# Patient Record
Sex: Female | Born: 1950 | Race: Black or African American | Hispanic: No | Marital: Married | Smoking: Former smoker
Health system: Southern US, Community
[De-identification: ages and names within clinical notes are randomized; demographics above are authoritative.]

## PROBLEM LIST (undated history)

## (undated) DIAGNOSIS — E669 Obesity, unspecified: Secondary | ICD-10-CM

## (undated) DIAGNOSIS — F32A Depression, unspecified: Secondary | ICD-10-CM

## (undated) DIAGNOSIS — R269 Unspecified abnormalities of gait and mobility: Secondary | ICD-10-CM

## (undated) DIAGNOSIS — I119 Hypertensive heart disease without heart failure: Secondary | ICD-10-CM

## (undated) DIAGNOSIS — E785 Hyperlipidemia, unspecified: Secondary | ICD-10-CM

## (undated) DIAGNOSIS — T4145XA Adverse effect of unspecified anesthetic, initial encounter: Secondary | ICD-10-CM

## (undated) DIAGNOSIS — K219 Gastro-esophageal reflux disease without esophagitis: Secondary | ICD-10-CM

## (undated) DIAGNOSIS — T8859XA Other complications of anesthesia, initial encounter: Secondary | ICD-10-CM

## (undated) DIAGNOSIS — G35 Multiple sclerosis: Secondary | ICD-10-CM

## (undated) DIAGNOSIS — I10 Essential (primary) hypertension: Secondary | ICD-10-CM

## (undated) DIAGNOSIS — G8929 Other chronic pain: Secondary | ICD-10-CM

## (undated) DIAGNOSIS — I422 Other hypertrophic cardiomyopathy: Secondary | ICD-10-CM

## (undated) DIAGNOSIS — R600 Localized edema: Secondary | ICD-10-CM

## (undated) DIAGNOSIS — M545 Other chronic pain: Secondary | ICD-10-CM

## (undated) DIAGNOSIS — R609 Edema, unspecified: Secondary | ICD-10-CM

## (undated) DIAGNOSIS — E049 Nontoxic goiter, unspecified: Secondary | ICD-10-CM

## (undated) DIAGNOSIS — J449 Chronic obstructive pulmonary disease, unspecified: Secondary | ICD-10-CM

## (undated) DIAGNOSIS — Z9889 Other specified postprocedural states: Secondary | ICD-10-CM

## (undated) DIAGNOSIS — E039 Hypothyroidism, unspecified: Secondary | ICD-10-CM

## (undated) DIAGNOSIS — F329 Major depressive disorder, single episode, unspecified: Secondary | ICD-10-CM

## (undated) DIAGNOSIS — N319 Neuromuscular dysfunction of bladder, unspecified: Secondary | ICD-10-CM

## (undated) DIAGNOSIS — J189 Pneumonia, unspecified organism: Secondary | ICD-10-CM

## (undated) DIAGNOSIS — I5032 Chronic diastolic (congestive) heart failure: Secondary | ICD-10-CM

## (undated) DIAGNOSIS — M1990 Unspecified osteoarthritis, unspecified site: Secondary | ICD-10-CM

## (undated) DIAGNOSIS — R0600 Dyspnea, unspecified: Secondary | ICD-10-CM

## (undated) HISTORY — DX: Edema, unspecified: R60.9

## (undated) HISTORY — DX: Dyspnea, unspecified: R06.00

## (undated) HISTORY — DX: Obesity, unspecified: E66.9

## (undated) HISTORY — DX: Low back pain: M54.5

## (undated) HISTORY — PX: JOINT REPLACEMENT: SHX530

## (undated) HISTORY — DX: Neuromuscular dysfunction of bladder, unspecified: N31.9

## (undated) HISTORY — DX: Major depressive disorder, single episode, unspecified: F32.9

## (undated) HISTORY — DX: Localized edema: R60.0

## (undated) HISTORY — DX: Hyperlipidemia, unspecified: E78.5

## (undated) HISTORY — DX: Hypothyroidism, unspecified: E03.9

## (undated) HISTORY — DX: Other specified postprocedural states: Z98.890

## (undated) HISTORY — PX: APPENDECTOMY: SHX54

## (undated) HISTORY — DX: Depression, unspecified: F32.A

## (undated) HISTORY — PX: BREAST SURGERY: SHX581

## (undated) HISTORY — DX: Unspecified abnormalities of gait and mobility: R26.9

## (undated) HISTORY — DX: Other chronic pain: M54.50

## (undated) HISTORY — PX: CHOLECYSTECTOMY: SHX55

## (undated) HISTORY — DX: Other chronic pain: G89.29

## (undated) HISTORY — DX: Chronic diastolic (congestive) heart failure: I50.32

---

## 1993-06-06 HISTORY — PX: ARTHOSCOPIC ROTAOR CUFF REPAIR: SHX5002

## 1998-09-14 ENCOUNTER — Other Ambulatory Visit: Admission: RE | Admit: 1998-09-14 | Discharge: 1998-09-14 | Payer: Self-pay | Admitting: Obstetrics and Gynecology

## 1998-11-25 ENCOUNTER — Ambulatory Visit (HOSPITAL_COMMUNITY): Admission: RE | Admit: 1998-11-25 | Discharge: 1998-11-25 | Payer: Self-pay | Admitting: *Deleted

## 1998-11-25 ENCOUNTER — Encounter: Payer: Self-pay | Admitting: *Deleted

## 1998-12-10 ENCOUNTER — Ambulatory Visit (HOSPITAL_COMMUNITY): Admission: RE | Admit: 1998-12-10 | Discharge: 1998-12-10 | Payer: Self-pay | Admitting: *Deleted

## 1998-12-10 ENCOUNTER — Encounter: Payer: Self-pay | Admitting: *Deleted

## 1998-12-24 ENCOUNTER — Ambulatory Visit (HOSPITAL_COMMUNITY): Admission: RE | Admit: 1998-12-24 | Discharge: 1998-12-24 | Payer: Self-pay | Admitting: *Deleted

## 1998-12-24 ENCOUNTER — Encounter: Payer: Self-pay | Admitting: *Deleted

## 1999-01-07 ENCOUNTER — Ambulatory Visit (HOSPITAL_COMMUNITY): Admission: RE | Admit: 1999-01-07 | Discharge: 1999-01-07 | Payer: Self-pay | Admitting: *Deleted

## 1999-01-07 ENCOUNTER — Encounter: Payer: Self-pay | Admitting: *Deleted

## 2001-09-21 ENCOUNTER — Emergency Department (HOSPITAL_COMMUNITY): Admission: EM | Admit: 2001-09-21 | Discharge: 2001-09-21 | Payer: Self-pay | Admitting: Emergency Medicine

## 2003-08-15 ENCOUNTER — Ambulatory Visit (HOSPITAL_COMMUNITY): Admission: RE | Admit: 2003-08-15 | Discharge: 2003-08-15 | Payer: Self-pay | Admitting: *Deleted

## 2003-08-15 ENCOUNTER — Ambulatory Visit (HOSPITAL_BASED_OUTPATIENT_CLINIC_OR_DEPARTMENT_OTHER): Admission: RE | Admit: 2003-08-15 | Discharge: 2003-08-15 | Payer: Self-pay | Admitting: *Deleted

## 2003-08-15 ENCOUNTER — Encounter (INDEPENDENT_AMBULATORY_CARE_PROVIDER_SITE_OTHER): Payer: Self-pay | Admitting: *Deleted

## 2003-12-06 ENCOUNTER — Encounter: Admission: RE | Admit: 2003-12-06 | Discharge: 2003-12-06 | Payer: Self-pay | Admitting: Orthopaedic Surgery

## 2003-12-06 ENCOUNTER — Inpatient Hospital Stay (HOSPITAL_COMMUNITY): Admission: AC | Admit: 2003-12-06 | Discharge: 2003-12-12 | Payer: Self-pay

## 2003-12-09 ENCOUNTER — Encounter (INDEPENDENT_AMBULATORY_CARE_PROVIDER_SITE_OTHER): Payer: Self-pay | Admitting: Cardiology

## 2004-01-01 ENCOUNTER — Encounter: Admission: RE | Admit: 2004-01-01 | Discharge: 2004-03-31 | Payer: Self-pay | Admitting: Cardiology

## 2004-04-23 ENCOUNTER — Encounter (INDEPENDENT_AMBULATORY_CARE_PROVIDER_SITE_OTHER): Payer: Self-pay | Admitting: *Deleted

## 2004-04-23 ENCOUNTER — Ambulatory Visit (HOSPITAL_COMMUNITY): Admission: RE | Admit: 2004-04-23 | Discharge: 2004-04-23 | Payer: Self-pay | Admitting: Obstetrics and Gynecology

## 2004-05-13 ENCOUNTER — Encounter: Admission: RE | Admit: 2004-05-13 | Discharge: 2004-08-11 | Payer: Self-pay | Admitting: Cardiology

## 2004-06-15 ENCOUNTER — Inpatient Hospital Stay (HOSPITAL_COMMUNITY): Admission: AD | Admit: 2004-06-15 | Discharge: 2004-06-15 | Payer: Self-pay | Admitting: Obstetrics and Gynecology

## 2004-07-07 ENCOUNTER — Encounter: Admission: RE | Admit: 2004-07-07 | Discharge: 2004-10-05 | Payer: Self-pay | Admitting: Cardiology

## 2004-10-13 ENCOUNTER — Encounter: Admission: RE | Admit: 2004-10-13 | Discharge: 2005-01-11 | Payer: Self-pay | Admitting: Cardiology

## 2005-03-10 ENCOUNTER — Encounter: Admission: RE | Admit: 2005-03-10 | Discharge: 2005-06-08 | Payer: Self-pay | Admitting: Cardiology

## 2005-11-19 ENCOUNTER — Encounter: Admission: RE | Admit: 2005-11-19 | Discharge: 2005-11-19 | Payer: Self-pay | Admitting: Orthopaedic Surgery

## 2005-12-03 ENCOUNTER — Encounter: Admission: RE | Admit: 2005-12-03 | Discharge: 2005-12-03 | Payer: Self-pay | Admitting: Orthopaedic Surgery

## 2005-12-12 DIAGNOSIS — R0600 Dyspnea, unspecified: Secondary | ICD-10-CM

## 2005-12-12 HISTORY — DX: Dyspnea, unspecified: R06.00

## 2006-01-19 ENCOUNTER — Ambulatory Visit (HOSPITAL_COMMUNITY): Admission: RE | Admit: 2006-01-19 | Discharge: 2006-01-19 | Payer: Self-pay | Admitting: Neurology

## 2006-06-20 ENCOUNTER — Encounter: Admission: RE | Admit: 2006-06-20 | Discharge: 2006-07-13 | Payer: Self-pay | Admitting: Neurology

## 2007-01-11 ENCOUNTER — Ambulatory Visit (HOSPITAL_BASED_OUTPATIENT_CLINIC_OR_DEPARTMENT_OTHER): Admission: RE | Admit: 2007-01-11 | Discharge: 2007-01-11 | Payer: Self-pay | Admitting: Orthopedic Surgery

## 2007-01-11 ENCOUNTER — Encounter (INDEPENDENT_AMBULATORY_CARE_PROVIDER_SITE_OTHER): Payer: Self-pay | Admitting: Orthopedic Surgery

## 2007-01-31 ENCOUNTER — Encounter: Admission: RE | Admit: 2007-01-31 | Discharge: 2007-01-31 | Payer: Self-pay | Admitting: Internal Medicine

## 2007-01-31 ENCOUNTER — Encounter (INDEPENDENT_AMBULATORY_CARE_PROVIDER_SITE_OTHER): Payer: Self-pay | Admitting: Interventional Radiology

## 2007-01-31 ENCOUNTER — Other Ambulatory Visit: Admission: RE | Admit: 2007-01-31 | Discharge: 2007-01-31 | Payer: Self-pay | Admitting: Interventional Radiology

## 2007-03-27 ENCOUNTER — Encounter: Admission: RE | Admit: 2007-03-27 | Discharge: 2007-03-27 | Payer: Self-pay | Admitting: Cardiology

## 2007-04-02 ENCOUNTER — Ambulatory Visit (HOSPITAL_COMMUNITY): Admission: RE | Admit: 2007-04-02 | Discharge: 2007-04-02 | Payer: Self-pay | Admitting: Cardiology

## 2007-04-02 HISTORY — PX: CARDIAC CATHETERIZATION: SHX172

## 2007-10-08 ENCOUNTER — Inpatient Hospital Stay (HOSPITAL_COMMUNITY): Admission: AD | Admit: 2007-10-08 | Discharge: 2007-10-10 | Payer: Self-pay | Admitting: Cardiology

## 2009-08-18 ENCOUNTER — Ambulatory Visit (HOSPITAL_COMMUNITY): Admission: RE | Admit: 2009-08-18 | Discharge: 2009-08-18 | Payer: Self-pay | Admitting: Internal Medicine

## 2010-07-19 ENCOUNTER — Ambulatory Visit
Admission: RE | Admit: 2010-07-19 | Discharge: 2010-07-19 | Disposition: A | Discharge status: Home / Self Care | Payer: 59 | Source: Ambulatory Visit | Attending: Nephrology | Admitting: Nephrology

## 2010-07-19 ENCOUNTER — Other Ambulatory Visit: Payer: Self-pay | Admitting: Nephrology

## 2010-07-19 DIAGNOSIS — M25579 Pain in unspecified ankle and joints of unspecified foot: Secondary | ICD-10-CM

## 2010-10-19 NOTE — Cardiovascular Report (Signed)
[name] Fry, [name] Fry              ACCOUNT NO.:  [id number]   MEDICAL RECORD NO.:  [id number]          PATIENT TYPE:  OIB   LOCATION:  NA                           FACILITY:  [facility]   PHYSICIAN:  Thereasa Solo. Little, M.D. DATE OF BIRTH:  Jun 04, 1951   DATE OF PROCEDURE:  04/02/2007  DATE OF DISCHARGE:                            CARDIAC CATHETERIZATION   This 60 year old hypertensive, obese female with diet controlled  diabetes has continued to complain of episodes of chest discomfort.  Because of these continued complaints, she is brought in for cardiac  catheterization.  Nonselective evaluation of her renal arteries was also  performed because of severe hypertension.   PROCEDURE:  The patient was prepped and draped in the usual sterile  fashion, __________  right groin.  Following local anesthetic with 1%  Xylocaine, the Seldinger technique was employed and a 5-French  introducer sheath was placed in the right femoral artery.  Selective  right and left coronary arteriography, ventriculography in the RAO  projection and nonselective renal angiograms were performed using a  right coronary catheter.   TOTAL CONTRAST:  100 mL.   COMPLICATIONS:  None.   RESULTS:  1. Hemodynamic monitoring:  Central aortic pressure was 179/101.  At      the end the procedure, however, it was 174/76.  Left ventricular      pressure was 174/14 and there was no aortic valve gradient noted at      the time of pullback.  2. Ventriculography.  Ventriculography in the RAO projection using 25      mL of contrast at 12 mL per second was associated with marked      ventricular ectopy.  The ejection fraction was in excess of 60%.      The left ventricular wall thickness appeared to be moderately      increased and the left ventricular end-diastolic pressure was 22.  3. Coronary arteriography, no calcification was seen on fluoroscopy.      a.     Left main normal.  It bifurcated.      b.     LAD:  The LAD extended  down to the apex of the heart, had       two diagonal branches.  This system was free of disease.      c.     Circumflex:  Circumflex has three OM vessels all of which       were free of disease.      d.     Right coronary artery.  This was a small vessel with a bend       in the midportion of the RCA.  In this bending area, there was a       30% area of eccentric narrowing.  The PDA was small.  There was       clearly no critical disease in this vessel.   The left renal artery was normal.  The right renal artery was normal.   CONCLUSION:  1. Left ventricular hypertrophy.  2. Normal left ventricular systolic function.  3. No coronary disease.  4. No renal  artery stenosis.   It should be pointed out that her fasting blood sugar is 175.  She  states she is diet-controlled.  I have asked her to follow back up with  Dr. Ronne Binning for treatment of her diabetes.           ______________________________  Thereasa Solo Little, M.D.     ABL/MEDQ  D:  04/02/2007  T:  04/02/2007  Job:  [id number]   cc:   Dr. Ronne Binning

## 2010-10-19 NOTE — H&P (Signed)
[name] Fry, [name] Fry              ACCOUNT NO.:  [id number]   MEDICAL RECORD NO.:  [id number]          PATIENT TYPE:  INP   LOCATION:  4708                         FACILITY:  [facility]   PHYSICIAN:  Thereasa Solo. Little, M.D. DATE OF BIRTH:  01-27-1951   DATE OF ADMISSION:  10/08/2007  DATE OF DISCHARGE:                              HISTORY & PHYSICAL   HISTORY OF PRESENT ILLNESS:  [name] Fry is 60 year old female followed  by Dr. Clarene Duke with a history of hypertension that has been difficult to  control.  She has been on multiple medications in the past, she has had  some problems with intolerance to several different medications.  She  recently saw Dr. Clarene Duke in the office August 27, 2007 for follow-up of  her blood pressure.  At that time, her pressure was still 190/120.  She  was otherwise asymptomatic.  She said that she did comply with Dr.  Fredirick Maudlin instruction regarding her medications.  At that office visit,  Dr. Clarene Duke increased her Toprol to 100 mg a day, Tekturna to 300 mg a  day, lisinopril to 20 mg b.i.d. and left her Exforge at 10/320.  She was  seen in the office today for follow-up blood pressure check.  She again  denies any chest pain, shortness of breath, or headache.  Her pressure  today is 220/120.  She is admitted now for further evaluation.  She  again indicates that she has been compliant with all medications as  directed.   Her past medical history is remarkable for normal coronaries and normal  LV function with LVH in October of 2008.  There was no renal artery  stenosis at that time.  She has morbid obesity.  She has had some  chronic back pain issues.  She has a history of goiter, and this is  being followed by an endocrinologist.  She has had a previous  hysterectomy and previous ganglion.   CURRENT MEDICATIONS:  1. Toprol XL 100 mg a day.  2. Tekturna 300 mg a day.  3. Lisinopril 20 mg b.i.d.  4. Exforge 10/320 daily.  5. Lasix 40 mg a day.  6. Potassium 20  mEq a day.  7. Albuterol inhaler p.r.n.  8. Arthrotec 75 mg b.i.d.  9. Trazodone 200 mg h.s.  10.Cymbalta 60 mg daily.  11.Azmacort inhaler 2 puffs b.i.d.  12.Aspirin 81 mg a day.  13.Detrol LA 4 mg a day.  14.Flexeril 10 mg t.i.d. p.r.n.  15.Neurontin 300 mg 1 in the morning and 2 at night.  16.Synthroid 0.088 mg alternating with Synthroid 0.112 mg every other      day.  17.Tramadol p.r.n.  18.Tapazole 10 mg a day.   SHE IS ALLERGIC TO PENICILLIN AND INTOLERANT TO CODEINE AND HAS HAD A  REACTION TO PINK ADHESIVE TAPE.   SOCIAL HISTORY:  She is married.  She has three children and five  grandchildren.  She quit smoking in 2006.   FAMILY HISTORY:  Is remarkable that her father died at 71 of an MI.  Her  mother had diabetes and died in her 7s of complications  diabetes and  coronary disease.   REVIEW OF SYSTEMS:  She does have a history of multiple sclerosis  although this appears to be fairly mild.  She uses a cane.   PHYSICAL EXAMINATION:  Blood pressure 222/122, respirations 16, pulse  78.  In general, she is an obese, African-American female in no acute  distress.  HEENT:  Normocephalic.  NECK:  Is without JVD.  She does have a goiter palpable.  CHEST:  Reveals clear lung fields.  CARDIAC EXAM:  Reveals regular rate and rhythm with positive S4.  ABDOMEN:  Is morbidly obese, nontender.  EXTREMITIES:  Without edema.  NEUROLOGIC EXAM:  Is grossly intact.  She is awake, alert and oriented  and cooperative.  Moves all extremities without obvious deficit.  SKIN:  Is warm and dry.   EKG and labs are pending.   IMPRESSION:  1. Hypertension with uncontrolled hypertensive crisis.  2. Normal coronaries and normal left ventricular function by      catheterization in October of 2008.  3. Normal renal arteries October 2008.  4. History of goiter.  5. History of multiple sclerosis.  6. Prior history of substance abuse including cocaine but none in      several years.  7. Past  history of chronic back problems.  8. Remote smoker.   PLAN:  The patient is admitted to telemetry.  Will continue her home  medicines.  Will try a dose of clonidine, although she apparently has  had problems with clonidine in the past.      Abelino Derrick, P.A.    ______________________________  Thereasa Solo. Little, M.D.    Lenard Lance  D:  10/08/2007  T:  10/08/2007  Job:  [id number]

## 2010-10-19 NOTE — Op Note (Signed)
NAMEMAHAILA, Fry              ACCOUNT NO.:  [id number]   MEDICAL RECORD NO.:  [id number]          PATIENT TYPE:  AMB   LOCATION:  DSC                          FACILITY:  [facility]   PHYSICIAN:  Katy Fitch. Sypher, M.D. DATE OF BIRTH:  Dec 29, 1950   DATE OF PROCEDURE:  01/11/2007  DATE OF DISCHARGE:                               OPERATIVE REPORT   PREOPERATIVE DIAGNOSIS:  Volar cystic mass, left wrist.   POSTOPERATIVE DIAGNOSIS:  Volar cystic mass, left wrist with what  appeared to be a subfascial extraretinacular mass extending from the  ulnar artery and nerve on the ulnar aspect of the wrist to the thenar  eminence.   OPERATIONS:  Excision of a complex left volar cystic mass consistent  with a myxoma or complex ganglion.   OPERATIONS:  [name] Fry, M.D.   ASSISTANT:  Molly Maduro Dasnoit PA-C.   ANESTHESIA:  General endotracheal.   SUPERVISING ANESTHESIOLOGIST:  Janetta Hora. Gelene Mink, M.D.   INDICATIONS:  [name] Fry is a 60 year old woman referred through the  courtesy of Dr. Billee Cashing for evaluation of an enlarging mass on  the volar aspect of her left wrist.  This had the features of a probable  complex ganglion or myxoma.  Due to its presence in the region of the  median nerve, flexor tendons, ulnar nerve and artery and thenar  eminence, I obtained a preoperative MRI.  [name] Fry was noted to have  an occult ganglion cyst in the radial aspect of the lunate adjacent to  the scapholunate interosseous ligament that did not appear to  communicate with the mass.  She had some degenerative change at the base  of her thumb, but no direct communication to the cystic mass.  The mass  appeared to be superficial to the flexor retinaculum, but extended to  the ulnar artery and nerve.   She is brought to the operating at this time anticipating exploration  and excision of the mass.   DESCRIPTION OF PROCEDURE:  [name] Fry is brought to the operating  room and placed in the  supine position on the operating table.   Following anesthesia consult with Dr. Gelene Mink, general anesthesia by  endotracheal technique was recommended and accepted.   [name] Fry was brought to room eight, placed in the supine position on the  operating table and under Dr. Thornton Dales direct supervision general  endotracheal anesthesia induced.   The left arm was prepped with Betadine soap solution and sterilely  draped.  A pneumatic tourniquet was applied to the proximal left  brachium.  Following exsanguination of left arm with an Esmarch bandage,  the  arterial tourniquet was inflated to 270 mmHg.   Procedure commenced with a Brunner zigzag incision exposing the mass  from proximal to distal.  The subcutaneous tissues were carefully  divided taking care to gently dissect the mass away from the ulnar  artery and nerve.  This was adherent to the palmaris longus tendon, yet  superficial to the flexor retinaculum.   The palmar cutaneous branch of the median nerve was identified and  gently dissected off the mass.  The mass was  circumferentially  dissected, drained of its contents and removed from the flexor  retinaculum.   Bleeding points were electrocauterized with bipolar current.  The wound  was carefully palpated looking for extensions.  I could not identify an  extension deep to the thenar musculature.   The wound was then repaired with corner sutures of 4-0 Vicryl and  intradermal 3-0 Prolene segmental sutures.  Steri-Strips were applied.  There were no apparent complications.   For aftercare [name] Fry is provided prescription for Vicodin 5 mg 1  p.o. q.4-6h. p.r.n. pain 20 tablets without refill.      Katy Fitch Sypher, M.D.  Electronically Signed     RVS/MEDQ  D:  01/11/2007  T:  01/11/2007  Job:  [id number]   cc:   Lorelle Formosa, M.D.  Katy Fitch Sypher, M.D.

## 2010-10-19 NOTE — Discharge Summary (Signed)
[name] Fry, [name] Fry              ACCOUNT NO.:  [id number]   MEDICAL RECORD NO.:  [id number]          PATIENT TYPE:  INP   LOCATION:  4708                         FACILITY:  [facility]   PHYSICIAN:  Darcella Gasman. Ingold, N.P.  DATE OF BIRTH:  1950-12-13   DATE OF ADMISSION:  10/08/2007  DATE OF DISCHARGE:  10/10/2007                               DISCHARGE SUMMARY   DISCHARGE DIAGNOSES:  1. Uncontrolled hypertension.  2. Diabetes mellitus type 2 with hyperglycemia.  3. Thyroid goiter, followed by Dr. Chestine Spore.  4. Morbid obesity.  5. History of normal coronary arteries and normal left ventricle      function October 2008.  6. Ex-smoker.  7. Multiple sclerosis followed by Dr. Anne Hahn.   DISCHARGE CONDITION:  Improved.   PROCEDURES:  None.   DISCHARGE MEDICATIONS:  1. Neurontin 300 mg 1 in the morning 2 in the evening.  2. Provigil 200 mg daily.  3. Toprol-XL 100 mg daily.  4. Tekturna 300 mg daily.  5. Lisinopril 20 mg 1 twice a day.  6. Furosemide 40 mg daily.  7. K-Dur 20 mEq daily.  8. Trazodone 200 mg at bedtime.  9. Cymbalta 60 mg daily.  10.Aspirin 81 mg daily.  11.Detrol 4 mg daily.  12.Tapazole 10 mg daily.  13.Synthroid 88 mcg alternating with 112 mcg every other day.  14.Exforge 10/320 one daily.  15.BiDil 20 mg/37.5mg  one tablet 3 times a day.  16.Flovent inhaler or Azmacort, 1 puff on Flovent twice a day or 2      puffs on Azmacort twice a day.  17.Albuterol inhaler as needed as before.  18.Flexeril 10 mg as before.  19.Tramadol 50 mg every 8 hours as needed as before.  20.Arthrotec 75 mg 1 twice a day.  21.Valium 5 mg as needed as before.   DISCHARGE INSTRUCTIONS:  1. Low-sodium heart-healthy diabetic diet.  2. Activity without restrictions.  3. Follow up with Dr. Clarene Duke Oct 26, 2007, at 3:00 p.m.  4. Follow up with Dr. Chestine Spore on Friday as previously scheduled for      endocrine including diabetes.   HISTORY OF PRESENT ILLNESS:  A 60 year old patient of Dr.  Fredirick Maudlin with  uncontrolled hypertension was seen in the office by EMS August 27, 2007,  with blood pressure of 222/120.  Medicines were adjusted, and she was  brought back today and in the office, her blood pressure was again 120  diastolic.  She denied chest pain, shortness of breath, or headache.  She states she has been strictly compliant with Dr. Fredirick Maudlin medication  instructions.  We admitted her to [facility] for further evaluation.  Outpatient meds are essentially the same.  We only added BiDil to her  treatment here in the hospital.   ALLERGIES:  PENICILLIN, CODEINE, and TAPE.   FAMILY HISTORY, SOCIAL HISTORY, REVIEW OF SYSTEMS:  See H&P.   PHYSICAL EXAMINATION ON DISCHARGE:  VITAL SIGNS:  Blood pressure 122/77,  pulse 60, respirations stable, and temperature 97.5.  CHEST:  Lungs are clear.  HEART:  Regular rate and rhythm on the monitor shows sinus rhythm.   LABORATORY DATA:  Hemoglobin 13.2, hematocrit 39.1, WBC 9.9, platelets  107, and MCV 93.6.  Chemistry; sodium 139, potassium 3.7, chloride 103,  CO2 29, BUN 5, creatinine 0.79, glucose range 259 to 177, protime 12.8,  INR 0.9, PTT 27, AST 25, ALT 27, alkaline phos 79, total bili 0.6,  albumin 3.3, calcium 8.9, magnesium 2.0, and TSH was 0.894.  Accu-Cheks  here ranged 274 to 183.   HOSPITAL COURSE:  [name] Fry was admitted due to uncontrolled  hypertension when she was seen in the office.  She was admitted and  started on BiDil with improvement of her blood pressure and it was  controlled at discharge.  Question compliant, she states she is  compliant.  We will attempt to see if the interim care can go out and  help her with her meds also, her pharmacy do a calendar to see if that  will help her.  She is on multiple medications and maybe she forgets  medications at times.  She will followup here.  Her glucose was poorly  controlled as well.  She is to see Dr. Chestine Spore, her endocrinologist on  Friday, and he will  address her diabetes as well as her thyroid issues.  We have asked her to maintain a diabetic diet.  The patient was seen and  examined on Oct 10, 2007, by Dr. Lynnea Ferrier and was decided ready for  discharge home.      Darcella Gasman. Annie Paras, N.P.     LRI/MEDQ  D:  10/10/2007  T:  10/11/2007  Job:  [id number]   cc:   Margaretmary Bayley, M.D.  Thereasa Solo. Little, M.D.

## 2010-10-20 ENCOUNTER — Other Ambulatory Visit: Payer: Self-pay | Admitting: Internal Medicine

## 2010-10-20 DIAGNOSIS — E042 Nontoxic multinodular goiter: Secondary | ICD-10-CM

## 2010-10-22 NOTE — Op Note (Signed)
[name] Fry, [name] Fry              ACCOUNT NO.:  [id number]   MEDICAL RECORD NO.:  [id number]          PATIENT TYPE:  AMB   LOCATION:  SDC                           FACILITY:  [facility]   PHYSICIAN:  Lenoard Aden, M.D.DATE OF BIRTH:  Oct 18, 1950   DATE OF PROCEDURE:  04/23/2004  DATE OF DISCHARGE:                                 OPERATIVE REPORT   PREOPERATIVE DIAGNOSIS:  Postmenopausal bleeding.   POSTOPERATIVE DIAGNOSIS:  Multiple endometrial polyps.   PROCEDURE:  Diagnostic hysteroscopy, resectoscopic polypectomy, D&C.   SURGEON:  Lenoard Aden, M.D.   ANESTHESIA:  General.   ESTIMATED BLOOD LOSS:  Less than 50 mL.   COMPLICATIONS:  None.   DRAINS:  None.   COUNTS:  Correct.   The patient to recovery in good condition.  Fluid deficit 60 mL.   BRIEF OPERATIVE NOTE:  After being apprised of the risks of anesthesia,  infection, bleeding, injury to abdominal organs with need for repair, the  patient was brought to the operating room where she was administered general  anesthesia without complications, prepped and draped in the usual sterile  fashion, catheterized until the bladder is empty.  After achieving adequate  anesthesia, a single tooth tenaculum placed on the anterior lip of the  cervix, the cervix easily dilated up to a 31 Pratt dilator, hysteroscope  placed.  Visualization reveals multiple endometrial polyps which were  resected using a double right angle loop without difficulty.  Good  hemostasis is noted.  D&C performed with sharp curettings.  Revisualization  reveals resection of multiple polyps.  At this time, minimal bleeding is  noted.  All instruments are removed.  The patient is awakened and then  transferred to recovery in good condition.      RJT/MEDQ  D:  04/23/2004  T:  04/23/2004  Job:  [id number]

## 2010-10-22 NOTE — Discharge Summary (Signed)
[name] Fry, [name] Fry                          ACCOUNT NO.:  [id number]   MEDICAL RECORD NO.:  [id number]                   PATIENT TYPE:  INP   LOCATION:  3739                                 FACILITY:  [facility]   PHYSICIAN:  Thereasa Solo. Little, M.D.              DATE OF BIRTH:  1951-01-06   DATE OF ADMISSION:  12/06/2003  DATE OF DISCHARGE:  12/12/2003                                 DISCHARGE SUMMARY   DISCHARGE DIAGNOSES:  1. Status post hypertensive crisis, resolved.  2. Status post acute respiratory arrest with pulmonary edema, resolved.  3. Positive urine drug screen for cocaine, opioids, and marijuana.  4. Abnormal cardiac enzymes on admission.  5. Chronic back pain.  6. New onset adult diabetes mellitus.  7. Goiter.  8. Chronic obstructive pulmonary disease with ongoing tobacco use.  9. Episode of elevated liver function tests, returned to normal at time of     discharge.  10.      Hypokalemia, resolved.   HISTORY OF PRESENT ILLNESS/HOSPITAL COURSE:  This is 60 year old African  American lady who was at [facility] having MRI for chronic back pain and at that  time became very anxious, short of breath.  She was medicated with valium  for a possible anxiety but still was unable to lie down flat with extreme  shortness of breath.  Pulmonary edema was suspected, and the patient was  transferred by EMS to [facility], and our group was called for  evaluation of the patient.  She was given Lasix IV 80 mg and was started on  nitroglycerin IV.  Her blood pressure, at the time of admission, was  240/146.  On arrival in the emergency room, the patient was intubated due to  extreme shortness of breath and oxygen saturation drop.  She responded  nicely to all the IV medications, and her blood pressure dropped within the  first hour to 90 systolic.  A pulmonary consult was called, and the patient  was intubated and a right internal jugular vein catheter was placed.  The  next day her  blood pressure was 110/80, heart rate 80, temperature 98.6.  White blood cell count showed 22.4, hemoglobin was 13.7.  Chest x-ray, from  admission, showed pulmonary edema.  Her drugs screen revealed positive  opioids and cocaine, and it was suspected the vent-dependent respiratory  failure was actually induced by these illicit drugs.  The patient denied any  illicit drug use.  The leukocytosis was suspected to be reactive, and the  patient was afebrile and did not appear to be toxic.  But her hemoglobin A1c  was elevated to 7.6 and serum glucose showed 241, so she was put on sliding  scale insulin.  A 2D echo was performed, on December 09, 2003, and it showed left  ventricular ejection fraction of 55-65% and moderately increased thickness  of the left ventricular wall.  The aortic valve was  __________  and normal.  There were no abnormalities seen in the mitral valve also.  But there was an  increased relative contribution of atrial contraction to left ventricular  filling.  Her liver function tests, at the time of admission, were mildly  elevated.  SGOT was 53 and SGPT 42.  The highest value of serum glucose was  357 and a nutrition consult was ordered and it was recommended the patient  place on low carb diet with sufficient nutritional supplements.  Blood  culture, urine culture, and respiratory culture revealed negative results.  Hepatic function test, on December 09, 2003, showed normal values with SGOT 19  and SGPT 24.  Hemoglobin A1c was 7.6.  Cardiac panel revealed elevated  creatinine kinase and mildly elevated troponin with normal MB and this  troponin could be related to pulmonary edema.  Drug of abuse screen, from  December 11, 2003, showed positive marijuana metabolites.  On December 10, 2003, the  patient's telemetry showed six beats of nonsustained VT, and the patient was  asymptomatic at that time.  At the day of her discharge, she denied any  chest pain, and reported improved breathing, and  was ambulating with the  cardiac rehab on the ward without significant difficulties.  The patient was  assessed by Dr. Jenne Campus and was considered stable for discharge home.   DISCHARGE DIAGNOSES:  1. Status post acute respiratory arrest, likely drug induced.  2. Status post vent-dependent respiratory failure.  3. Newly diagnosed adult onset diabetes mellitus.  4. Status post hypertensive crisis, resolved.  5. Polysubstance abuse, drug screen positive for cocaine and opiates and     marijuana.  6. Chronic back pain.  7. Chronic obstructive pulmonary disease with history of asthma and ongoing     tobacco use.  8. Morbid obesity.  9. Status post acute pulmonary edema, resolved.  10.      Status post elevated liver function tests, resolved.  11.      Status post hypokalemia, resolved.  12.      History of goiter.  13.      Abnormal cardiac enzymes this admission likely secondary to     pulmonary edema.  14.      Status post nonsustained ventricular tachycardia, asymptomatic, no     recurrence.   MEDICATIONS:  1. Lotrel 10/20 mg every day.  2. HCTZ 12.5 mg every day.  3. Potassium chloride 20 mEq every day.   ACTIVITY:  As tolerated.   DIET:  Low-fat, low-carbohydrate, diabetic diet.   SPECIAL INSTRUCTIONS:  The patient was advised smoking cessation and  cessation of illicit drug use.   FOLLOWUP APPOINTMENTS:  She will need a followup appointment with Dr.  __________  for management of diabetes and follow up with primary M.D. for  blood pressure control and also she will be seen by Dr. Clarene Duke in our office  in followup.   She may also require rehabilitation therapy for polysubstance abuse,  although she denies any use of illicit drugs.      Raymon Mutton, P.A.                    Thereasa Solo. Little, M.D.    MK/MEDQ  D:  12/12/2003  T:  12/13/2003  Job:  [id number]   cc:   Thereasa Solo. Little, M.D.  [address]  [address]  [place]  [place] [address] Fax: [phone]

## 2010-10-22 NOTE — Op Note (Signed)
NAME:  [name] Fry, [name] Fry                        ACCOUNT NO.:  [id number]   MEDICAL RECORD NO.:  [id number]                   PATIENT TYPE:  AMB   LOCATION:  DSC                                  FACILITY:  [facility]   PHYSICIAN:  Lowell Bouton, M.D.      DATE OF BIRTH:  08-12-1950   DATE OF PROCEDURE:  08/15/2003  DATE OF DISCHARGE:  08/15/2003                                 OPERATIVE REPORT   PREOPERATIVE DIAGNOSIS:  Volar ganglion, left wrist.   POSTOPERATIVE DIAGNOSIS:  Volar ganglion, left wrist.   PROCEDURE:  Excision of volar ganglion, left wrist.   SURGEON:  Lowell Bouton, M.D.   ANESTHESIA:  General.   OPERATIVE FINDINGS:  The patient had a large ganglion that was volar to the  radial artery.  It appeared to arise from the tendon sheath of the first  dorsal compartment.   DESCRIPTION OF PROCEDURE:  Under general anesthesia with a tourniquet on the  left arm, the left hand was prepped and draped in the usual fashion.  After  exsanguinating the limb, the tourniquet was inflated to 250 mmHg.  A  longitudinal incision was made over the radial artery overlying the cyst.  Sharp dissection was carried through the subcutaneous tissues, and bleeding  points were coagulated.  Blunt dissection was carried down to the cyst, and  it was dissected out bluntly.  It appeared to arise from the first dorsal  compartment, and a portion of this was released.  The cyst was bluntly  dissected away from the artery and was not grossly adherent.  After removing  the cyst, the wound was irrigated with saline, and the tourniquet was  released.  There was no bleeding from the vessel, and so a vessel loop drain  was left in for drainage.  The subcutaneous tissue was closed with 4-0  Vicryl and the skin with a 3-0 subcuticular Prolene.  Steri-Strips were  applied followed by a sterile dressing and a volar wrist splint.  The  patient tolerated the procedure well and went to the  recovery room awake,  stable, and in good condition.                                               Lowell Bouton, M.D.    EMM/MEDQ  D:  08/15/2003  T:  08/17/2003  Job:  [id number]

## 2010-10-22 NOTE — H&P (Signed)
**Note [name] Fry** [name], [name]              ACCOUNT NO.:  [id number]   MEDICAL RECORD NO.:  [id number]          PATIENT TYPE:  AMB   LOCATION:  SDC                           FACILITY:  [facility]   PHYSICIAN:  Lenoard Aden, M.D.DATE OF BIRTH:  November 09, 1950   DATE OF ADMISSION:  04/23/2004  DATE OF DISCHARGE:                                HISTORY & PHYSICAL   CHIEF COMPLAINT:  Post menopausal bleeding.   HISTORY OF PRESENT ILLNESS:  The patient is a 60 year old African-American  female, G3, P3, who presents with structural lesion on saline  sonohysterography and post menopausal bleeding. She has multiple medications  to include Arthrotec, potassium, hydrochlorothiazide, Lotrel, trazodone,  albuterol, diazepam, Azmacort, and Toprol. She has allergies to codeine and  penicillin.   She has a history of 3 previous Cesarean sections. She has history of  unspecified hospitalizations for wrist surgery in 2005. She has had  noncontributory family history. She also has medical history remarkable for  hypertension, ulcer disease, thyroid disease, and asthma.   PHYSICAL EXAMINATION:  GENERAL:  She is an obese, African-American female in  no acute distress.  HEENT:  Normal.  LUNGS:  Clear.  HEART:  Regular rate and rhythm.  ABDOMEN:  Soft, obese, nontender.  PELVIC:  Reveals an anteflexed uterus and no adnexal masses. Small  intramural fibroids are noted and questionable endometrial lesion. Bilateral  normal ovaries.  EXTREMITIES:  Reveal no coarse neurological exam, is nonfocal.   IMPRESSION:  Post menopausal bleeding with questionable structural lesion.   PLAN:  Proceed with diagnostic hysteroscopy resectoscope. Risks of  anesthesia, infection, bleeding, intra-abdominal ___________________  complications to include bowel and bladder injury. The patient acknowledges  and wishes to proceed.      RJT/MEDQ  D:  04/22/2004  T:  04/23/2004  Job:  [id number]

## 2010-10-28 ENCOUNTER — Ambulatory Visit (HOSPITAL_COMMUNITY)
Admission: RE | Admit: 2010-10-28 | Discharge: 2010-10-28 | Disposition: A | Discharge status: Home / Self Care | Payer: 59 | Source: Ambulatory Visit | Attending: Internal Medicine | Admitting: Internal Medicine

## 2010-10-28 DIAGNOSIS — E042 Nontoxic multinodular goiter: Secondary | ICD-10-CM | POA: Insufficient documentation

## 2011-01-14 ENCOUNTER — Other Ambulatory Visit: Payer: Self-pay | Admitting: Nephrology

## 2011-01-14 ENCOUNTER — Ambulatory Visit
Admission: RE | Admit: 2011-01-14 | Discharge: 2011-01-14 | Disposition: A | Discharge status: Home / Self Care | Payer: 59 | Source: Ambulatory Visit | Attending: Nephrology | Admitting: Nephrology

## 2011-01-14 DIAGNOSIS — R05 Cough: Secondary | ICD-10-CM

## 2011-03-16 LAB — CBC
HCT: 38.9
Hemoglobin: 13.2
MCV: 93.2
RBC: 4.17
WBC: 10.3

## 2011-03-16 LAB — PLATELET COUNT: Platelets: 124 — ABNORMAL LOW

## 2011-03-21 LAB — BASIC METABOLIC PANEL
CO2: 29
Chloride: 99
Creatinine, Ser: 0.77
GFR calc non Af Amer: >60

## 2011-03-21 LAB — POCT HEMOGLOBIN-HEMACUE: Operator id: 112821

## 2011-04-21 DIAGNOSIS — M79609 Pain in unspecified limb: Secondary | ICD-10-CM | POA: Insufficient documentation

## 2011-04-21 DIAGNOSIS — Z5181 Encounter for therapeutic drug level monitoring: Secondary | ICD-10-CM | POA: Insufficient documentation

## 2011-04-21 DIAGNOSIS — R269 Unspecified abnormalities of gait and mobility: Secondary | ICD-10-CM | POA: Insufficient documentation

## 2011-10-19 ENCOUNTER — Emergency Department (HOSPITAL_COMMUNITY)
Admission: EM | Admit: 2011-10-19 | Discharge: 2011-10-19 | Disposition: A | Discharge status: Nursing Home (Includes ICF) | Payer: 59 | Source: Home / Self Care | Attending: Family Medicine | Admitting: Family Medicine

## 2011-10-19 ENCOUNTER — Emergency Department (HOSPITAL_COMMUNITY): Payer: 59

## 2011-10-19 ENCOUNTER — Emergency Department (INDEPENDENT_AMBULATORY_CARE_PROVIDER_SITE_OTHER): Payer: 59

## 2011-10-19 ENCOUNTER — Observation Stay (HOSPITAL_COMMUNITY)
Admission: EM | Admit: 2011-10-19 | Discharge: 2011-10-23 | DRG: 194 | Disposition: A | Discharge status: Home / Self Care | Payer: 59 | Source: Ambulatory Visit | Attending: Internal Medicine | Admitting: Internal Medicine

## 2011-10-19 ENCOUNTER — Encounter (HOSPITAL_COMMUNITY): Payer: Self-pay | Admitting: *Deleted

## 2011-10-19 ENCOUNTER — Encounter (HOSPITAL_COMMUNITY): Payer: Self-pay | Admitting: Emergency Medicine

## 2011-10-19 DIAGNOSIS — I472 Ventricular tachycardia, unspecified: Secondary | ICD-10-CM | POA: Diagnosis present

## 2011-10-19 DIAGNOSIS — J189 Pneumonia, unspecified organism: Principal | ICD-10-CM | POA: Diagnosis present

## 2011-10-19 DIAGNOSIS — E785 Hyperlipidemia, unspecified: Secondary | ICD-10-CM | POA: Diagnosis present

## 2011-10-19 DIAGNOSIS — Z91199 Patient's noncompliance with other medical treatment and regimen due to unspecified reason: Secondary | ICD-10-CM

## 2011-10-19 DIAGNOSIS — E1165 Type 2 diabetes mellitus with hyperglycemia: Secondary | ICD-10-CM | POA: Diagnosis present

## 2011-10-19 DIAGNOSIS — I10 Essential (primary) hypertension: Secondary | ICD-10-CM

## 2011-10-19 DIAGNOSIS — E139 Other specified diabetes mellitus without complications: Secondary | ICD-10-CM

## 2011-10-19 DIAGNOSIS — G35 Multiple sclerosis: Secondary | ICD-10-CM | POA: Diagnosis present

## 2011-10-19 DIAGNOSIS — E876 Hypokalemia: Secondary | ICD-10-CM | POA: Diagnosis present

## 2011-10-19 DIAGNOSIS — I4729 Other ventricular tachycardia: Secondary | ICD-10-CM | POA: Diagnosis present

## 2011-10-19 DIAGNOSIS — J209 Acute bronchitis, unspecified: Secondary | ICD-10-CM

## 2011-10-19 DIAGNOSIS — R5381 Other malaise: Secondary | ICD-10-CM | POA: Diagnosis present

## 2011-10-19 DIAGNOSIS — Z9119 Patient's noncompliance with other medical treatment and regimen: Secondary | ICD-10-CM

## 2011-10-19 DIAGNOSIS — E119 Type 2 diabetes mellitus without complications: Secondary | ICD-10-CM | POA: Diagnosis present

## 2011-10-19 DIAGNOSIS — J4489 Other specified chronic obstructive pulmonary disease: Secondary | ICD-10-CM | POA: Diagnosis present

## 2011-10-19 DIAGNOSIS — J449 Chronic obstructive pulmonary disease, unspecified: Secondary | ICD-10-CM | POA: Diagnosis present

## 2011-10-19 HISTORY — DX: Essential (primary) hypertension: I10

## 2011-10-19 HISTORY — DX: Multiple sclerosis: G35

## 2011-10-19 HISTORY — DX: Chronic obstructive pulmonary disease, unspecified: J44.9

## 2011-10-19 LAB — CBC
HCT: 43.2 % (ref 36.0–46.0)
MCH: 31.3 pg (ref 26.0–34.0)
MCHC: 34 g/dL (ref 30.0–36.0)
MCV: 91.9 fL (ref 78.0–100.0)
RDW: 14.4 % (ref 11.5–15.5)

## 2011-10-19 LAB — DIFFERENTIAL
Basophils Absolute: 0 10*3/uL (ref 0.0–0.1)
Basophils Relative: 0 % (ref 0–1)
Eosinophils Absolute: 0.3 10*3/uL (ref 0.0–0.7)
Lymphs Abs: 3.5 10*3/uL (ref 0.7–4.0)
Monocytes Absolute: 0.9 10*3/uL (ref 0.1–1.0)
Neutro Abs: 8.8 10*3/uL — ABNORMAL HIGH (ref 1.7–7.7)

## 2011-10-19 LAB — POCT I-STAT, CHEM 8
BUN: 14 mg/dL (ref 6–23)
Calcium, Ion: 1.19 mmol/L (ref 1.12–1.32)
Creatinine, Ser: 0.9 mg/dL (ref 0.50–1.10)
Glucose, Bld: 254 mg/dL — ABNORMAL HIGH (ref 70–99)
TCO2: 28 mmol/L (ref 0–100)

## 2011-10-19 LAB — TROPONIN I: Troponin I: <0.3 ng/mL (ref <0.30)

## 2011-10-19 MED ORDER — MOXIFLOXACIN HCL 400 MG PO TABS
400.0000 mg | ORAL_TABLET | Freq: Once | ORAL | Status: AC
  Administered 2011-10-20: 400 mg via ORAL
  Filled 2011-10-19: qty 1

## 2011-10-19 MED ORDER — SODIUM CHLORIDE 0.9 % IV BOLUS (SEPSIS)
1000.0000 mL | Freq: Once | INTRAVENOUS | Status: AC
  Administered 2011-10-19: 1000 mL via INTRAVENOUS

## 2011-10-19 MED ORDER — LABETALOL HCL 5 MG/ML IV SOLN
20.0000 mg | Freq: Once | INTRAVENOUS | Status: AC
  Administered 2011-10-20: 20 mg via INTRAVENOUS
  Filled 2011-10-19: qty 4

## 2011-10-19 MED ORDER — IOHEXOL 350 MG/ML SOLN
100.0000 mL | Freq: Once | INTRAVENOUS | Status: AC | PRN
  Administered 2011-10-19: 100 mL via INTRAVENOUS

## 2011-10-19 NOTE — ED Notes (Signed)
PT  REPORTS  SYMPTOMS  OF  COUGH  /  CONGESTED         X  10  DAYS      SHE    REPORTS  SHE  WAS  SENT  BY  DR LITTLE    FOR  EVAUL   OF  COUGH  SHE  IS  AWAKE  AND  ALERT AND  ORIENTED  SPEAKING IN  COMPLETE  SENTANCES   SITTING UP ON EXAM TABLE  IN NO  SEVERE   DISTRESS

## 2011-10-19 NOTE — ED Provider Notes (Signed)
History     CSN: [id number]  Arrival date & time 10/19/11  1820   First MD Initiated Contact with Patient 10/19/11 2128      Chief Complaint  Patient presents with  . Bronchitis    (Consider location/radiation/quality/duration/timing/severity/associated sxs/prior treatment) HPI  61 year old female with history of hypertension, asthma, COPD presents complaining of cough. Pt sts her sxs started a week ago.  Onset gradual, with associated blood-tinged sputum for the past 2 days. Symptoms associated with nausea, dry heaves, chills, and decreased appetite. Patient also complaining of dyspnea on exertion, and associated chest tightness. She denies any precipitating factors. She denies denies runny nose, sneezing, sore throat, abd pain, back pain, urinary sxs, numbness or weakness. Denies any recent surgery, prolonged bed rest, recent travel, calf pain, or leg swelling. She is a nonsmoker. Patient states she has been taking her medication as prescribed. Patient also has a history of diabetes but has not taken medication for today. Reports that she checks her blood pressure on the radial basis and is usually within normal range.  Past Medical History  Diagnosis Date  . Diabetes mellitus   . Hypertension   . Asthma   . COPD (chronic obstructive pulmonary disease)     History reviewed. No pertinent past surgical history.  No family history on file.  History  Substance Use Topics  . Smoking status: Not on file  . Smokeless tobacco: Not on file  . Alcohol Use: No    OB History    Grav Para Term Preterm Abortions TAB SAB Ect Mult Living                  Review of Systems  All other systems reviewed and are negative.    Allergies  Codeine and Penicillins  Home Medications   Current Outpatient Rx  Name Route Sig Dispense Refill  . ALISKIREN FUMARATE 300 MG PO TABS Oral Take 300 mg by mouth daily.    Marland Kitchen AMLODIPINE BESYLATE 10 MG PO TABS Oral Take 10 mg by mouth daily.    .  ATORVASTATIN CALCIUM 20 MG PO TABS Oral Take 20 mg by mouth daily.    . FUROSEMIDE 40 MG PO TABS Oral Take 40 mg by mouth daily.    . ISOSORB DINITRATE-HYDRALAZINE 20-37.5 MG PO TABS Oral Take 1 tablet by mouth 3 (three) times daily.    Marland Kitchen LISINOPRIL 20 MG PO TABS Oral Take 40 mg by mouth daily.    Marland Kitchen LOSARTAN POTASSIUM 100 MG PO TABS Oral Take 100 mg by mouth daily.    Marland Kitchen METOPROLOL SUCCINATE ER 50 MG PO TB24 Oral Take 50 mg by mouth daily. Take with or immediately following a meal.    . NEBIVOLOL HCL 10 MG PO TABS Oral Take 10 mg by mouth 2 (two) times daily.    Marland Kitchen POTASSIUM CHLORIDE CRYS ER 20 MEQ PO TBCR Oral Take 20 mEq by mouth daily.      BP 159/102  Pulse 103  Temp(Src) 98.5 F (36.9 C) (Oral)  Resp 20  SpO2 100%  Physical Exam  Nursing note and vitals reviewed. Constitutional: She appears well-developed and well-nourished. No distress.       Awake, alert, nontoxic appearance  HENT:  Head: Atraumatic.  Right Ear: External ear normal.  Left Ear: External ear normal.  Mouth/Throat: Oropharynx is clear and moist. No oropharyngeal exudate.  Eyes: Conjunctivae are normal. Right eye exhibits no discharge. Left eye exhibits no discharge.  Neck: Neck supple. No tracheal  deviation present.  Cardiovascular: Normal rate and regular rhythm.   Pulmonary/Chest: Effort normal. No stridor. No respiratory distress. She exhibits no tenderness.  Abdominal: Soft. There is no tenderness. There is no rebound.  Musculoskeletal: Normal range of motion. She exhibits no edema and no tenderness.       ROM appears intact, no obvious focal weakness  Lymphadenopathy:    She has no cervical adenopathy.  Neurological:       Mental status and motor strength appears intact  Skin: No rash noted.  Psychiatric: She has a normal mood and affect.    ED Course  Procedures (including critical care time)  Labs Reviewed - No data to display Dg Chest 2 View  10/19/2011  *RADIOLOGY REPORT*  Clinical Data: Cough  for 9 days  CHEST - 2 VIEW  Comparison: Chest x-ray of 01/14/2011  Findings: The lungs are clear.  Cardiomegaly is stable.  There is no evidence of congestive heart failure.  There are diffuse degenerative changes throughout the thoracic spine.  IMPRESSION: Cardiomegaly.  No active lung disease.  Original Report Authenticated By: Juline Patch, M.D.     No diagnosis found.  Results for orders placed during the hospital encounter of 10/19/11  POCT I-STAT, CHEM 8      Component Value Range   Sodium 140  135 - 145 (mEq/L)   Potassium 3.6  3.5 - 5.1 (mEq/L)   Chloride 104  96 - 112 (mEq/L)   BUN 14  6 - 23 (mg/dL)   Creatinine, Ser 1.19  0.50 - 1.10 (mg/dL)   Glucose, Bld 147 (*) 70 - 99 (mg/dL)   Calcium, Ion 8.29  5.62 - 1.32 (mmol/L)   TCO2 28  0 - 100 (mmol/L)   Hemoglobin 16.7 (*) 12.0 - 15.0 (g/dL)   HCT 13.0 (*) 86.5 - 46.0 (%)   Dg Chest 2 View  10/19/2011  *RADIOLOGY REPORT*  Clinical Data: Cough for 9 days  CHEST - 2 VIEW  Comparison: Chest x-ray of 01/14/2011  Findings: The lungs are clear.  Cardiomegaly is stable.  There is no evidence of congestive heart failure.  There are diffuse degenerative changes throughout the thoracic spine.  IMPRESSION: Cardiomegaly.  No active lung disease.  Original Report Authenticated By: Juline Patch, M.D.   Results for orders placed during the hospital encounter of 10/19/11  TROPONIN I      Component Value Range   Troponin I <0.30  <0.30 (ng/mL)  CBC      Component Value Range   WBC 13.5 (*) 4.0 - 10.5 (K/uL)   RBC 4.70  3.87 - 5.11 (MIL/uL)   Hemoglobin 14.7  12.0 - 15.0 (g/dL)   HCT 78.4  69.6 - 29.5 (%)   MCV 91.9  78.0 - 100.0 (fL)   MCH 31.3  26.0 - 34.0 (pg)   MCHC 34.0  30.0 - 36.0 (g/dL)   RDW 28.4  13.2 - 44.0 (%)   Platelets 155  150 - 400 (K/uL)  DIFFERENTIAL      Component Value Range   Neutrophils Relative 65  43 - 77 (%)   Lymphocytes Relative 26  12 - 46 (%)   Monocytes Relative 7  3 - 12 (%)   Eosinophils Relative 2   0 - 5 (%)   Basophils Relative 0  0 - 1 (%)   Neutro Abs 8.8 (*) 1.7 - 7.7 (K/uL)   Lymphs Abs 3.5  0.7 - 4.0 (K/uL)   Monocytes Absolute 0.9  0.1 - 1.0 (K/uL)   Eosinophils Absolute 0.3  0.0 - 0.7 (K/uL)   Basophils Absolute 0.0  0.0 - 0.1 (K/uL)   WBC Morphology ATYPICAL LYMPHOCYTES     Smear Review LARGE PLATELETS PRESENT    GLUCOSE, CAPILLARY      Component Value Range   Glucose-Capillary 207 (*) 70 - 99 (mg/dL)   Dg Chest 2 View  1/61/0960  *RADIOLOGY REPORT*  Clinical Data: Cough for 9 days  CHEST - 2 VIEW  Comparison: Chest x-ray of 01/14/2011  Findings: The lungs are clear.  Cardiomegaly is stable.  There is no evidence of congestive heart failure.  There are diffuse degenerative changes throughout the thoracic spine.  IMPRESSION: Cardiomegaly.  No active lung disease.  Original Report Authenticated By: Juline Patch, M.D.   Ct Angio Chest W/cm &/or Wo Cm  10/19/2011  *RADIOLOGY REPORT*  Clinical Data: Shortness of breath.  Chest pain.  CT ANGIOGRAPHY CHEST  Technique:  Multidetector CT imaging of the chest using the standard protocol during bolus administration of intravenous contrast. Multiplanar reconstructed images including MIPs were obtained and reviewed to evaluate the vascular anatomy.  Contrast: OMNIPAQUE IOHEXOL 350 MG/ML SOLN  Comparison: 12/06/2003; 10/19/2011.  Findings: Prominent thyroid goiter with scattered calcifications again noted, with considerably thickened isthmus.  Technical factors related to patient body habitus reduce diagnostic sensitivity and specificity.  No filling defect is identified in the pulmonary arterial tree to suggest pulmonary embolus.  No pathologic thoracic adenopathy is observed.  Interventricular septal thickness of 2.4 cm is compatible with left ventricular hypertrophy.  There is confluent nodular airspace opacity in the superior segment left lower lobe, images 37-45 of series 5, suspicious for early pneumonia.  There is some mild adjacent  atelectasis in the left lower lobe.  Diffuse hepatic steatosis noted.  IMPRESSION:  1. No filling defect is identified in the pulmonary arterial tree to suggest pulmonary embolus. 2.  Confluents of nodular airspace opacity in the left lower lobe is suspicious for early pneumonia. 3.  Left ventricular hypertrophy. 4.  Prominent thyroid goiter, as shown on prior exam of 2005. 5.  Diffuse hepatic steatosis.  Original Report Authenticated By: Dellia Cloud, M.D.     Date: 10/19/2011  Rate: 86  Rhythm: normal sinus rhythm  QRS Axis: indeterminate  Intervals: QT prolonged  ST/T Wave abnormalities: nonspecific T wave changes  Conduction Disutrbances:first-degree A-V block   Narrative Interpretation:   Old EKG Reviewed: unchanged     MDM  Patient with increased dyspnea on exertion, who is tachycardic, with elevated blood pressure, and hemoptysis. Will obtain CT angio chest to rule out PE.  Cardiac work up in 2008 was unremarkable.  Discussed with my attending.    11:10 PM Pt's BP spikes to 220 systolic.  Pt has not had her regular medication today.  No evidence of end organ damage.  Will give labetalol 20mg  IV and will continue to monitor.  Pt will likely need admission.     11:45 PM Chest CT angio shows no evidence of PE.  However it did shows evidence or early pneumonia.  Pt does have elevated WBC of 13.5.  NO recent hospitalization.  Plan to start avelox IV and have pt admitted due to uncontrolled HTN and pneumonia.    12:06 AM I have consulted with [facility], Dr. Anne Hahn, who agrees to admit pt.  Pt to be admitted to tele bed, Team 4, under the care of Dr. Cleotis Lema.  He also recommend stat BMET,  second set of troponin, and another dose of labetalol 20mg .     Fayrene Helper, PA-C 10/20/11 0032

## 2011-10-19 NOTE — ED Notes (Signed)
Pt st's she went to Urgent Care for cough and congestion.   St's they did a CXR  And lab work, dx her with COPD and Bronchitis.  St's they did not treat her due to HTN and sent her to ED

## 2011-10-19 NOTE — ED Provider Notes (Addendum)
History     CSN: [id number]  Arrival date & time 10/19/11  1446   First MD Initiated Contact with Patient 10/19/11 1706      Chief Complaint  Patient presents with  . Cough    (Consider location/radiation/quality/duration/timing/severity/associated sxs/prior treatment) Patient is a 61 y.o. female presenting with cough. The history is provided by the patient.  Cough This is a new problem. The current episode started more than 1 week ago (reports seen by dr little on 5/3 , and told on 5/13 to come to [facility], ). The problem has been gradually worsening. The cough is productive of blood-tinged sputum (for past 2 days.). There has been no fever. Pertinent negatives include no wheezing. Associated symptoms comments: Chest tightness., not taking diabetes med , has CHF and hbp and MS and goiter.    Past Medical History  Diagnosis Date  . Diabetes mellitus   . Hypertension   . Asthma   . COPD (chronic obstructive pulmonary disease)     History reviewed. No pertinent past surgical history.  No family history on file.  History  Substance Use Topics  . Smoking status: Not on file  . Smokeless tobacco: Not on file  . Alcohol Use: No    OB History    Grav Para Term Preterm Abortions TAB SAB Ect Mult Living                  Review of Systems  Constitutional: Negative.   HENT: Negative.   Respiratory: Positive for cough and chest tightness. Negative for wheezing.   Cardiovascular: Negative.   Gastrointestinal: Negative.   Musculoskeletal: Negative.     Allergies  Review of patient's allergies indicates no known allergies.  Home Medications   Current Outpatient Rx  Name Route Sig Dispense Refill  . ALISKIREN FUMARATE 300 MG PO TABS Oral Take 300 mg by mouth daily.    Marland Kitchen AMLODIPINE BESYLATE 10 MG PO TABS Oral Take 10 mg by mouth daily.    . ATORVASTATIN CALCIUM 20 MG PO TABS Oral Take 20 mg by mouth daily.    . FUROSEMIDE 40 MG PO TABS Oral Take 40 mg by mouth daily.    .  ISOSORB DINITRATE-HYDRALAZINE 20-37.5 MG PO TABS Oral Take 1 tablet by mouth 3 (three) times daily.    Marland Kitchen LISINOPRIL 20 MG PO TABS Oral Take 40 mg by mouth daily.    Marland Kitchen LOSARTAN POTASSIUM 100 MG PO TABS Oral Take 100 mg by mouth daily.    Marland Kitchen METOPROLOL SUCCINATE ER 50 MG PO TB24 Oral Take 50 mg by mouth daily. Take with or immediately following a meal.    . NEBIVOLOL HCL 10 MG PO TABS Oral Take 10 mg by mouth 2 (two) times daily.    Marland Kitchen POTASSIUM CHLORIDE CRYS ER 20 MEQ PO TBCR Oral Take 20 mEq by mouth daily.      BP 223/150  Pulse 110  Temp(Src) 98.5 F (36.9 C) (Oral)  Resp 22  SpO2 99%  Physical Exam  Nursing note and vitals reviewed. Constitutional: She is oriented to person, place, and time. She appears well-developed and well-nourished.  Neck: Normal range of motion. Neck supple. Thyromegaly present.  Cardiovascular: Normal rate, regular rhythm, S1 normal, S2 normal and intact distal pulses.   Murmur heard.  Systolic murmur is present with a grade of 2/6  Pulmonary/Chest: Effort normal and breath sounds normal.  Abdominal: There is no tenderness.  Musculoskeletal: She exhibits no edema.  Lymphadenopathy:  She has no cervical adenopathy.  Neurological: She is alert and oriented to person, place, and time.  Skin: Skin is warm and dry.    ED Course  Procedures (including critical care time)  Labs Reviewed  POCT I-STAT, CHEM 8 - Abnormal; Notable for the following:    Glucose, Bld 254 (*)    Hemoglobin 16.7 (*)    HCT 49.0 (*)    All other components within normal limits   Dg Chest 2 View  10/19/2011  *RADIOLOGY REPORT*  Clinical Data: Cough for 9 days  CHEST - 2 VIEW  Comparison: Chest x-ray of 01/14/2011  Findings: The lungs are clear.  Cardiomegaly is stable.  There is no evidence of congestive heart failure.  There are diffuse degenerative changes throughout the thoracic spine.  IMPRESSION: Cardiomegaly.  No active lung disease.  Original Report Authenticated By: Juline Patch, M.D.     1. Hypertension, uncontrolled   2. 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         Linna Hoff, MD 10/19/11 1745  Linna Hoff, MD 11/17/11 [phone]

## 2011-10-19 NOTE — ED Notes (Signed)
Pt st's she takes she takes meds for HTN but has not taken it today

## 2011-10-19 NOTE — ED Notes (Signed)
Pt complains of chills, nausea, vomiting, diarrhea, and cough since Friday. Pt denies fever.

## 2011-10-20 ENCOUNTER — Encounter (HOSPITAL_COMMUNITY): Payer: Self-pay | Admitting: Internal Medicine

## 2011-10-20 DIAGNOSIS — I10 Essential (primary) hypertension: Secondary | ICD-10-CM

## 2011-10-20 DIAGNOSIS — D696 Thrombocytopenia, unspecified: Secondary | ICD-10-CM

## 2011-10-20 DIAGNOSIS — E1165 Type 2 diabetes mellitus with hyperglycemia: Secondary | ICD-10-CM

## 2011-10-20 DIAGNOSIS — E785 Hyperlipidemia, unspecified: Secondary | ICD-10-CM | POA: Diagnosis present

## 2011-10-20 DIAGNOSIS — J189 Pneumonia, unspecified organism: Secondary | ICD-10-CM

## 2011-10-20 DIAGNOSIS — G35 Multiple sclerosis: Secondary | ICD-10-CM | POA: Diagnosis present

## 2011-10-20 DIAGNOSIS — E782 Mixed hyperlipidemia: Secondary | ICD-10-CM

## 2011-10-20 LAB — POCT I-STAT, CHEM 8
BUN: 14 mg/dL (ref 6–23)
Calcium, Ion: 1.18 mmol/L (ref 1.12–1.32)
Chloride: 104 mEq/L (ref 96–112)
Creatinine, Ser: 0.9 mg/dL (ref 0.50–1.10)
TCO2: 30 mmol/L (ref 0–100)

## 2011-10-20 LAB — CBC
HCT: 37.2 % (ref 36.0–46.0)
MCHC: 33.3 g/dL (ref 30.0–36.0)
MCV: 91.9 fL (ref 78.0–100.0)
RDW: 14.6 % (ref 11.5–15.5)

## 2011-10-20 LAB — BASIC METABOLIC PANEL
CO2: 30 mEq/L (ref 19–32)
Calcium: 9.4 mg/dL (ref 8.4–10.5)
Chloride: 100 mEq/L (ref 96–112)
Creatinine, Ser: 0.85 mg/dL (ref 0.50–1.10)
Glucose, Bld: 219 mg/dL — ABNORMAL HIGH (ref 70–99)

## 2011-10-20 LAB — COMPREHENSIVE METABOLIC PANEL
ALT: 17 U/L (ref 0–35)
Albumin: 3.1 g/dL — ABNORMAL LOW (ref 3.5–5.2)
Alkaline Phosphatase: 71 U/L (ref 39–117)
Potassium: 3.4 mEq/L — ABNORMAL LOW (ref 3.5–5.1)
Sodium: 140 mEq/L (ref 135–145)
Total Protein: 7.3 g/dL (ref 6.0–8.3)

## 2011-10-20 LAB — GLUCOSE, CAPILLARY
Glucose-Capillary: 214 mg/dL — ABNORMAL HIGH (ref 70–99)
Glucose-Capillary: 177 mg/dL — ABNORMAL HIGH (ref 70–99)

## 2011-10-20 MED ORDER — NEBIVOLOL HCL 10 MG PO TABS
10.0000 mg | ORAL_TABLET | Freq: Two times a day (BID) | ORAL | Status: DC
  Administered 2011-10-20 – 2011-10-23 (×7): 10 mg via ORAL
  Filled 2011-10-20 (×10): qty 1

## 2011-10-20 MED ORDER — LOSARTAN POTASSIUM 50 MG PO TABS
100.0000 mg | ORAL_TABLET | Freq: Every day | ORAL | Status: DC
  Administered 2011-10-20 – 2011-10-23 (×4): 100 mg via ORAL
  Filled 2011-10-20 (×4): qty 2

## 2011-10-20 MED ORDER — MOXIFLOXACIN HCL IN NACL 400 MG/250ML IV SOLN
400.0000 mg | INTRAVENOUS | Status: DC
  Administered 2011-10-21 – 2011-10-22 (×3): 400 mg via INTRAVENOUS
  Filled 2011-10-20 (×3): qty 250

## 2011-10-20 MED ORDER — POTASSIUM CHLORIDE CRYS ER 20 MEQ PO TBCR
20.0000 meq | EXTENDED_RELEASE_TABLET | Freq: Every day | ORAL | Status: DC
  Administered 2011-10-20 – 2011-10-23 (×4): 20 meq via ORAL
  Filled 2011-10-20 (×4): qty 1

## 2011-10-20 MED ORDER — AMLODIPINE BESYLATE 10 MG PO TABS
10.0000 mg | ORAL_TABLET | Freq: Every day | ORAL | Status: DC
  Administered 2011-10-20 – 2011-10-23 (×4): 10 mg via ORAL
  Filled 2011-10-20 (×4): qty 1

## 2011-10-20 MED ORDER — BENZONATATE 100 MG PO CAPS
100.0000 mg | ORAL_CAPSULE | Freq: Three times a day (TID) | ORAL | Status: DC | PRN
  Administered 2011-10-20 – 2011-10-23 (×6): 100 mg via ORAL
  Filled 2011-10-20 (×7): qty 1

## 2011-10-20 MED ORDER — INSULIN GLARGINE 100 UNIT/ML ~~LOC~~ SOLN
60.0000 [IU] | Freq: Every day | SUBCUTANEOUS | Status: DC
  Administered 2011-10-20 – 2011-10-22 (×3): 60 [IU] via SUBCUTANEOUS

## 2011-10-20 MED ORDER — SODIUM CHLORIDE 0.9 % IJ SOLN
3.0000 mL | Freq: Two times a day (BID) | INTRAMUSCULAR | Status: DC
  Administered 2011-10-21 – 2011-10-22 (×2): 3 mL via INTRAVENOUS

## 2011-10-20 MED ORDER — LABETALOL HCL 5 MG/ML IV SOLN
10.0000 mg | INTRAVENOUS | Status: DC | PRN
  Administered 2011-10-22: 20 mg via INTRAVENOUS
  Administered 2011-10-22: 10 mg via INTRAVENOUS
  Filled 2011-10-20 (×2): qty 4

## 2011-10-20 MED ORDER — ZOLPIDEM TARTRATE 5 MG PO TABS
5.0000 mg | ORAL_TABLET | Freq: Every evening | ORAL | Status: DC | PRN
  Administered 2011-10-20 – 2011-10-22 (×3): 5 mg via ORAL
  Filled 2011-10-20 (×3): qty 1

## 2011-10-20 MED ORDER — SIMVASTATIN 40 MG PO TABS
40.0000 mg | ORAL_TABLET | Freq: Every day | ORAL | Status: DC
  Filled 2011-10-20: qty 1

## 2011-10-20 MED ORDER — SODIUM CHLORIDE 0.9 % IV SOLN
INTRAVENOUS | Status: AC
  Administered 2011-10-20: 02:00:00 via INTRAVENOUS

## 2011-10-20 MED ORDER — LISINOPRIL 40 MG PO TABS
40.0000 mg | ORAL_TABLET | Freq: Every day | ORAL | Status: DC
  Administered 2011-10-20 – 2011-10-23 (×4): 40 mg via ORAL
  Filled 2011-10-20 (×4): qty 1

## 2011-10-20 MED ORDER — INSULIN ASPART 100 UNIT/ML ~~LOC~~ SOLN
0.0000 [IU] | Freq: Three times a day (TID) | SUBCUTANEOUS | Status: DC
  Administered 2011-10-20 (×2): 2 [IU] via SUBCUTANEOUS
  Administered 2011-10-20: 3 [IU] via SUBCUTANEOUS
  Administered 2011-10-21: 2 [IU] via SUBCUTANEOUS
  Administered 2011-10-21 – 2011-10-22 (×2): 1 [IU] via SUBCUTANEOUS
  Administered 2011-10-23: 2 [IU] via SUBCUTANEOUS

## 2011-10-20 MED ORDER — INSULIN GLARGINE 100 UNIT/ML ~~LOC~~ SOLN
70.0000 [IU] | Freq: Every day | SUBCUTANEOUS | Status: DC
  Administered 2011-10-20 – 2011-10-23 (×4): 70 [IU] via SUBCUTANEOUS

## 2011-10-20 MED ORDER — ALISKIREN FUMARATE 150 MG PO TABS
300.0000 mg | ORAL_TABLET | Freq: Every day | ORAL | Status: DC
  Administered 2011-10-20 – 2011-10-23 (×4): 300 mg via ORAL
  Filled 2011-10-20 (×4): qty 2

## 2011-10-20 MED ORDER — ONDANSETRON HCL 4 MG PO TABS: 4.0000 mg | ORAL_TABLET | Freq: Four times a day (QID) | ORAL | Status: DC | PRN

## 2011-10-20 MED ORDER — ACETAMINOPHEN 325 MG PO TABS
650.0000 mg | ORAL_TABLET | Freq: Four times a day (QID) | ORAL | Status: DC | PRN
Start: 2011-10-20 — End: 2011-10-23
  Administered 2011-10-23: 650 mg via ORAL
  Filled 2011-10-20: qty 2

## 2011-10-20 MED ORDER — FUROSEMIDE 40 MG PO TABS
40.0000 mg | ORAL_TABLET | Freq: Every day | ORAL | Status: DC
  Administered 2011-10-20: 40 mg via ORAL
  Filled 2011-10-20: qty 1

## 2011-10-20 MED ORDER — ACETAMINOPHEN 650 MG RE SUPP: 650.0000 mg | Freq: Four times a day (QID) | RECTAL | Status: DC | PRN

## 2011-10-20 MED ORDER — POTASSIUM CHLORIDE IN NACL 20-0.45 MEQ/L-% IV SOLN
INTRAVENOUS | Status: DC
  Administered 2011-10-20 – 2011-10-22 (×3): via INTRAVENOUS
  Filled 2011-10-20 (×5): qty 1000

## 2011-10-20 MED ORDER — METOPROLOL SUCCINATE ER 50 MG PO TB24
50.0000 mg | ORAL_TABLET | Freq: Every day | ORAL | Status: DC
  Administered 2011-10-20 – 2011-10-23 (×4): 50 mg via ORAL
  Filled 2011-10-20 (×4): qty 1

## 2011-10-20 MED ORDER — LABETALOL HCL 5 MG/ML IV SOLN
20.0000 mg | Freq: Once | INTRAVENOUS | Status: AC
Start: 2011-10-20 — End: 2011-10-20
  Administered 2011-10-20: 20 mg via INTRAVENOUS
  Filled 2011-10-20: qty 4

## 2011-10-20 MED ORDER — SODIUM CHLORIDE 0.9 % IJ SOLN
3.0000 mL | Freq: Two times a day (BID) | INTRAMUSCULAR | Status: DC
  Administered 2011-10-23: 3 mL via INTRAVENOUS

## 2011-10-20 MED ORDER — ONDANSETRON HCL 4 MG/2ML IJ SOLN: 4.0000 mg | Freq: Four times a day (QID) | INTRAMUSCULAR | Status: DC | PRN

## 2011-10-20 MED ORDER — ATORVASTATIN CALCIUM 20 MG PO TABS
20.0000 mg | ORAL_TABLET | Freq: Every day | ORAL | Status: DC
  Administered 2011-10-20 – 2011-10-22 (×3): 20 mg via ORAL
  Filled 2011-10-20 (×4): qty 1

## 2011-10-20 MED ORDER — ISOSORB DINITRATE-HYDRALAZINE 20-37.5 MG PO TABS
1.0000 | ORAL_TABLET | Freq: Three times a day (TID) | ORAL | Status: DC
  Administered 2011-10-20 – 2011-10-23 (×10): 1 via ORAL
  Filled 2011-10-20 (×12): qty 1

## 2011-10-20 MED ORDER — LABETALOL HCL 5 MG/ML IV SOLN
10.0000 mg | INTRAVENOUS | Status: DC | PRN
  Filled 2011-10-20 (×2): qty 4

## 2011-10-20 NOTE — H&P (Signed)
[name] Fry is an 61 y.o. female.   PCP/Cardiologist - Dr.Little of [facility]. Neurologist - Dr.Willis. Chief Complaint: Coughing. HPI: 61 year-old female with known history of diabetes mellitus2, hypertension, hyperlipidemia and multiple sclerosis was referred from the urgent care because of increased blood pressure. Patient has been experiencing cough with productive sputum for a week and a half. She had called her cardiologist today who advised her to go to urgent care. At the urgent care her blood pressure was found to be very high and was referred to the ER. Patient denies any chest pain or shortness of breath. Has been having productive sputum sometimes blood-tinged. In the ER patient was found to have systolic blood pressure more than 210 and diastolic blood pressures more than 110. Labetalol IV was given. And CT angiogram of the chest was done to rule out pulmonary embolism and it showed possible developing pneumonia. Patient will be admitted for further. Patient states she had ran out of her antihypertensives and has not taken it for one day.  Past Medical History  Diagnosis Date  . Diabetes mellitus   . Hypertension   . Asthma   . COPD (chronic obstructive pulmonary disease)   . Multiple sclerosis     History reviewed. No pertinent past surgical history.  History reviewed. No pertinent family history. Social History:  reports that she has never smoked. She does not have any smokeless tobacco history on file. She reports that she does not drink alcohol or use illicit drugs.  Allergies:  Allergies  Allergen Reactions  . Codeine Other (See Comments)    unknown  . Penicillins Other (See Comments)    unknown    Medications Prior to Admission  Medication Sig Dispense Refill  . aliskiren (TEKTURNA) 300 MG tablet Take 300 mg by mouth daily.      Marland Kitchen amLODipine (NORVASC) 10 MG tablet Take 10 mg by mouth daily.      Marland Kitchen atorvastatin (LIPITOR) 20 MG tablet Take 20 mg by mouth daily.        . furosemide (LASIX) 40 MG tablet Take 40 mg by mouth daily.      . isosorbide-hydrALAZINE (BIDIL) 20-37.5 MG per tablet Take 1 tablet by mouth 3 (three) times daily.      Marland Kitchen lisinopril (PRINIVIL,ZESTRIL) 20 MG tablet Take 40 mg by mouth daily.      Marland Kitchen losartan (COZAAR) 100 MG tablet Take 100 mg by mouth daily.      . metoprolol succinate (TOPROL-XL) 50 MG 24 hr tablet Take 50 mg by mouth daily. Take with or immediately following a meal.      . nebivolol (BYSTOLIC) 10 MG tablet Take 10 mg by mouth 2 (two) times daily.      . potassium chloride SA (K-DUR,KLOR-CON) 20 MEQ tablet Take 20 mEq by mouth daily.        Results for orders placed during the hospital encounter of 10/19/11 (from the past 48 hour(s))  TROPONIN I     Status: Normal   Collection Time   10/19/11 10:03 PM      Component Value Range Comment   Troponin I <0.30  <0.30 (ng/mL)   CBC     Status: Abnormal   Collection Time   10/19/11 10:03 PM      Component Value Range Comment   WBC 13.5 (*) 4.0 - 10.5 (K/uL)    RBC 4.70  3.87 - 5.11 (MIL/uL)    Hemoglobin 14.7  12.0 - 15.0 (g/dL)  HCT 43.2  36.0 - 46.0 (%)    MCV 91.9  78.0 - 100.0 (fL)    MCH 31.3  26.0 - 34.0 (pg)    MCHC 34.0  30.0 - 36.0 (g/dL)    RDW 95.6  21.3 - 08.6 (%)    Platelets 155  150 - 400 (K/uL)   DIFFERENTIAL     Status: Abnormal   Collection Time   10/19/11 10:03 PM      Component Value Range Comment   Neutrophils Relative 65  43 - 77 (%)    Lymphocytes Relative 26  12 - 46 (%)    Monocytes Relative 7  3 - 12 (%)    Eosinophils Relative 2  0 - 5 (%)    Basophils Relative 0  0 - 1 (%)    Neutro Abs 8.8 (*) 1.7 - 7.7 (K/uL)    Lymphs Abs 3.5  0.7 - 4.0 (K/uL)    Monocytes Absolute 0.9  0.1 - 1.0 (K/uL)    Eosinophils Absolute 0.3  0.0 - 0.7 (K/uL)    Basophils Absolute 0.0  0.0 - 0.1 (K/uL)    WBC Morphology ATYPICAL LYMPHOCYTES   MILD LEFT SHIFT (1-5% METAS, OCC MYELO, OCC BANDS)   Smear Review LARGE PLATELETS PRESENT     GLUCOSE, CAPILLARY      Status: Abnormal   Collection Time   10/19/11 10:15 PM      Component Value Range Comment   Glucose-Capillary 207 (*) 70 - 99 (mg/dL)   BASIC METABOLIC PANEL     Status: Abnormal   Collection Time   10/20/11 12:08 AM      Component Value Range Comment   Sodium 139  135 - 145 (mEq/L)    Potassium 4.2  3.5 - 5.1 (mEq/L)    Chloride 100  96 - 112 (mEq/L)    CO2 30  19 - 32 (mEq/L)    Glucose, Bld 219 (*) 70 - 99 (mg/dL)    BUN 14  6 - 23 (mg/dL)    Creatinine, Ser 5.78  0.50 - 1.10 (mg/dL)    Calcium 9.4  8.4 - 10.5 (mg/dL)    GFR calc non Af Amer 74 (*) >90 (mL/min)    GFR calc Af Amer 85 (*) >90 (mL/min)   TROPONIN I     Status: Normal   Collection Time   10/20/11 12:08 AM      Component Value Range Comment   Troponin I <0.30  <0.30 (ng/mL)   POCT I-STAT, CHEM 8     Status: Abnormal   Collection Time   10/20/11 12:11 AM      Component Value Range Comment   Sodium 141  135 - 145 (mEq/L)    Potassium 4.3  3.5 - 5.1 (mEq/L)    Chloride 104  96 - 112 (mEq/L)    BUN 14  6 - 23 (mg/dL)    Creatinine, Ser 4.69  0.50 - 1.10 (mg/dL)    Glucose, Bld 629 (*) 70 - 99 (mg/dL)    Calcium, Ion 5.28  1.12 - 1.32 (mmol/L)    TCO2 30  0 - 100 (mmol/L)    Hemoglobin 15.0  12.0 - 15.0 (g/dL)    HCT 41.3  24.4 - 01.0 (%)    Dg Chest 2 View  10/19/2011  *RADIOLOGY REPORT*  Clinical Data: Cough for 9 days  CHEST - 2 VIEW  Comparison: Chest x-ray of 01/14/2011  Findings: The lungs are clear.  Cardiomegaly is stable.  There is no evidence of congestive heart failure.  There are diffuse degenerative changes throughout the thoracic spine.  IMPRESSION: Cardiomegaly.  No active lung disease.  Original Report Authenticated By: Juline Patch, M.D.   Ct Angio Chest W/cm &/or Wo Cm  10/19/2011  *RADIOLOGY REPORT*  Clinical Data: Shortness of breath.  Chest pain.  CT ANGIOGRAPHY CHEST  Technique:  Multidetector CT imaging of the chest using the standard protocol during bolus administration of intravenous  contrast. Multiplanar reconstructed images including MIPs were obtained and reviewed to evaluate the vascular anatomy.  Contrast: OMNIPAQUE IOHEXOL 350 MG/ML SOLN  Comparison: 12/06/2003; 10/19/2011.  Findings: Prominent thyroid goiter with scattered calcifications again noted, with considerably thickened isthmus.  Technical factors related to patient body habitus reduce diagnostic sensitivity and specificity.  No filling defect is identified in the pulmonary arterial tree to suggest pulmonary embolus.  No pathologic thoracic adenopathy is observed.  Interventricular septal thickness of 2.4 cm is compatible with left ventricular hypertrophy.  There is confluent nodular airspace opacity in the superior segment left lower lobe, images 37-45 of series 5, suspicious for early pneumonia.  There is some mild adjacent atelectasis in the left lower lobe.  Diffuse hepatic steatosis noted.  IMPRESSION:  1. No filling defect is identified in the pulmonary arterial tree to suggest pulmonary embolus. 2.  Confluents of nodular airspace opacity in the left lower lobe is suspicious for early pneumonia. 3.  Left ventricular hypertrophy. 4.  Prominent thyroid goiter, as shown on prior exam of 2005. 5.  Diffuse hepatic steatosis.  Original Report Authenticated By: Dellia Cloud, M.D.    Review of Systems  Constitutional: Negative.   HENT: Negative.   Eyes: Negative.   Respiratory: Positive for cough and sputum production.   Cardiovascular: Negative.   Gastrointestinal: Negative.   Genitourinary: Negative.   Musculoskeletal: Negative.   Skin: Negative.   Neurological: Negative.   Endo/Heme/Allergies: Negative.   Psychiatric/Behavioral: Negative.     Blood pressure 225/119, pulse 81, temperature 97.7 F (36.5 C), temperature source Oral, resp. rate 18, weight 106.822 kg (235 lb 8 oz), SpO2 100.00%. Physical Exam  Constitutional: She is oriented to person, place, and time. She appears well-developed and  well-nourished. No distress.  HENT:  Head: Normocephalic and atraumatic.  Right Ear: External ear normal.  Left Ear: External ear normal.  Nose: Nose normal.  Mouth/Throat: Oropharynx is clear and moist. No oropharyngeal exudate.  Eyes: Conjunctivae are normal. Pupils are equal, round, and reactive to light. Right eye exhibits no discharge. Left eye exhibits no discharge. No scleral icterus.  Neck: Normal range of motion. Neck supple.  Cardiovascular: Normal rate and regular rhythm.   Respiratory: Effort normal and breath sounds normal. No respiratory distress. She has no wheezes. She has no rales.  GI: Soft. Bowel sounds are normal. She exhibits no distension. There is no tenderness. There is no rebound.  Musculoskeletal: Normal range of motion. She exhibits no edema and no tenderness.  Neurological: She is alert and oriented to person, place, and time.       Moves all extremities.  Skin: Skin is warm and dry. No rash noted. She is not diaphoretic. No erythema.  Psychiatric: Her behavior is normal.     Assessment/Plan #1. Community-acquired pneumonia - Avelox has been started which we will continue. #2. Accelerated hypertension due to noncompliance - we'll continue to keep patient on when necessary labetalol 10 mg IV every 2 hours when necessary for systolic blood pressure more than 160.  We will resume all home medications and give one dose. She had similar episode last year improved with adding Bidil. Blood pressure remains high despite these measures then we may have to start IV antihypertensive infusion. #3. Diabetes mellitus2 - continue home medications. I have confirmed dose of Lantus with patient. Place patient on sliding scale coverage. #4. History of hyperlipidemia - continue statins. #5. History of multiple sclerosis.  CODE STATUS - full code.  Justine Cossin N. 10/20/2011, 3:43 AM

## 2011-10-20 NOTE — ED Notes (Signed)
Pt ambulatory to bathroom

## 2011-10-20 NOTE — Progress Notes (Addendum)
H&P reviewed, patient seen and examined. Patient complaining of productive cough for couple of weeks and watery nonbloody diarrhea for more than a week, Plan: -Continue antibiotics for CAP -Send stool for C. difficile PCR, hold Lasix and gently hydrate with half-normal saline plus KCl -Continue antihypertensives -Rest of assessment and plan as per H&P

## 2011-10-20 NOTE — Progress Notes (Signed)
PHARMACIST - PHYSICIAN COMMUNICATION DR:    CONCERNING:  Duplication of therapy  DESCRIPTION: Pt is on duplicate beta-blocker (nebivolol + metoprolol) and angiotension (lisinopril + losartan) medications at home, which have been ordered on admission for PNA.    RECOMMENDATION: Consider maximizing one agent of each class.    Vernard Gambles, PharmD, BCPS 10/20/2011 3:46 AM

## 2011-10-21 DIAGNOSIS — D696 Thrombocytopenia, unspecified: Secondary | ICD-10-CM

## 2011-10-21 DIAGNOSIS — E782 Mixed hyperlipidemia: Secondary | ICD-10-CM

## 2011-10-21 DIAGNOSIS — E1165 Type 2 diabetes mellitus with hyperglycemia: Secondary | ICD-10-CM

## 2011-10-21 DIAGNOSIS — I10 Essential (primary) hypertension: Secondary | ICD-10-CM

## 2011-10-21 LAB — BASIC METABOLIC PANEL
BUN: 11 mg/dL (ref 6–23)
CO2: 26 mEq/L (ref 19–32)
Calcium: 8.9 mg/dL (ref 8.4–10.5)
Creatinine, Ser: 0.88 mg/dL (ref 0.50–1.10)
GFR calc non Af Amer: 71 mL/min — ABNORMAL LOW (ref >90)
Glucose, Bld: 113 mg/dL — ABNORMAL HIGH (ref 70–99)
Sodium: 142 mEq/L (ref 135–145)

## 2011-10-21 MED ORDER — INTERFERON BETA-1A 30 MCG/0.5ML IM KIT
30.0000 ug | PACK | INTRAMUSCULAR | Status: DC
  Administered 2011-10-21: 30 ug via INTRAMUSCULAR

## 2011-10-21 MED ORDER — LOPERAMIDE HCL 2 MG PO CAPS
2.0000 mg | ORAL_CAPSULE | Freq: Three times a day (TID) | ORAL | Status: DC | PRN
  Administered 2011-10-21: 2 mg via ORAL
  Filled 2011-10-21: qty 1

## 2011-10-21 NOTE — Progress Notes (Signed)
Pt notified me that she already scheduled a follow-up appointment with Dr. Billee Cashing for Friday May 24th, at 5:15pm.

## 2011-10-21 NOTE — Discharge Instructions (Signed)
Pt spoke with [facility] and has agreed to call [facility] for an appointment.

## 2011-10-21 NOTE — Progress Notes (Signed)
Subjective: Patient seen and examined, continued to complain of nonbloody diarrhea and cough  Objective: Vital signs in last 24 hours: Temp:  [97.9 F (36.6 C)-98.4 F (36.9 C)] 98.1 F (36.7 C) (05/17 1703) Pulse Rate:  [65-75] 75  (05/17 1703) Resp:  [20] 20  (05/17 1703) BP: (123-159)/(67-76) 123/73 mmHg (05/17 1703) SpO2:  [96 %-100 %] 96 % (05/17 1703) Weight:  [106.8 kg (235 lb 7.2 oz)] 106.8 kg (235 lb 7.2 oz) (05/16 2010) Weight change: -0.022 kg (-0.8 oz) Last BM Date: 10/20/11  Intake/Output from previous day: 05/16 0701 - 05/17 0700 In: 2318.8 [P.O.:840; I.V.:1228.8; IV Piggyback:250] Out: -  Total I/O In: 2382.5 [P.O.:840; I.V.:1542.5] Out: -    Physical Exam: General: Alert, awake, oriented x3, in no acute distress. Heart: Regular rate and rhythm, without murmurs, rubs, gallops. Lungs: Decreased breathing sounds bilaterally. No rales or wheezing Abdomen: Soft, nontender, nondistended, positive bowel sounds. Extremities: No clubbing cyanosis or edema with positive pedal pulses. Neuro: Grossly intact, nonfocal.    Lab Results: Results for orders placed during the hospital encounter of 10/19/11 (from the past 24 hour(s))  GLUCOSE, CAPILLARY     Status: Abnormal   Collection Time   10/20/11  9:02 PM      Component Value Range   Glucose-Capillary 177 (*) 70 - 99 (mg/dL)  BASIC METABOLIC PANEL     Status: Abnormal   Collection Time   10/21/11  6:05 AM      Component Value Range   Sodium 142  135 - 145 (mEq/L)   Potassium 3.5  3.5 - 5.1 (mEq/L)   Chloride 107  96 - 112 (mEq/L)   CO2 26  19 - 32 (mEq/L)   Glucose, Bld 113 (*) 70 - 99 (mg/dL)   BUN 11  6 - 23 (mg/dL)   Creatinine, Ser 6.21  0.50 - 1.10 (mg/dL)   Calcium 8.9  8.4 - 30.8 (mg/dL)   GFR calc non Af Amer 71 (*) >90 (mL/min)   GFR calc Af Amer 82 (*) >90 (mL/min)  GLUCOSE, CAPILLARY     Status: Normal   Collection Time   10/21/11  7:35 AM      Component Value Range   Glucose-Capillary 82  70 -  99 (mg/dL)  CLOSTRIDIUM DIFFICILE BY PCR     Status: Normal   Collection Time   10/21/11  9:23 AM      Component Value Range   C difficile by pcr NEGATIVE  NEGATIVE   GLUCOSE, CAPILLARY     Status: Abnormal   Collection Time   10/21/11 10:08 AM      Component Value Range   Glucose-Capillary 186 (*) 70 - 99 (mg/dL)  GLUCOSE, CAPILLARY     Status: Abnormal   Collection Time   10/21/11 11:22 AM      Component Value Range   Glucose-Capillary 167 (*) 70 - 99 (mg/dL)  GLUCOSE, CAPILLARY     Status: Abnormal   Collection Time   10/21/11  4:40 PM      Component Value Range   Glucose-Capillary 122 (*) 70 - 99 (mg/dL)    Studies/Results: Ct Angio Chest W/cm &/or Wo Cm  10/19/2011  *RADIOLOGY REPORT*  Clinical Data: Shortness of breath.  Chest pain.  CT ANGIOGRAPHY CHEST  Technique:  Multidetector CT imaging of the chest using the standard protocol during bolus administration of intravenous contrast. Multiplanar reconstructed images including MIPs were obtained and reviewed to evaluate the vascular anatomy.  Contrast: OMNIPAQUE IOHEXOL  350 MG/ML SOLN  Comparison: 12/06/2003; 10/19/2011.  Findings: Prominent thyroid goiter with scattered calcifications again noted, with considerably thickened isthmus.  Technical factors related to patient body habitus reduce diagnostic sensitivity and specificity.  No filling defect is identified in the pulmonary arterial tree to suggest pulmonary embolus.  No pathologic thoracic adenopathy is observed.  Interventricular septal thickness of 2.4 cm is compatible with left ventricular hypertrophy.  There is confluent nodular airspace opacity in the superior segment left lower lobe, images 37-45 of series 5, suspicious for early pneumonia.  There is some mild adjacent atelectasis in the left lower lobe.  Diffuse hepatic steatosis noted.  IMPRESSION:  1. No filling defect is identified in the pulmonary arterial tree to suggest pulmonary embolus. 2.  Confluents of nodular  airspace opacity in the left lower lobe is suspicious for early pneumonia. 3.  Left ventricular hypertrophy. 4.  Prominent thyroid goiter, as shown on prior exam of 2005. 5.  Diffuse hepatic steatosis.  Original Report Authenticated By: Dellia Cloud, M.D.    Medications:    . aliskiren  300 mg Oral Daily  . amLODipine  10 mg Oral Daily  . atorvastatin  20 mg Oral q1800  . insulin aspart  0-9 Units Subcutaneous TID WC  . insulin glargine  60 Units Subcutaneous QHS  . insulin glargine  70 Units Subcutaneous Daily  . interferon beta-1a  30 mcg Intramuscular Q7 days  . isosorbide-hydrALAZINE  1 tablet Oral TID  . lisinopril  40 mg Oral Daily  . losartan  100 mg Oral Daily  . metoprolol succinate  50 mg Oral Daily  . moxifloxacin  400 mg Intravenous Q24H  . nebivolol  10 mg Oral BID  . potassium chloride SA  20 mEq Oral Daily  . sodium chloride  3 mL Intravenous Q12H  . sodium chloride  3 mL Intravenous Q12H    acetaminophen, acetaminophen, benzonatate, labetalol, loperamide, ondansetron (ZOFRAN) IV, ondansetron, zolpidem     . 0.45 % NaCl with KCl 20 mEq / L 75 mL/hr at 10/21/11 3976    Assessment/Plan:  Community-acquired pneumonia - continue Avelox , Tessalon when necessary  Accelerated hypertension due to noncompliance -improved, continue meds and adjust when necessary Diarrhea: C. difficile negative, send stool culture, ova and parasite. Imodium when necessary. Gentle hydration  Diabetes mellitus2 - continue home medications.and sliding scale coverage. Monitor CBGs  History of hyperlipidemia - continue statins.   History of multiple sclerosis: Continue Avonex  Nonsustained V. tach: Short run and Patient is asymptomatic. Continue to observe on telemetry, continue metoprolol.    LOS: 2 days   Breeana Sawtelle 10/21/2011, 6:38 PM

## 2011-10-21 NOTE — Progress Notes (Signed)
Pt had a 4 beat run of vtach. Pt asymptomatic, just "tired". VSS. MD notified. No orders were given.

## 2011-10-22 DIAGNOSIS — R197 Diarrhea, unspecified: Secondary | ICD-10-CM

## 2011-10-22 LAB — GLUCOSE, CAPILLARY: Glucose-Capillary: 82 mg/dL (ref 70–99)

## 2011-10-22 NOTE — Progress Notes (Signed)
Subjective: Patient seen and examined, stated that she's feeling washed out today. Reported improvement in her diarrhea however complaining of occasional cough shortness of breath.  Objective: Vital signs in last 24 hours: Temp:  [98.1 F (36.7 C)-98.8 F (37.1 C)] 98.4 F (36.9 C) (05/18 0847) Pulse Rate:  [72-77] 74  (05/18 1002) Resp:  [20] 20  (05/18 0847) BP: (123-183)/(70-97) 172/78 mmHg (05/18 1002) SpO2:  [96 %-100 %] 98 % (05/18 0847) Weight:  [106.8 kg (235 lb 7.2 oz)] 106.8 kg (235 lb 7.2 oz) (05/17 1946) Weight change: 0 kg (0 lb) Last BM Date: 10/21/11  Intake/Output from previous day: 05/17 0701 - 05/18 0700 In: 2622.5 [P.O.:1080; I.V.:1542.5] Out: -      Physical Exam: General: Alert, awake, oriented x3, in no acute distress.  Heart: Regular rate and rhythm, without murmurs, rubs, gallops.  Lungs: Decreased breathing sounds bilaterally. No rales or wheezing  Abdomen: Soft, nontender, nondistended, positive bowel sounds.  Extremities: No clubbing cyanosis or edema with positive pedal pulses.  Neuro: Grossly intact, nonfocal.     Lab Results: Results for orders placed during the hospital encounter of 10/19/11 (from the past 24 hour(s))  GLUCOSE, CAPILLARY     Status: Abnormal   Collection Time   10/21/11  4:40 PM      Component Value Range   Glucose-Capillary 122 (*) 70 - 99 (mg/dL)  GLUCOSE, CAPILLARY     Status: Normal   Collection Time   10/21/11  9:02 PM      Component Value Range   Glucose-Capillary 82  70 - 99 (mg/dL)  GLUCOSE, CAPILLARY     Status: Abnormal   Collection Time   10/22/11  7:41 AM      Component Value Range   Glucose-Capillary 106 (*) 70 - 99 (mg/dL)  GLUCOSE, CAPILLARY     Status: Normal   Collection Time   10/22/11 11:30 AM      Component Value Range   Glucose-Capillary 81  70 - 99 (mg/dL)    Studies/Results: No results found.  Medications:    . aliskiren  300 mg Oral Daily  . amLODipine  10 mg Oral Daily  .  atorvastatin  20 mg Oral q1800  . insulin aspart  0-9 Units Subcutaneous TID WC  . insulin glargine  60 Units Subcutaneous QHS  . insulin glargine  70 Units Subcutaneous Daily  . interferon beta-1a  30 mcg Intramuscular Q7 days  . isosorbide-hydrALAZINE  1 tablet Oral TID  . lisinopril  40 mg Oral Daily  . losartan  100 mg Oral Daily  . metoprolol succinate  50 mg Oral Daily  . moxifloxacin  400 mg Intravenous Q24H  . nebivolol  10 mg Oral BID  . potassium chloride SA  20 mEq Oral Daily  . sodium chloride  3 mL Intravenous Q12H  . sodium chloride  3 mL Intravenous Q12H    acetaminophen, acetaminophen, benzonatate, labetalol, loperamide, ondansetron (ZOFRAN) IV, ondansetron, zolpidem     . 0.45 % NaCl with KCl 20 mEq / L 75 mL/hr at 10/22/11 1212    Assessment/Plan:  Community-acquired pneumonia - continue Avelox , Tessalon when necessary  Accelerated hypertension due to noncompliance -improved, continue meds and adjust when necessary  Diarrhea: Improved. C. difficile negative, stool culture, ova and parasite pending. Imodium when necessary. DC IV fluids Diabetes mellitus2 - continue home medications.and sliding scale coverage. Monitor CBGs  History of hyperlipidemia - continue statins.  History of multiple sclerosis: Continue Avonex  Nonsustained V.  tach: Short run and Patient is asymptomatic. Continue to observe on telemetry, continue metoprolol. Physical deconditioning: PT consult    LOS: 3 days   [name] Fry 10/22/2011, 1:09 PM

## 2011-10-22 NOTE — Evaluation (Signed)
Physical Therapy Evaluation Patient Details Name: [name] Fry MRN: [id number] DOB: 14-Apr-1951 Today's Date: 10/22/2011 Time: 4098-1191 PT Time Calculation (min): 18 min  PT Assessment / Plan / Recommendation Clinical Impression  Patient s/p admit for PNA.  Patient did not have RW prior to admit but states she has multiple falls.  Feel a RW would be safest for patient as well as HHPT f/u at d/c.  Patient states husband is there most of time.      PT Assessment  Patient needs continued PT services    Follow Up Recommendations  Home health PT;Supervision for mobility/OOB    Barriers to Discharge  None      lEquipment Recommendations  Rolling walker with 5" wheels    Recommendations for Other Services   None  Frequency Min 3X/week    Precautions / Restrictions Precautions Precautions: Fall Precaution Comments: Patient reports multiple falls at home due to neuropathy Restrictions Weight Bearing Restrictions: No   Pertinent Vitals/Pain VSS/Some pain      Mobility  Bed Mobility Bed Mobility: Rolling Left;Left Sidelying to Sit;Sitting - Scoot to Edge of Bed Rolling Left: 6: Modified independent (Device/Increase time) Left Sidelying to Sit: 6: Modified independent (Device/Increase time) Sitting - Scoot to Edge of Bed: 6: Modified independent (Device/Increase time) Transfers Transfers: Sit to Stand;Stand to Sit Sit to Stand: 6: Modified independent (Device/Increase time);With upper extremity assist;From bed Stand to Sit: 6: Modified independent (Device/Increase time);To bed;With upper extremity assist Details for Transfer Assistance: moves quickly at times but appeared to be safe in hospital environment.  Ambulation/Gait Ambulation/Gait Assistance: 4: Min guard Ambulation Distance (Feet): 150 Feet Assistive device: Rolling walker Ambulation/Gait Assistance Details: Patient needed occasional cues to stay close to RW.  Gets distracted from task at times.   Gait Pattern:  Step-through pattern;Decreased stride length;Shuffle Gait velocity: Slow cadence Stairs: No Wheelchair Mobility Wheelchair Mobility: No         PT Diagnosis: Difficulty walking  PT Problem List: Decreased activity tolerance;Decreased balance;Decreased mobility;Decreased knowledge of use of DME PT Treatment Interventions: DME instruction;Gait training;Stair training;Functional mobility training;Therapeutic activities;Therapeutic exercise;Balance training;Patient/family education   PT Goals Acute Rehab PT Goals PT Goal Formulation: With patient Time For Goal Achievement: 10/29/11 Potential to Achieve Goals: Good Pt will go Supine/Side to Sit: with modified independence PT Goal: Supine/Side to Sit - Progress: Goal set today Pt will go Sit to Stand: with modified independence PT Goal: Sit to Stand - Progress: Goal set today Pt will Ambulate: with modified independence;16 - 50 feet;51 - 150 feet;with least restrictive assistive device PT Goal: Ambulate - Progress: Goal set today Pt will Go Up / Down Stairs: 3-5 stairs;with supervision;with least restrictive assistive device PT Goal: Up/Down Stairs - Progress: Goal set today  Visit Information  Last PT Received On: 10/22/11 Assistance Needed: +1    Subjective Data  Subjective: "Dr. Anne Hahn told me to always use a cane and get a scooter." Patient Stated Goal: To go home-   Prior Functioning  Home Living Lives With: Spouse Available Help at Discharge: Family;Available PRN/intermittently Type of Home: House Home Access: Stairs to enter [employer] of Steps: 3 Entrance Stairs-Rails: None Home Layout: One level Bathroom Shower/Tub: Engineer, manufacturing systems: Handicapped height Home Adaptive Equipment: Straight cane Prior Function Level of Independence: Needs assistance Needs Assistance: Bathing;Dressing;Meal Prep;Light Housekeeping Bath: Maximal Dressing: Moderate Meal Prep: Total Light Housekeeping:  Total Able to Take Stairs?: Yes Driving: Yes Comments: Trying to get disability Communication Communication: No difficulties Dominant Hand: Right  Cognition  Overall Cognitive Status: Appears within functional limits for tasks assessed/performed Arousal/Alertness: Awake/alert Orientation Level: Appears intact for tasks assessed Behavior During Session: [facility] for tasks performed    Extremity/Trunk Assessment Right Upper Extremity Assessment RUE ROM/Strength/Tone: Within functional levels RUE Sensation: History of peripheral neuropathy RUE Coordination: WFL - gross/fine motor Left Upper Extremity Assessment LUE ROM/Strength/Tone: Within functional levels LUE Sensation: History of peripheral neuropathy LUE Coordination: WFL - gross/fine motor Right Lower Extremity Assessment RLE ROM/Strength/Tone: Within functional levels RLE Sensation: History of peripheral neuropathy RLE Coordination: WFL - gross/fine motor Left Lower Extremity Assessment LLE ROM/Strength/Tone: Within functional levels LLE Sensation: History of peripheral neuropathy LLE Coordination: WFL - gross/fine motor Trunk Assessment Trunk Assessment: Normal   Balance    End of Session PT - End of Session Equipment Utilized During Treatment: Gait belt Activity Tolerance: Patient limited by fatigue Patient left: in bed;with call bell/phone within reach Nurse Communication: Mobility status   INGOLD,Beniah Magnan 10/22/2011, 2:05 PM  [facility] Acute Rehabilitation [phone] (pager)

## 2011-10-23 LAB — BASIC METABOLIC PANEL
BUN: 10 mg/dL (ref 6–23)
GFR calc non Af Amer: 76 mL/min — ABNORMAL LOW (ref >90)
Glucose, Bld: 82 mg/dL (ref 70–99)
Potassium: 3.4 mEq/L — ABNORMAL LOW (ref 3.5–5.1)

## 2011-10-23 LAB — GLUCOSE, CAPILLARY: Glucose-Capillary: 90 mg/dL (ref 70–99)

## 2011-10-23 MED ORDER — INSULIN GLARGINE 100 UNIT/ML ~~LOC~~ SOLN: 60.0000 [IU] | Freq: Two times a day (BID) | SUBCUTANEOUS | Status: DC

## 2011-10-23 MED ORDER — FLUTICASONE PROPIONATE 50 MCG/ACT NA SUSP: 2.0000 | Freq: Every day | NASAL | Status: AC

## 2011-10-23 MED ORDER — LOPERAMIDE HCL 2 MG PO CAPS: 2.0000 mg | ORAL_CAPSULE | Freq: Three times a day (TID) | ORAL | Status: AC | PRN

## 2011-10-23 MED ORDER — LEVOFLOXACIN 750 MG PO TABS: 750.0000 mg | ORAL_TABLET | Freq: Every day | ORAL | Status: AC

## 2011-10-23 MED ORDER — METOPROLOL SUCCINATE ER 100 MG PO TB24: 100.0000 mg | ORAL_TABLET | Freq: Every day | ORAL | Status: DC

## 2011-10-23 MED ORDER — POTASSIUM CHLORIDE CRYS ER 20 MEQ PO TBCR
20.0000 meq | EXTENDED_RELEASE_TABLET | Freq: Once | ORAL | Status: AC
  Administered 2011-10-23: 20 meq via ORAL
  Filled 2011-10-23: qty 1

## 2011-10-23 NOTE — Progress Notes (Signed)
   CARE MANAGEMENT NOTE 10/23/2011  Patient:  [name] Fry, [name] Fry   Account Number:  [id number]  Date Initiated:  10/23/2011  Documentation initiated by:  [facility]  Subjective/Objective Assessment:   community acquired pneumonia     Action/Plan:   lives at home with husband   Anticipated DC Date:  10/23/2011   Anticipated DC Plan:  HOME W HOME HEALTH SERVICES      DC Planning Services  CM consult      Choice offered to / List presented to:  C-1 Patient   DME arranged  Levan Hurst      DME agency  [facility]     HH arranged  HH-2 PT      [facility] agency  [facility]   Status of service:  Completed, signed off Medicare Important Message given?   (If response is "NO", the following Medicare IM given date fields will be blank) Date Medicare IM given:   Date Additional Medicare IM given:    Discharge Disposition:  HOME W HOME HEALTH SERVICES  Per UR Regulation:    If discussed at Long Length of Stay Meetings, dates discussed:    Comments:  10/23/2011 1230 Spoke to pt and she used [facility] in the past. Contacted [facility] for [facility] PT and DME for scheduled d/c today. Isidoro Donning RN CCM Case Mgmt phone [phone]

## 2011-10-23 NOTE — Discharge Summary (Addendum)
Patient ID: [name] Fry MRN: [id number] DOB/AGE: [birth year] 61 y.o.  Admit date: 10/19/2011 Discharge date: 10/23/2011  Primary Care Physician:  Judene Companion, MD, MD   Discharge Diagnoses:    Present on Admission:  Community-acquired pneumonia  Malignant hypertension  .Diabetes mellitus .Hyperlipidemia .Multiple sclerosis Diarrhea Hypokalemia  Medication List  As of 10/23/2011 11:27 AM   STOP taking these medications         nebivolol 10 MG tablet         TAKE these medications         aliskiren 300 MG tablet   Commonly known as: TEKTURNA   Take 300 mg by mouth daily.      amLODipine 10 MG tablet   Commonly known as: NORVASC   Take 10 mg by mouth daily.      atorvastatin 20 MG tablet   Commonly known as: LIPITOR   Take 20 mg by mouth daily.      furosemide 40 MG tablet   Commonly known as: LASIX   Take 40 mg by mouth daily.      insulin glargine 100 UNIT/ML injection   Commonly known as: LANTUS   Inject 60-70 Units into the skin 2 (two) times daily. Inject 70 units in the morning and 60 units at bedtime      interferon beta-1a 30 MCG/0.5ML injection   Commonly known as: AVONEX   Inject 30 mcg into the muscle every 7 (seven) days.      isosorbide-hydrALAZINE 20-37.5 MG per tablet   Commonly known as: BIDIL   Take 1 tablet by mouth 3 (three) times daily.      levofloxacin 750 MG tablet   Commonly known as: LEVAQUIN   Take 1 tablet (750 mg total) by mouth daily.      lisinopril 20 MG tablet   Commonly known as: PRINIVIL,ZESTRIL   Take 40 mg by mouth daily.      loperamide 2 MG capsule   Commonly known as: IMODIUM   Take 1 capsule (2 mg total) by mouth 3 (three) times daily as needed for diarrhea or loose stools.      losartan 100 MG tablet   Commonly known as: COZAAR   Take 100 mg by mouth daily.      metoprolol succinate 100 MG 24 hr tablet   Commonly known as: TOPROL-XL   Take 1 tablet (100 mg total) by mouth daily. Take with or  immediately following a meal.      potassium chloride SA 20 MEQ tablet   Commonly known as: K-DUR,KLOR-CON   Take 20 mEq by mouth daily.    Flonase nasal spray, 2 sprays into the nares daily          Consults: None   Significant Diagnostic Studies:  Dg Chest 2 View  10/19/2011  *RADIOLOGY REPORT*  Clinical Data: Cough for 9 days  CHEST - 2 VIEW  Comparison: Chest x-ray of 01/14/2011  Findings: The lungs are clear.  Cardiomegaly is stable.  There is no evidence of congestive heart failure.  There are diffuse degenerative changes throughout the thoracic spine.  IMPRESSION: Cardiomegaly.  No active lung disease.  Original Report Authenticated By: Juline Patch, M.D.   Ct Angio Chest W/cm &/or Wo Cm  10/19/2011  *RADIOLOGY REPORT*  Clinical Data: Shortness of breath.  Chest pain.  CT ANGIOGRAPHY CHEST  Technique:  Multidetector CT imaging of the chest using the standard protocol during bolus administration of intravenous contrast. Multiplanar reconstructed images  including MIPs were obtained and reviewed to evaluate the vascular anatomy.  Contrast: OMNIPAQUE IOHEXOL 350 MG/ML SOLN  Comparison: 12/06/2003; 10/19/2011.  Findings: Prominent thyroid goiter with scattered calcifications again noted, with considerably thickened isthmus.  Technical factors related to patient body habitus reduce diagnostic sensitivity and specificity.  No filling defect is identified in the pulmonary arterial tree to suggest pulmonary embolus.  No pathologic thoracic adenopathy is observed.  Interventricular septal thickness of 2.4 cm is compatible with left ventricular hypertrophy.  There is confluent nodular airspace opacity in the superior segment left lower lobe, images 37-45 of series 5, suspicious for early pneumonia.  There is some mild adjacent atelectasis in the left lower lobe.  Diffuse hepatic steatosis noted.  IMPRESSION:  1. No filling defect is identified in the pulmonary arterial tree to suggest  pulmonary embolus. 2.  Confluents of nodular airspace opacity in the left lower lobe is suspicious for early pneumonia. 3.  Left ventricular hypertrophy. 4.  Prominent thyroid goiter, as shown on prior exam of 2005. 5.  Diffuse hepatic steatosis.  Original Report Authenticated By: Dellia Cloud, M.D.    Brief H and P: For complete details please refer to admission H and P, but in brief  61 year-old female with known history of diabetes mellitus2, hypertension, hyperlipidemia and multiple sclerosis was referred from the urgent care because of increased blood pressure. Patient has been experiencing cough with productive sputum for a week and a half. She had called her cardiologist today who advised her to go to urgent care. At the urgent care her blood pressure was found to be very high and was referred to the ER. Patient denies any chest pain or shortness of breath. Has been having productive sputum sometimes blood-tinged. In the ER patient was found to have systolic blood pressure more than 210 and diastolic blood pressures more than 110. Labetalol IV was given. And CT angiogram of the chest was done to rule out pulmonary embolism and it showed possible developing pneumonia. Patient will be admitted for further. Patient states she had ran out of her antihypertensives and has not taken it for one day.    Hospital Course:  Community-acquired pneumonia - patient was treated with Avelox for 3 days , will discharge on Levaquin for 4 more days to complete 7 days of therapy. Patient's symptoms significantly improved. Accelerated hypertension due to noncompliance -improved currently controlled, patient was receiving multiple antihypertensive medications and home including metoprolol and Bystolic and since both her beta blockers I will discontinue Bystolic and increase metoprolol dose 100 mg. Patient advised to check her blood pressure and heart rate daily and follow with her PCP. Diarrhea: Patient was  complaining of watery diarrhea .  Stool forC. difficile was negative, patient was given one dose of Imodium and had diarrhea resolved.  stool culture, ova and parasite  ordered however patient was unable to provide samples, patient advised to follow with her PCP if diarrhea recurs.  Hypokalemia: Was repleted , patient to follow with her PCP for repeat blood work.  Diabetes mellitus2 - continue home medications, Lantus dose was confirmed with the patient.advised to  Monitor CBGs  at home and discuss with PCP.  History of hyperlipidemia - continued on  statins.  History of multiple sclerosis: Continued on  Avonex  Nonsustained V. tach:  patient had a Short run  Of nonsustained V. tach during this hospitalization, she was asymptomatic.  she was observed on telemetry, a no recurrence noted. continue metoprolol.  Physical  deconditioning: PT Was consulted and they recommended home health PT and rolling walker, both ordered.    subjective:   patient seen and examined, complaining of mild sinus pressure which she attributed to her allergies.    Filed Vitals:   10/23/11 0848  BP: 130/79  Pulse: 69  Temp: 98.3 F (36.8 C)  Resp: 22    General: Alert, awake, oriented x3, in no acute distress. HEENT: No bruits, no goiter. Heart: Regular rate and rhythm, without murmurs, rubs, gallops. Lungs: Clear to auscultation bilaterally. Abdomen: Soft, nontender, nondistended, positive bowel sounds. Extremities: No clubbing cyanosis or edema with positive pedal pulses. Neuro: Grossly intact, nonfocal.   Disposition and Follow-up: To home with home health PT   follow his PCP ASAP    Time spent on Discharge: approximately 45 minutes    Signed: Tiawana Forgy 10/23/2011, 11:27 AM

## 2011-10-24 NOTE — ED Provider Notes (Signed)
Medical screening examination/treatment/procedure(s) were conducted as a shared visit with non-physician practitioner(s) and myself.  I personally evaluated the patient during the encounter. Shortness of breath. No PE. Hypertension uncontrolled  [name] Fry. Rubin Payor, MD 10/24/11 [phone]

## 2011-11-18 ENCOUNTER — Other Ambulatory Visit: Payer: Self-pay | Admitting: Nephrology

## 2011-12-31 ENCOUNTER — Other Ambulatory Visit: Payer: Self-pay | Admitting: Internal Medicine

## 2012-03-01 ENCOUNTER — Other Ambulatory Visit: Payer: Self-pay | Admitting: Nephrology

## 2012-05-06 ENCOUNTER — Encounter (HOSPITAL_COMMUNITY): Payer: Self-pay | Admitting: Emergency Medicine

## 2012-05-06 ENCOUNTER — Inpatient Hospital Stay (HOSPITAL_COMMUNITY)
Admission: EM | Admit: 2012-05-06 | Discharge: 2012-05-15 | DRG: 208 | Disposition: A | Discharge status: Home / Self Care | Payer: 59 | Attending: Internal Medicine | Admitting: Internal Medicine

## 2012-05-06 ENCOUNTER — Emergency Department (HOSPITAL_COMMUNITY): Payer: 59

## 2012-05-06 ENCOUNTER — Inpatient Hospital Stay (HOSPITAL_COMMUNITY): Payer: 59

## 2012-05-06 DIAGNOSIS — E118 Type 2 diabetes mellitus with unspecified complications: Secondary | ICD-10-CM | POA: Diagnosis present

## 2012-05-06 DIAGNOSIS — J4489 Other specified chronic obstructive pulmonary disease: Secondary | ICD-10-CM | POA: Diagnosis present

## 2012-05-06 DIAGNOSIS — I214 Non-ST elevation (NSTEMI) myocardial infarction: Secondary | ICD-10-CM | POA: Diagnosis present

## 2012-05-06 DIAGNOSIS — J810 Acute pulmonary edema: Secondary | ICD-10-CM

## 2012-05-06 DIAGNOSIS — IMO0002 Reserved for concepts with insufficient information to code with codable children: Secondary | ICD-10-CM | POA: Diagnosis present

## 2012-05-06 DIAGNOSIS — I5033 Acute on chronic diastolic (congestive) heart failure: Secondary | ICD-10-CM | POA: Diagnosis present

## 2012-05-06 DIAGNOSIS — J9690 Respiratory failure, unspecified, unspecified whether with hypoxia or hypercapnia: Secondary | ICD-10-CM

## 2012-05-06 DIAGNOSIS — Y849 Medical procedure, unspecified as the cause of abnormal reaction of the patient, or of later complication, without mention of misadventure at the time of the procedure: Secondary | ICD-10-CM | POA: Diagnosis not present

## 2012-05-06 DIAGNOSIS — Z23 Encounter for immunization: Secondary | ICD-10-CM

## 2012-05-06 DIAGNOSIS — E669 Obesity, unspecified: Secondary | ICD-10-CM

## 2012-05-06 DIAGNOSIS — J9600 Acute respiratory failure, unspecified whether with hypoxia or hypercapnia: Principal | ICD-10-CM | POA: Diagnosis present

## 2012-05-06 DIAGNOSIS — J95851 Ventilator associated pneumonia: Secondary | ICD-10-CM | POA: Diagnosis not present

## 2012-05-06 DIAGNOSIS — J449 Chronic obstructive pulmonary disease, unspecified: Secondary | ICD-10-CM | POA: Diagnosis present

## 2012-05-06 DIAGNOSIS — E049 Nontoxic goiter, unspecified: Secondary | ICD-10-CM | POA: Diagnosis present

## 2012-05-06 DIAGNOSIS — E785 Hyperlipidemia, unspecified: Secondary | ICD-10-CM | POA: Diagnosis present

## 2012-05-06 DIAGNOSIS — I5031 Acute diastolic (congestive) heart failure: Secondary | ICD-10-CM

## 2012-05-06 DIAGNOSIS — G35 Multiple sclerosis: Secondary | ICD-10-CM | POA: Diagnosis present

## 2012-05-06 DIAGNOSIS — D72829 Elevated white blood cell count, unspecified: Secondary | ICD-10-CM | POA: Diagnosis present

## 2012-05-06 DIAGNOSIS — Y9210 Unspecified residential institution as the place of occurrence of the external cause: Secondary | ICD-10-CM | POA: Clinically undetermined

## 2012-05-06 DIAGNOSIS — I119 Hypertensive heart disease without heart failure: Secondary | ICD-10-CM | POA: Diagnosis present

## 2012-05-06 DIAGNOSIS — J9601 Acute respiratory failure with hypoxia: Secondary | ICD-10-CM | POA: Diagnosis present

## 2012-05-06 DIAGNOSIS — IMO0001 Reserved for inherently not codable concepts without codable children: Secondary | ICD-10-CM

## 2012-05-06 DIAGNOSIS — I10 Essential (primary) hypertension: Secondary | ICD-10-CM | POA: Diagnosis present

## 2012-05-06 DIAGNOSIS — I43 Cardiomyopathy in diseases classified elsewhere: Secondary | ICD-10-CM | POA: Diagnosis present

## 2012-05-06 DIAGNOSIS — R1310 Dysphagia, unspecified: Secondary | ICD-10-CM | POA: Diagnosis present

## 2012-05-06 DIAGNOSIS — Z794 Long term (current) use of insulin: Secondary | ICD-10-CM

## 2012-05-06 DIAGNOSIS — Z79899 Other long term (current) drug therapy: Secondary | ICD-10-CM

## 2012-05-06 DIAGNOSIS — I110 Hypertensive heart disease with heart failure: Secondary | ICD-10-CM | POA: Diagnosis present

## 2012-05-06 DIAGNOSIS — Z6841 Body Mass Index (BMI) 40.0 and over, adult: Secondary | ICD-10-CM

## 2012-05-06 DIAGNOSIS — G4700 Insomnia, unspecified: Secondary | ICD-10-CM | POA: Diagnosis present

## 2012-05-06 DIAGNOSIS — F17200 Nicotine dependence, unspecified, uncomplicated: Secondary | ICD-10-CM | POA: Diagnosis present

## 2012-05-06 DIAGNOSIS — J189 Pneumonia, unspecified organism: Secondary | ICD-10-CM

## 2012-05-06 HISTORY — DX: Other hypertrophic cardiomyopathy: I42.2

## 2012-05-06 HISTORY — DX: Pneumonia, unspecified organism: J18.9

## 2012-05-06 HISTORY — DX: Hypertensive heart disease without heart failure: I11.9

## 2012-05-06 HISTORY — DX: Nontoxic goiter, unspecified: E04.9

## 2012-05-06 HISTORY — PX: CARDIAC CATHETERIZATION: SHX172

## 2012-05-06 LAB — POCT I-STAT 3, ART BLOOD GAS (G3+)
Bicarbonate: 28.3 mEq/L — ABNORMAL HIGH (ref 20.0–24.0)
Bicarbonate: 25.8 mEq/L — ABNORMAL HIGH (ref 20.0–24.0)
TCO2: 27 mmol/L (ref 0–100)
pCO2 arterial: 46.1 mmHg — ABNORMAL HIGH (ref 35.0–45.0)
pH, Arterial: 7.217 — ABNORMAL LOW (ref 7.350–7.450)
pH, Arterial: 7.357 (ref 7.350–7.450)
pO2, Arterial: 72 mmHg — ABNORMAL LOW (ref 80.0–100.0)

## 2012-05-06 LAB — COMPREHENSIVE METABOLIC PANEL
ALT: 40 U/L — ABNORMAL HIGH (ref 0–35)
Albumin: 3 g/dL — ABNORMAL LOW (ref 3.5–5.2)
Alkaline Phosphatase: 116 U/L (ref 39–117)
BUN: 11 mg/dL (ref 6–23)
Chloride: 100 mEq/L (ref 96–112)
GFR calc Af Amer: 77 mL/min — ABNORMAL LOW (ref >90)
Glucose, Bld: 426 mg/dL — ABNORMAL HIGH (ref 70–99)
Potassium: 3.7 mEq/L (ref 3.5–5.1)
Total Bilirubin: 0.5 mg/dL (ref 0.3–1.2)

## 2012-05-06 LAB — GLUCOSE, CAPILLARY
Glucose-Capillary: 399 mg/dL — ABNORMAL HIGH (ref 70–99)
Glucose-Capillary: 352 mg/dL — ABNORMAL HIGH (ref 70–99)
Glucose-Capillary: 214 mg/dL — ABNORMAL HIGH (ref 70–99)
Glucose-Capillary: 246 mg/dL — ABNORMAL HIGH (ref 70–99)
Glucose-Capillary: 209 mg/dL — ABNORMAL HIGH (ref 70–99)
Glucose-Capillary: 173 mg/dL — ABNORMAL HIGH (ref 70–99)
Glucose-Capillary: 171 mg/dL — ABNORMAL HIGH (ref 70–99)

## 2012-05-06 LAB — URINALYSIS, ROUTINE W REFLEX MICROSCOPIC
Glucose, UA: >1000 mg/dL — AB
Specific Gravity, Urine: 1.011 (ref 1.005–1.030)
pH: 7 (ref 5.0–8.0)

## 2012-05-06 LAB — MRSA PCR SCREENING: MRSA by PCR: NEGATIVE

## 2012-05-06 LAB — URINE MICROSCOPIC-ADD ON

## 2012-05-06 LAB — POCT I-STAT 3, VENOUS BLOOD GAS (G3P V)
Bicarbonate: 29.3 mEq/L — ABNORMAL HIGH (ref 20.0–24.0)
TCO2: 32 mmol/L (ref 0–100)
pCO2, Ven: 92.1 mmHg (ref 45.0–50.0)
pH, Ven: 7.111 — CL (ref 7.250–7.300)

## 2012-05-06 LAB — CBC WITH DIFFERENTIAL/PLATELET
HCT: 47.7 % — ABNORMAL HIGH (ref 36.0–46.0)
Hemoglobin: 15.7 g/dL — ABNORMAL HIGH (ref 12.0–15.0)
Lymphocytes Relative: 22 % (ref 12–46)
Monocytes Absolute: 1.2 10*3/uL — ABNORMAL HIGH (ref 0.1–1.0)
Monocytes Relative: 4 % (ref 3–12)
Neutro Abs: 21.9 10*3/uL — ABNORMAL HIGH (ref 1.7–7.7)
RBC: 4.99 MIL/uL (ref 3.87–5.11)
WBC: 30.3 10*3/uL — ABNORMAL HIGH (ref 4.0–10.5)

## 2012-05-06 LAB — MAGNESIUM: Magnesium: 1.9 mg/dL (ref 1.5–2.5)

## 2012-05-06 LAB — TROPONIN I
Troponin I: 1.41 ng/mL (ref <0.30)
Troponin I: 1.2 ng/mL (ref <0.30)

## 2012-05-06 LAB — POCT I-STAT TROPONIN I

## 2012-05-06 LAB — CORTISOL: Cortisol, Plasma: 29.7 ug/dL

## 2012-05-06 LAB — LACTIC ACID, PLASMA: Lactic Acid, Venous: 4.3 mmol/L — ABNORMAL HIGH (ref 0.5–2.2)

## 2012-05-06 LAB — PHOSPHORUS: Phosphorus: 5.2 mg/dL — ABNORMAL HIGH (ref 2.3–4.6)

## 2012-05-06 LAB — STREP PNEUMONIAE URINARY ANTIGEN: Strep Pneumo Urinary Antigen: NEGATIVE

## 2012-05-06 MED ORDER — FENTANYL CITRATE 0.05 MG/ML IJ SOLN
100.0000 ug | Freq: Once | INTRAMUSCULAR | Status: DC
  Filled 2012-05-06 (×4): qty 2

## 2012-05-06 MED ORDER — FENTANYL CITRATE 0.05 MG/ML IJ SOLN
100.0000 ug | Freq: Once | INTRAMUSCULAR | Status: AC
  Administered 2012-05-06: 100 ug via INTRAVENOUS

## 2012-05-06 MED ORDER — PROPOFOL 10 MG/ML IV EMUL: 5.0000 ug/kg/min | INTRAVENOUS | Status: DC

## 2012-05-06 MED ORDER — NITROGLYCERIN IN D5W 200-5 MCG/ML-% IV SOLN: 20.0000 ug/min | Freq: Once | INTRAVENOUS | Status: AC

## 2012-05-06 MED ORDER — VECURONIUM BROMIDE 10 MG IV SOLR
10.0000 mg | Freq: Once | INTRAVENOUS | Status: AC
  Administered 2012-05-06: 10 mg via INTRAVENOUS

## 2012-05-06 MED ORDER — NITROGLYCERIN IN D5W 200-5 MCG/ML-% IV SOLN
20.0000 ug/min | INTRAVENOUS | Status: DC
  Administered 2012-05-06 (×2): 20 ug/min via INTRAVENOUS

## 2012-05-06 MED ORDER — MIDAZOLAM HCL 2 MG/2ML IJ SOLN
INTRAMUSCULAR | Status: AC
  Administered 2012-05-06: 2 mg via INTRAVENOUS
  Filled 2012-05-06: qty 2

## 2012-05-06 MED ORDER — NITROGLYCERIN IN D5W 200-5 MCG/ML-% IV SOLN
INTRAVENOUS | Status: AC
  Filled 2012-05-06: qty 250

## 2012-05-06 MED ORDER — MAGNESIUM SULFATE IN D5W 10-5 MG/ML-% IV SOLN
1.0000 g | Freq: Once | INTRAVENOUS | Status: AC
  Administered 2012-05-06: 1 g via INTRAVENOUS
  Filled 2012-05-06: qty 100

## 2012-05-06 MED ORDER — SUCCINYLCHOLINE CHLORIDE 20 MG/ML IJ SOLN
INTRAMUSCULAR | Status: AC
  Filled 2012-05-06: qty 1

## 2012-05-06 MED ORDER — METOPROLOL TARTRATE 50 MG PO TABS: 50.0000 mg | ORAL_TABLET | Freq: Two times a day (BID) | ORAL | Status: DC

## 2012-05-06 MED ORDER — INFLUENZA VIRUS VACC SPLIT PF IM SUSP
0.5000 mL | INTRAMUSCULAR | Status: AC
  Filled 2012-05-06: qty 0.5

## 2012-05-06 MED ORDER — FUROSEMIDE 10 MG/ML IJ SOLN
80.0000 mg | Freq: Three times a day (TID) | INTRAMUSCULAR | Status: DC
  Administered 2012-05-06 – 2012-05-07 (×4): 80 mg via INTRAVENOUS
  Filled 2012-05-06 (×4): qty 8
  Filled 2012-05-06: qty 4
  Filled 2012-05-06: qty 8

## 2012-05-06 MED ORDER — POTASSIUM CHLORIDE 20 MEQ/15ML (10%) PO LIQD
20.0000 meq | Freq: Three times a day (TID) | ORAL | Status: DC
  Administered 2012-05-06 – 2012-05-08 (×6): 20 meq
  Filled 2012-05-06 (×11): qty 15

## 2012-05-06 MED ORDER — PROPOFOL 10 MG/ML IV EMUL
INTRAVENOUS | Status: AC
  Filled 2012-05-06: qty 100

## 2012-05-06 MED ORDER — ETOMIDATE 2 MG/ML IV SOLN
INTRAVENOUS | Status: AC
  Filled 2012-05-06: qty 20

## 2012-05-06 MED ORDER — NITROGLYCERIN IN D5W 200-5 MCG/ML-% IV SOLN
2.0000 ug/min | INTRAVENOUS | Status: DC
  Administered 2012-05-07: 5 ug/min via INTRAVENOUS
  Filled 2012-05-06: qty 250

## 2012-05-06 MED ORDER — FENTANYL CITRATE 0.05 MG/ML IJ SOLN
INTRAMUSCULAR | Status: AC
  Filled 2012-05-06: qty 2

## 2012-05-06 MED ORDER — ETOMIDATE 2 MG/ML IV SOLN
20.0000 mg | Freq: Once | INTRAVENOUS | Status: AC
  Administered 2012-05-06: 20 mg via INTRAVENOUS

## 2012-05-06 MED ORDER — SODIUM CHLORIDE 0.9 % IV SOLN
INTRAVENOUS | Status: DC
  Administered 2012-05-06: 16:00:00 via INTRAVENOUS

## 2012-05-06 MED ORDER — SODIUM CHLORIDE 0.9 % IV SOLN: INTRAVENOUS | Status: DC

## 2012-05-06 MED ORDER — LEVALBUTEROL HCL 0.63 MG/3ML IN NEBU
0.6300 mg | INHALATION_SOLUTION | Freq: Four times a day (QID) | RESPIRATORY_TRACT | Status: DC
  Administered 2012-05-06 – 2012-05-09 (×10): 0.63 mg via RESPIRATORY_TRACT
  Filled 2012-05-06 (×17): qty 3

## 2012-05-06 MED ORDER — PROPOFOL 10 MG/ML IV EMUL
5.0000 ug/kg/min | INTRAVENOUS | Status: DC
  Administered 2012-05-06: 30 ug/kg/min via INTRAVENOUS

## 2012-05-06 MED ORDER — FUROSEMIDE 10 MG/ML IJ SOLN
40.0000 mg | Freq: Once | INTRAMUSCULAR | Status: AC
  Administered 2012-05-06: 40 mg via INTRAVENOUS
  Filled 2012-05-06 (×2): qty 4

## 2012-05-06 MED ORDER — CHLORHEXIDINE GLUCONATE 0.12 % MT SOLN
15.0000 mL | Freq: Two times a day (BID) | OROMUCOSAL | Status: DC
  Administered 2012-05-06 – 2012-05-07 (×3): 15 mL via OROMUCOSAL
  Filled 2012-05-06 (×4): qty 15

## 2012-05-06 MED ORDER — FUROSEMIDE 10 MG/ML IJ SOLN: 80.0000 mg | Freq: Three times a day (TID) | INTRAMUSCULAR | Status: DC

## 2012-05-06 MED ORDER — HYDRALAZINE HCL 20 MG/ML IJ SOLN
INTRAMUSCULAR | Status: AC
  Administered 2012-05-07: 20 mg via INTRAVENOUS
  Filled 2012-05-06: qty 1

## 2012-05-06 MED ORDER — PNEUMOCOCCAL VAC POLYVALENT 25 MCG/0.5ML IJ INJ
0.5000 mL | INJECTION | INTRAMUSCULAR | Status: AC
  Filled 2012-05-06: qty 0.5

## 2012-05-06 MED ORDER — SUCCINYLCHOLINE CHLORIDE 20 MG/ML IJ SOLN
80.0000 mg | Freq: Once | INTRAMUSCULAR | Status: AC
  Administered 2012-05-06: 80 mg via INTRAVENOUS

## 2012-05-06 MED ORDER — LIDOCAINE HCL (CARDIAC) 20 MG/ML IV SOLN
INTRAVENOUS | Status: AC
  Filled 2012-05-06: qty 5

## 2012-05-06 MED ORDER — METOPROLOL TARTRATE 50 MG PO TABS
50.0000 mg | ORAL_TABLET | Freq: Two times a day (BID) | ORAL | Status: DC
  Administered 2012-05-06 – 2012-05-07 (×4): 50 mg via ORAL
  Filled 2012-05-06 (×6): qty 1

## 2012-05-06 MED ORDER — VECURONIUM BROMIDE 10 MG IV SOLR
INTRAVENOUS | Status: AC
  Administered 2012-05-06: 10 mg via INTRAVENOUS
  Filled 2012-05-06: qty 10

## 2012-05-06 MED ORDER — MAGNESIUM SULFATE 50 % IJ SOLN: 1.0000 g | Freq: Once | INTRAMUSCULAR | Status: DC

## 2012-05-06 MED ORDER — PANTOPRAZOLE SODIUM 40 MG IV SOLR
40.0000 mg | Freq: Every day | INTRAVENOUS | Status: DC
  Administered 2012-05-06 – 2012-05-08 (×3): 40 mg via INTRAVENOUS
  Filled 2012-05-06 (×4): qty 40

## 2012-05-06 MED ORDER — PROPOFOL 10 MG/ML IV EMUL
INTRAVENOUS | Status: AC
  Administered 2012-05-06: 30 ug/kg/min via INTRAVENOUS
  Filled 2012-05-06: qty 100

## 2012-05-06 MED ORDER — BIOTENE DRY MOUTH MT LIQD
15.0000 mL | Freq: Four times a day (QID) | OROMUCOSAL | Status: DC
  Administered 2012-05-06 – 2012-05-08 (×9): 15 mL via OROMUCOSAL

## 2012-05-06 MED ORDER — MIDAZOLAM HCL 2 MG/2ML IJ SOLN
2.0000 mg | Freq: Once | INTRAMUSCULAR | Status: AC
  Administered 2012-05-06: 2 mg via INTRAVENOUS

## 2012-05-06 MED ORDER — FENTANYL CITRATE 0.05 MG/ML IJ SOLN
50.0000 ug | INTRAMUSCULAR | Status: DC | PRN
  Administered 2012-05-06 – 2012-05-07 (×7): 100 ug via INTRAVENOUS
  Administered 2012-05-07 (×2): 50 ug via INTRAVENOUS
  Administered 2012-05-07 – 2012-05-08 (×9): 100 ug via INTRAVENOUS
  Administered 2012-05-08: 50 ug via INTRAVENOUS
  Administered 2012-05-08: 100 ug via INTRAVENOUS
  Filled 2012-05-06 (×17): qty 2

## 2012-05-06 MED ORDER — SODIUM CHLORIDE 0.9 % IV SOLN
INTRAVENOUS | Status: DC
  Administered 2012-05-06: 1.5 [IU]/h via INTRAVENOUS
  Filled 2012-05-06: qty 1

## 2012-05-06 MED ORDER — ROCURONIUM BROMIDE 50 MG/5ML IV SOLN
INTRAVENOUS | Status: AC
  Filled 2012-05-06: qty 2

## 2012-05-06 MED ORDER — HYDRALAZINE HCL 20 MG/ML IJ SOLN
10.0000 mg | INTRAMUSCULAR | Status: DC | PRN
  Administered 2012-05-06 – 2012-05-07 (×2): 20 mg via INTRAVENOUS
  Filled 2012-05-06 (×2): qty 1

## 2012-05-06 NOTE — Procedures (Signed)
Arterial Catheter Insertion Procedure Note RANDELL DETTER [id number] 1951-02-07  Procedure: Insertion of Arterial Catheter  Indications: Blood pressure monitoring and Frequent blood sampling  Procedure Details Consent: Risks of procedure as well as the alternatives and risks of each were explained to the (patient/caregiver).  Consent for procedure obtained.  Consent obtained from pts husband. Time Out: Verified patient identification, verified procedure, site/side was marked, verified correct patient position, special equipment/implants available, medications/allergies/relevent history reviewed, required imaging and test results available.  Performed  Maximum sterile technique was used including antiseptics, cap, gloves, gown, hand hygiene, mask and sheet. Skin prep: Chlorhexidine; local anesthetic administered 20 gauge catheter was inserted into right radial artery using the Seldinger technique.  Evaluation Blood flow good; BP tracing good. Complications: No apparent complications.   Antoine Poche 05/06/2012

## 2012-05-06 NOTE — ED Notes (Signed)
CBG 399 

## 2012-05-06 NOTE — Procedures (Signed)
STAFF niote  Personally  Supervised procedure. Real time 2D ultrasound used for vein site selection, patency assessment, and needle entry. A record of image was made and submitted for record filing / A record of image was made but could not be submitted for filing due to malfunction of printing device   Dr. Kalman Shan, M.D., [facility].C.P Pulmonary and Critical Care Medicine Staff Physician [facility] [place] [facility] Critical [facility] Pager: [phone], If no answer or between  15:00h - 7:00h: call [phone]  05/06/2012 12:32 PM

## 2012-05-06 NOTE — ED Notes (Signed)
MD at bedside. Cardiology 

## 2012-05-06 NOTE — Consult Note (Signed)
Reason for Consult: Respiratory failure  Requesting Physician: Claybon Jabs  HPI: This is a 61 y.o. female with a past medical history significant for HTN, DM, morbid obesity, and MS. She was followed by Dr Mervyn Skeeters. Little in the past. She had a cath in 2008 that showed normal coronaries and LVF. She nhas had difficult to control HTN. She was admitted in May 2013 with CAP and accelerated HTN secondary to non compliance (financial issues). Dr Clarene Duke saw her in the office after this and changed her medications to generic. I spoke with her husband to day and he says she has been compliant.  Today she developed a cough in the am that progressed to acute respiratory distress. She was admitted through the ER and intubated. We are asked to see for further cardiac input. The pt's husband says she has not had chest pain. He denies any recent history of fever, chills or cough prior to this am. There is no history of documented sleep apnea. He denies any recent excessive sodium intake.  PMHx:  Past Medical History  Diagnosis Date  . Diabetes mellitus   . Hypertension   . Asthma   . COPD (chronic obstructive pulmonary disease)   . Multiple sclerosis   . Goiter    Past Surgical History  Procedure Date  . Abdominal hysterectomy Nov 2005    FAMHx: Family History  Problem Relation Age of Onset  . Coronary artery disease Father 20    MI    SOCHx:  reports that she has quit smoking. Her smoking use included Cigarettes. She quit smokeless tobacco use about 7 years ago. She reports that she does not drink alcohol or use illicit drugs.  ALLERGIES: Allergies  Allergen Reactions  . Codeine Other (See Comments)    unknown  . Penicillins Other (See Comments)    unknown    ROS: Pertinent items are noted in HPI. She has a history of a goiter, which, along with her diabetes, is followed by Dr Margaretmary Bayley.  HOME MEDICATIONS:  See Med Rec  HOSPITAL MEDICATIONS: I have reviewed the patient's current  medications.  VITALS: Blood pressure 143/62, pulse 131, temperature 97.7 F (36.5 C), resp. rate 28, height 5\' 1"  (1.549 m), weight 109.77 kg (242 lb), SpO2 95.00%.  PHYSICAL EXAM: General appearance: morbidly obese and intubated, sedated Neck: goiter Lungs: diffuse rales Heart: regular rate and rhythm and decreased heart sounds Abdomen: morbidly obese Extremities: no edema Pulses: 2+ and symmetric Skin: cool and dry Neurologic: intubated, sedated, not responsive  LABS: Results for orders placed during the hospital encounter of 05/06/12 (from the past 48 hour(s))  GLUCOSE, CAPILLARY     Status: Abnormal   Collection Time   05/06/12  8:57 AM      Component Value Range Comment   Glucose-Capillary 399 (*) 70 - 99 mg/dL   CBC WITH DIFFERENTIAL     Status: Abnormal   Collection Time   05/06/12  9:11 AM      Component Value Range Comment   WBC 30.3 (*) 4.0 - 10.5 K/uL    RBC 4.99  3.87 - 5.11 MIL/uL    Hemoglobin 15.7 (*) 12.0 - 15.0 g/dL    HCT 40.9 (*) 81.1 - 46.0 %    MCV 95.6  78.0 - 100.0 fL    MCH 31.5  26.0 - 34.0 pg    MCHC 32.9  30.0 - 36.0 g/dL    RDW 91.4  78.2 - 95.6 %    Platelets 129 (*)  150 - 400 K/uL    Neutrophils Relative 72  43 - 77 %    Neutro Abs 21.9 (*) 1.7 - 7.7 K/uL    Lymphocytes Relative 22  12 - 46 %    Lymphs Abs 6.8 (*) 0.7 - 4.0 K/uL    Monocytes Relative 4  3 - 12 %    Monocytes Absolute 1.2 (*) 0.1 - 1.0 K/uL    Eosinophils Relative 1  0 - 5 %    Eosinophils Absolute 0.4  0.0 - 0.7 K/uL    Basophils Relative 0  0 - 1 %    Basophils Absolute 0.0  0.0 - 0.1 K/uL    Smear Review MORPHOLOGY UNREMARKABLE     COMPREHENSIVE METABOLIC PANEL     Status: Abnormal   Collection Time   05/06/12  9:11 AM      Component Value Range Comment   Sodium 139  135 - 145 mEq/L    Potassium 3.7  3.5 - 5.1 mEq/L    Chloride 100  96 - 112 mEq/L    CO2 24  19 - 32 mEq/L    Glucose, Bld 426 (*) 70 - 99 mg/dL    BUN 11  6 - 23 mg/dL    Creatinine, Ser 8.11  0.50 -  1.10 mg/dL    Calcium 8.3 (*) 8.4 - 10.5 mg/dL    Total Protein 7.7  6.0 - 8.3 g/dL    Albumin 3.0 (*) 3.5 - 5.2 g/dL    AST 53 (*) 0 - 37 U/L    ALT 40 (*) 0 - 35 U/L    Alkaline Phosphatase 116  39 - 117 U/L    Total Bilirubin 0.5  0.3 - 1.2 mg/dL    GFR calc non Af Amer 66 (*) >90 mL/min    GFR calc Af Amer 77 (*) >90 mL/min   LACTIC ACID, PLASMA     Status: Abnormal   Collection Time   05/06/12  9:32 AM      Component Value Range Comment   Lactic Acid, Venous 4.3 (*) 0.5 - 2.2 mmol/L   URINALYSIS, ROUTINE W REFLEX MICROSCOPIC     Status: Abnormal   Collection Time   05/06/12  9:42 AM      Component Value Range Comment   Color, Urine YELLOW  YELLOW    APPearance CLEAR  CLEAR    Specific Gravity, Urine 1.011  1.005 - 1.030    pH 7.0  5.0 - 8.0    Glucose, UA >1000 (*) NEGATIVE mg/dL    Hgb urine dipstick SMALL (*) NEGATIVE    Bilirubin Urine NEGATIVE  NEGATIVE    Ketones, ur NEGATIVE  NEGATIVE mg/dL    Protein, ur 914 (*) NEGATIVE mg/dL    Urobilinogen, UA 0.2  0.0 - 1.0 mg/dL    Nitrite NEGATIVE  NEGATIVE    Leukocytes, UA NEGATIVE  NEGATIVE   URINE MICROSCOPIC-ADD ON     Status: Abnormal   Collection Time   05/06/12  9:42 AM      Component Value Range Comment   Squamous Epithelial / LPF RARE  RARE    WBC, UA 0-2  <3 WBC/hpf    RBC / HPF 3-6  <3 RBC/hpf    Casts GRANULAR CAST (*) NEGATIVE   POCT I-STAT 3, BLOOD GAS (G3P V)     Status: Abnormal   Collection Time   05/06/12  9:43 AM      Component Value Range Comment  pH, Ven 7.111 (*) 7.250 - 7.300    pCO2, Ven 92.1 (*) 45.0 - 50.0 mmHg    pO2, Ven 61.0 (*) 30.0 - 45.0 mmHg    Bicarbonate 29.3 (*) 20.0 - 24.0 mEq/L    TCO2 32  0 - 100 mmol/L    O2 Saturation 79.0      Acid-base deficit 4.0 (*) 0.0 - 2.0 mmol/L    Sample type VENOUS      Comment NOTIFIED PHYSICIAN     POCT I-STAT TROPONIN I     Status: Abnormal   Collection Time   05/06/12  9:43 AM      Component Value Range Comment   Troponin i, poc 0.17 (*) 0.00  - 0.08 ng/mL    Comment NOTIFIED PHYSICIAN      Comment 3            MAGNESIUM     Status: Normal   Collection Time   05/06/12 10:32 AM      Component Value Range Comment   Magnesium 1.9  1.5 - 2.5 mg/dL   PHOSPHORUS     Status: Abnormal   Collection Time   05/06/12 10:32 AM      Component Value Range Comment   Phosphorus 5.2 (*) 2.3 - 4.6 mg/dL   PROCALCITONIN     Status: Normal   Collection Time   05/06/12 10:33 AM      Component Value Range Comment   Procalcitonin <0.10     POCT I-STAT 3, BLOOD GAS (G3+)     Status: Abnormal   Collection Time   05/06/12 10:53 AM      Component Value Range Comment   pH, Arterial 7.217 (*) 7.350 - 7.450    pCO2 arterial 69.6 (*) 35.0 - 45.0 mmHg    pO2, Arterial 72.0 (*) 80.0 - 100.0 mmHg    Bicarbonate 28.3 (*) 20.0 - 24.0 mEq/L    TCO2 30  0 - 100 mmol/L    O2 Saturation 90.0      Acid-base deficit 2.0  0.0 - 2.0 mmol/L    Sample type ARTERIAL      Comment NOTIFIED PHYSICIAN     TROPONIN I     Status: Normal   Collection Time   05/06/12 11:07 AM      Component Value Range Comment   Troponin I <0.30  <0.30 ng/mL     IMAGING: Dg Chest Portable 1 View  05/06/2012  *RADIOLOGY REPORT*  Clinical Data: Central venous catheter placement bedside.  PORTABLE CHEST - 1 VIEW 05/06/2012 1118 hours:  Comparison: Portable chest x-ray earlier same day 0919 hours.  Findings: Right jugular central venous catheter tip projects over the SVC (patient rotated to the left).  Cardiomegaly and dense airspace consolidation throughout both lungs, unchanged.  No evidence of pneumothorax mediastinal hematoma.  Endotracheal tube tip remains in satisfactory position approximately 4 cm above the carina.  Nasogastric tube courses below the diaphragm.  IMPRESSION:  1.  Right jugular central venous catheter tip projects over the SVC.  No acute complicating features. 2.  Remaining support apparatus satisfactory. 3.  Stable severe bilateral pneumonia/edema/ARDS.   Original Report  Authenticated By: Hulan Saas, M.D.    Dg Chest Portable 1 View  05/06/2012  *RADIOLOGY REPORT*  Clinical Data: Short of breath  PORTABLE CHEST - 1 VIEW  Comparison: Prior chest x-ray and CT scan of the chest 10/19/2011  Findings: The patient is intubated.  The carina is difficult to identify, the tip of  the endotracheal tube is likely just above the carina.  A nasogastric tube is in place, the tip lies below the diaphragm off the field of view.  Diffuse bilateral interstitial opacities with confluent airspace disease in the bilateral lower lobes.  Cardiomegaly appears slightly progressed compared to the prior study.  No large pleural effusion or pneumothorax identified.  IMPRESSION:  1.  Interval development of diffuse bilateral interstitial opacities with more confluent airspace disease in the bilateral lower lobes.  In the setting of slightly progressed cardiomegaly, findings are most consistent with congestive heart failure and acute  pulmonary edema.  Additional differential considerations include atypical infection, multilobar pneumonia, and in the appropriate clinical setting - aspiration.  2.  Interval intubation.  The tip of the endotracheal tube is low, likely just above the carina.  Recommend withdrawing 2 cm for more optimal placement.   Original Report Authenticated By: Malachy Moan, M.D.     IMPRESSION: Principal Problem:  *Respiratory failure Active Problems:  Uncontrolled hypertension  Acute pulmonary edema  Acute on chronic diastolic HF (heart failure)  HTN (hypertension), malignant  Diabetes mellitus type 2 in obese  Morbid obesity  Normal coronary arteries by cath 2008; Normal EF; moderate LVH on Echo  Hyperlipidemia  Multiple sclerosis    RECOMMENDATION: Echo pending. We will follow with you. She will need sleep study at some point. Currently on IV NTG. She is diuresing after IV Lasix.  Time Spent Directly with Patient: 40 minutes  Jathan Balling W 05/06/2012,  12:54 PM   I have seen and evaluated the patient this morning along with Corine Shelter, PA. I agree with his findings, examination as well as impression recommendations.  61 y/o morbidly obese woman -- former patient of Dr. Caprice Kluver (not seen in ~3-4 yrs) with long-standing, difficult to control severe HTN.  She had a cardiac cath in 2008 with no evidence of CAD or RAS.  Last recorded echo was 2005 - moderate LVH at that time with normal EF.  She presented with acute pulmonary edema in the setting of Malignant / Accelerated HTN -- Hypertensive Crisis with acute on chronic Diastolic HF.  According to her husband, she is compliant with medications -- which is likely the case since it has been years since her last CHF admission (which also required intubation).  She was apparently in her USOH until this AM.  Her husband does note that she went with her sons to for Thanksgiving festivities & is unsure of how much or what she ate -- raising the suspicion of dietary indiscretion leading to volume overload and acute on chronic diastolic HF.  Agree with current management -- diuresing well with IF Lasix.  BP much improved after Hydralazine & NTG gtt.  HR is elevated -- would not hold BB (is on long actin Toprol @ home -- may need to switch to Lopressor per NGT until extubated) -- have ordered BID 50mg  Lopressor.  We will follow along.  Echo ordered, but not yet done.  Will look for this in AM prior to rounding.  Marykay Lex, M.D., M.S. [facility] [address] [place], [place]  [address]  [phone] Pager # [phone] 05/06/2012 12:58 PM

## 2012-05-06 NOTE — ED Notes (Signed)
Pt intubated by Dr. Fredderick Phenix with 7.5 ETT tube, 25 @ lip.

## 2012-05-06 NOTE — H&P (Signed)
PULMONARY  / CRITICAL CARE MEDICINE  Name: [name] Fry MRN: [id number] DOB: 1951/01/28    LOS: 0  REFERRING MD : EDP  CHIEF COMPLAINT:  Acute resp failure  BRIEF PATIENT DESCRIPTION:  61 yo MO AAF with multiple medical problems including MS/HTN/, smoker, presented to [facility] 12-1 0900 with acute SOB. Her husband found her tripoding at home and called ems to transported Intubated in ED and found to have pink frothy sputum and was urgently intubated. PCCm asked to admit  LINES / TUBES: 12-1 ott>> 12-1 CVL>> 12-1 a line  CULTURES: 12-1 sputum>> 12-1 uc>> 12-1 bcx2>>   Lab 05/06/12 1033  PROCALCITON <0.10     ANTIBIOTICS: None Anti-infectives    None       SIGNIFICANT EVENTS:  12-11 intubated  LEVEL OF CARE:  ICU PRIMARY SERVICE:  PCCM CONSULTANTS:  SVC CODE STATUS: full DIET:  npo DVT Px:  pas GI Px:  PPI  HISTORY OF PRESENT ILLNESS:   61 yo MO AAF with multiple medical problems including MS/HTN/, smoker, presented to [facility] 12-1 0900 with acute SOB. Her husband found her tripoding at home and called ems to transported Intubated in ED and found to have pink frothy sputum and was urgently intubated. PCCm asked to admit    PAST MEDICAL HISTORY :  Past Medical History  Diagnosis Date  . Diabetes mellitus   . Hypertension   . Asthma   . COPD (chronic obstructive pulmonary disease)   . Multiple sclerosis   . Goiter    Past Surgical History  Procedure Date  . Abdominal hysterectomy Nov 2005   Prior to Admission medications   Medication Sig Start Date End Date Taking? Authorizing Provider  interferon beta-1a (AVONEX) 30 MCG/0.5ML injection Inject 30 mcg into the muscle every 7 (seven) days.   Yes Historical Provider, MD  aliskiren (TEKTURNA) 300 MG tablet Take 300 mg by mouth daily.    Historical Provider, MD  amLODipine (NORVASC) 10 MG tablet Take 10 mg by mouth daily.    Historical Provider, MD  atorvastatin (LIPITOR) 20 MG tablet Take 20 mg by mouth  daily.    Historical Provider, MD  fluticasone (FLONASE) 50 MCG/ACT nasal spray Place 2 sprays into the nose daily. 10/23/11 10/22/12  Antonieta Pert, MD  furosemide (LASIX) 40 MG tablet Take 40 mg by mouth daily.    Historical Provider, MD  insulin glargine (LANTUS) 100 UNIT/ML injection Inject 60-70 Units into the skin 2 (two) times daily. Inject 70 units in the morning and 60 units at bedtime 10/23/11 10/22/12  Antonieta Pert, MD  isosorbide-hydrALAZINE (BIDIL) 20-37.5 MG per tablet Take 1 tablet by mouth 3 (three) times daily.    Historical Provider, MD  lisinopril (PRINIVIL,ZESTRIL) 20 MG tablet Take 40 mg by mouth daily.    Historical Provider, MD  losartan (COZAAR) 100 MG tablet Take 100 mg by mouth daily.    Historical Provider, MD  metoprolol succinate (TOPROL-XL) 100 MG 24 hr tablet Take 1 tablet (100 mg total) by mouth daily. Take with or immediately following a meal. 10/23/11   Antonieta Pert, MD  potassium chloride SA (K-DUR,KLOR-CON) 20 MEQ tablet Take 20 mEq by mouth daily.    Historical Provider, MD   Allergies  Allergen Reactions  . Codeine Other (See Comments)    unknown  . Penicillins Other (See Comments)    unknown    FAMILY HISTORY:  Family History  Problem Relation Age of Onset  . Coronary artery disease  Father 80    MI   SOCIAL HISTORY:  reports that she has quit smoking. Her smoking use included Cigarettes. She quit smokeless tobacco use about 7 years ago. She reports that she does not drink alcohol or use illicit drugs.  REVIEW OF SYSTEMS:  NA   INTERVAL HISTORY:   VITAL SIGNS: Temp:  [97.7 F (36.5 C)-99.1 F (37.3 C)] 97.7 F (36.5 C) (12/01 1100) Pulse Rate:  [111-143] 131  (12/01 1100) Resp:  [12-37] 28  (12/01 1100) BP: (126-269)/(62-202) 143/62 mmHg (12/01 1100) SpO2:  [78 %-100 %] 95 % (12/01 1100) Arterial Line BP: (181-224)/(80-101) 224/89 mmHg (12/01 1100) FiO2 (%):  [100 %] 100 % (12/01 0900) Weight:  [109.77 kg (242 lb)] 109.77 kg (242  lb) (12/01 0942) HEMODYNAMICS:   VENTILATOR SETTINGS: Vent Mode:  [-] PRVC FiO2 (%):  [100 %] 100 % Set Rate:  [15 bmp-30 bmp] 30 bmp Vt Set:  [380 mL-470 mL] 380 mL PEEP:  [5 cmH20] 5 cmH20 Plateau Pressure:  [37 cmH20] 37 cmH20 INTAKE / OUTPUT: Intake/Output      11/30 0701 - 12/01 0700 12/01 0701 - 12/02 0700   Urine (mL/kg/hr)  1000 (1.7)   Total Output  1000   Net  -1000          PHYSICAL EXAMINATION: General:  MOAAF intubated and sedated Neuro:  Sedated HEENT:  No JVD, NO LAN Cardiovascular:  hsr rrr Lungs:  Coarse rhonchi bilateral with frothy pink sputum Abdomen:  Obese Musculoskeletal:  intact Skin:  warm   LABS: Cbc  Lab 05/06/12 0911  WBC 30.3*  HGB 15.7*  HCT 47.7*  PLT 129*    Chemistry   Lab 05/06/12 1032 05/06/12 0911  NA -- 139  K -- 3.7  CL -- 100  CO2 -- 24  BUN -- 11  CREATININE -- 0.92  CALCIUM -- 8.3*  MG 1.9 --  PHOS 5.2* --  GLUCOSE -- 426*    Liver fxn  Lab 05/06/12 0911  AST 53*  ALT 40*  ALKPHOS 116  BILITOT 0.5  PROT 7.7  ALBUMIN 3.0*   coags No results found for this basename: APTT:3,INR:3 in the last 168 hours Sepsis markers  Lab 05/06/12 1033 05/06/12 0932  LATICACIDVEN -- 4.3*  PROCALCITON <0.10 --   Cardiac markers  Lab 05/06/12 1107  CKTOTAL --  CKMB --  TROPONINI <0.30   BNP No results found for this basename: PROBNP:3 in the last 168 hours ABG  Lab 05/06/12 1053 05/06/12 0943  PHART 7.217* --  PCO2ART 69.6* --  PO2ART 72.0* --  HCO3 28.3* 29.3*  TCO2 30 32    CBG trend  Lab 05/06/12 0857  GLUCAP 399*    IMAGING:  Dg Chest Portable 1 View  05/06/2012  *RADIOLOGY REPORT*  Clinical Data: Central venous catheter placement bedside.  PORTABLE CHEST - 1 VIEW 05/06/2012 1118 hours:  Comparison: Portable chest x-ray earlier same day 0919 hours.  Findings: Right jugular central venous catheter tip projects over the SVC (patient rotated to the left).  Cardiomegaly and dense airspace  consolidation throughout both lungs, unchanged.  No evidence of pneumothorax mediastinal hematoma.  Endotracheal tube tip remains in satisfactory position approximately 4 cm above the carina.  Nasogastric tube courses below the diaphragm.  IMPRESSION:  1.  Right jugular central venous catheter tip projects over the SVC.  No acute complicating features. 2.  Remaining support apparatus satisfactory. 3.  Stable severe bilateral pneumonia/edema/ARDS.   Original Report Authenticated By: Maisie Fus  Lyman Bishop, M.D.    Dg Chest Portable 1 View  05/06/2012  *RADIOLOGY REPORT*  Clinical Data: Short of breath  PORTABLE CHEST - 1 VIEW  Comparison: Prior chest x-ray and CT scan of the chest 10/19/2011  Findings: The patient is intubated.  The carina is difficult to identify, the tip of the endotracheal tube is likely just above the carina.  A nasogastric tube is in place, the tip lies below the diaphragm off the field of view.  Diffuse bilateral interstitial opacities with confluent airspace disease in the bilateral lower lobes.  Cardiomegaly appears slightly progressed compared to the prior study.  No large pleural effusion or pneumothorax identified.  IMPRESSION:  1.  Interval development of diffuse bilateral interstitial opacities with more confluent airspace disease in the bilateral lower lobes.  In the setting of slightly progressed cardiomegaly, findings are most consistent with congestive heart failure and acute  pulmonary edema.  Additional differential considerations include atypical infection, multilobar pneumonia, and in the appropriate clinical setting - aspiration.  2.  Interval intubation.  The tip of the endotracheal tube is low, likely just above the carina.  Recommend withdrawing 2 cm for more optimal placement.   Original Report Authenticated By: Malachy Moan, M.D.      ECG:  DIAGNOSES: Principal Problem:  *Respiratory failure Active Problems:  Uncontrolled hypertension  Diabetes mellitus type 2 in  obese  Hyperlipidemia  Multiple sclerosis  Acute pulmonary edema  Morbid obesity  Normal coronary arteries by cath 2008   ASSESSMENT / PLAN:  PULMONARY  ASSESSMENT: Acute resp failure from Acute Pulmonary Edema PLAN:   -See vent orders -BD's -Aggressive diuresis  CARDIOVASCULAR  ASSESSMENT: HTN crisis, RO MI PLAN:  -diuretics  -antihypertensives GOAL SBP 150-170 -sedation -2 d -serial CE -Cards consult(spoke with Dr Herbie Baltimore 12-1)  RENAL  ASSESSMENT:   Lab Results  Component Value Date   CREATININE 0.92 05/06/2012   CREATININE 0.83 10/23/2011   CREATININE 0.88 10/21/2011    PLAN:   No acute issue  GASTROINTESTINAL  ASSESSMENT:  MO/NPO PLAN:   - OGT  HEMATOLOGIC  ASSESSMENT:   No acute issue PLAN:    INFECTIOUS  ASSESSMENT:   No apparent infection but will pan culture PLAN:   -pan culture  ENDOCRINE  ASSESSMENT:   DM  PLAN:   SSI  NEUROLOGIC  ASSESSMENT: AMS/ MS PLAN:   -monitor if not responsive may need CT of head in future.  CLINICAL SUMMARY: 61 yo MO AAF with recurrent ape.   Brett Canales Minor ACNP [facility] PCCM Pager [phone] till 3 pm If no answer page [phone] 05/06/2012, 12:24 PM   STAFF NOTE: I, Dr Lavinia Sharps have personally reviewed patient's available data, including medical history, events of note, physical examination and test results as part of my evaluation. I have discussed with resident/NP and other care providers such as pharmacist, RN and RRT.  In addition,  I personally evaluated patient and elicited key findings of  Acute respiratory failure with clinical evidence suggesting acute hypertensive heart failure as etiology. CXR diffuse infiltrates but we suspect CHF and not ARDs/PNA. We will control BP (goal is 225% sbp drop to 160 sbp goal) with nitro, hydralazine, lasix and fentanyl. We will not give abx at this stage but do infectious workup including PCT, urine strep and leg antigen and culture. Cards consult called   Rest per NP/medical resident whose note is outlined above and that I agree with  The patient is critically ill with multiple organ systems failure  and requires high complexity decision making for assessment and support, frequent evaluation and titration of therapies, application of advanced monitoring technologies and extensive interpretation of multiple databases.   Critical Care Time devoted to patient care services described in this note is  60  Minutes.  Dr. Kalman Shan, M.D., [facility].C.P Pulmonary and Critical Care Medicine Staff Physician [facility] Pager: [phone], If no answer or between  15:00h - 7:00h: call [phone]  05/06/2012 12:25 PM

## 2012-05-06 NOTE — Procedures (Signed)
Central Venous Catheter Insertion Procedure Note [name] Fry [id number] 27-Dec-1950  Procedure: Insertion of Central Venous Catheter Indications: Assessment of intravascular volume, Drug and/or fluid administration and Frequent blood sampling  Procedure Details Consent: Risks of procedure as well as the alternatives and risks of each were explained to the (patient/caregiver).  Consent for procedure obtained. Time Out: Verified patient identification, verified procedure, site/side was marked, verified correct patient position, special equipment/implants available, medications/allergies/relevent history reviewed, required imaging and test results available.  Performed  Maximum sterile technique was used including antiseptics, cap, gloves, gown, hand hygiene, mask and sheet. Skin prep: Chlorhexidine; local anesthetic administered A antimicrobial bonded/coated triple lumen catheter was placed in the right internal jugular vein using the Seldinger technique. Ultrasound guidance used.yes Catheter placed to 17 cm. Blood aspirated via all 3 ports and then flushed x 3. Line sutured x 2 and dressing applied.  Evaluation Blood flow good Complications: No apparent complications Patient did tolerate procedure well. Chest X-ray ordered to verify placement.  CXR: pending.  [name] Fry [name] Fry ACNP [facility] PCCM Pager [phone] till 3 pm If no answer page [phone] 05/06/2012, 12:22 PM

## 2012-05-06 NOTE — ED Provider Notes (Signed)
History     CSN: [id number]  Arrival date & time 05/06/12  4098   First MD Initiated Contact with Patient 05/06/12 [phone]      Chief Complaint  Patient presents with  . Shortness of Breath    (Consider location/radiation/quality/duration/timing/severity/associated sxs/prior treatment) HPI Comments: Patient is brought in by EMS for shortness of breath and altered mental status. Per EMS, on arrival she patient is dominant-type apposition and was anxious with hypoxia and increased work of breathing. She had a blood pressure of 70 in the field. EMS tried BiPAP but was unsuccessful. Patient had a decline in mental status during transport and assisted ventilations was started in route. Per her husband she had no trouble breathing yesterday. He states that she woke up this morning and had worsening shortness of breath throughout this morning she is not complaining of any recent cough or fevers. He denies any history of heart problems in the past. She does have a history of diabetes. History is limited by the condition the patient.  Patient is a 61 y.o. female presenting with shortness of breath.  Shortness of Breath     Past Medical History  Diagnosis Date  . Diabetes mellitus   . Hypertension   . Asthma   . COPD (chronic obstructive pulmonary disease)   . Multiple sclerosis     History reviewed. No pertinent past surgical history.  No family history on file.  History  Substance Use Topics  . Smoking status: Never Smoker   . Smokeless tobacco: Not on file  . Alcohol Use: No    OB History    Grav Para Term Preterm Abortions TAB SAB Ect Mult Living                  Review of Systems  Unable to perform ROS: Mental status change  Eyes: Negative.     Allergies  Codeine and Penicillins  Home Medications   Current Outpatient Rx  Name  Route  Sig  Dispense  Refill  . AMLODIPINE BESYLATE 10 MG PO TABS   Oral   Take 10 mg by mouth daily.         . ATORVASTATIN CALCIUM  20 MG PO TABS   Oral   Take 20 mg by mouth daily.         Marland Kitchen FLUTICASONE PROPIONATE 50 MCG/ACT NA SUSP   Nasal   Place 2 sprays into the nose daily.   1 g   0   . FUROSEMIDE 40 MG PO TABS   Oral   Take 40 mg by mouth daily.         Marland Kitchen HYDRALAZINE HCL 25 MG PO TABS   Oral   Take 50 mg by mouth 2 (two) times daily.         . INSULIN GLARGINE 100 UNIT/ML [place] SOLN   Subcutaneous   Inject 60-70 Units into the skin 2 (two) times daily. Inject 70 units in the morning and 60 units at bedtime   10 mL   0   . INTERFERON BETA-1A 30 MCG/0.5ML IM KIT   Intramuscular   Inject 30 mcg into the muscle every 7 (seven) days.         Marland Kitchen LOSARTAN POTASSIUM 100 MG PO TABS   Oral   Take 100 mg by mouth daily.         Marland Kitchen METFORMIN HCL 1000 MG PO TABS   Oral   Take 1,000 mg by mouth 2 (two) times daily  with a meal.         . METOPROLOL SUCCINATE ER 100 MG PO TB24   Oral   Take 1 tablet (100 mg total) by mouth daily. Take with or immediately following a meal.   30 tablet   0   . TRAZODONE HCL 100 MG PO TABS   Oral   Take 100 mg by mouth at bedtime.           BP 143/62  Pulse 131  Temp 97.7 F (36.5 C)  Resp 28  Ht 5\' 1"  (1.549 m)  Wt 242 lb (109.77 kg)  BMI 45.73 kg/m2  SpO2 95%  Physical Exam  Constitutional:       Patient is obese. She has spontaneous eye opening no verbal response. She has some ineffective ventilations.  HENT:  Head: Normocephalic and atraumatic.       Frothy sputum in the oropharynx  Eyes: Conjunctivae normal are normal. Pupils are equal, round, and reactive to light.  Neck: Neck supple.  Cardiovascular: Normal heart sounds.   No murmur heard.      Tachycardic  Pulmonary/Chest:       Patient has diminished respirations and diffuse rales  Abdominal: Soft. She exhibits distension.  Musculoskeletal: Normal range of motion.  Neurological:       Decreased responsiveness but has spontaneous eye opening  Skin: Skin is warm and dry.    ED  Course  INTUBATION Performed by: Gavin Faivre Authorized by: Rolan Bucco Consent: The procedure was performed in an emergent situation. Indications: respiratory distress Intubation method: video-assisted Patient status: paralyzed (RSI) Preoxygenation: BVM Sedatives: etomidate Paralytic: succinylcholine Laryngoscope size: Mac 4 Tube size: 7.5 mm Tube type: cuffed Number of attempts: 1 Cricoid pressure: no Cords visualized: yes Post-procedure assessment: chest rise and CO2 detector Breath sounds: equal and absent over the epigastrium Cuff inflated: yes ETT to lip: 24 cm Tube secured with: ETT holder Chest x-ray interpreted by me and radiologist. Chest x-ray findings: endotracheal tube too low Tube repositioned: tube repositioned successfully Patient tolerance: Patient tolerated the procedure well with no immediate complications. Comments: Intubation was performed by paramedic student under my supervision   (including critical care time)  Results for orders placed during the hospital encounter of 05/06/12  GLUCOSE, CAPILLARY      Component Value Range   Glucose-Capillary 399 (*) 70 - 99 mg/dL  CBC WITH DIFFERENTIAL      Component Value Range   WBC 30.3 (*) 4.0 - 10.5 K/uL   RBC 4.99  3.87 - 5.11 MIL/uL   Hemoglobin 15.7 (*) 12.0 - 15.0 g/dL   HCT 16.1 (*) 09.6 - 04.5 %   MCV 95.6  78.0 - 100.0 fL   MCH 31.5  26.0 - 34.0 pg   MCHC 32.9  30.0 - 36.0 g/dL   RDW 40.9  81.1 - 91.4 %   Platelets 129 (*) 150 - 400 K/uL   Neutrophils Relative 72  43 - 77 %   Neutro Abs 21.9 (*) 1.7 - 7.7 K/uL   Lymphocytes Relative 22  12 - 46 %   Lymphs Abs 6.8 (*) 0.7 - 4.0 K/uL   Monocytes Relative 4  3 - 12 %   Monocytes Absolute 1.2 (*) 0.1 - 1.0 K/uL   Eosinophils Relative 1  0 - 5 %   Eosinophils Absolute 0.4  0.0 - 0.7 K/uL   Basophils Relative 0  0 - 1 %   Basophils Absolute 0.0  0.0 - 0.1 K/uL  Smear Review MORPHOLOGY UNREMARKABLE    COMPREHENSIVE METABOLIC PANEL       Component Value Range   Sodium 139  135 - 145 mEq/L   Potassium 3.7  3.5 - 5.1 mEq/L   Chloride 100  96 - 112 mEq/L   CO2 24  19 - 32 mEq/L   Glucose, Bld 426 (*) 70 - 99 mg/dL   BUN 11  6 - 23 mg/dL   Creatinine, Ser 0.45  0.50 - 1.10 mg/dL   Calcium 8.3 (*) 8.4 - 10.5 mg/dL   Total Protein 7.7  6.0 - 8.3 g/dL   Albumin 3.0 (*) 3.5 - 5.2 g/dL   AST 53 (*) 0 - 37 U/L   ALT 40 (*) 0 - 35 U/L   Alkaline Phosphatase 116  39 - 117 U/L   Total Bilirubin 0.5  0.3 - 1.2 mg/dL   GFR calc non Af Amer 66 (*) >90 mL/min   GFR calc Af Amer 77 (*) >90 mL/min  LACTIC ACID, PLASMA      Component Value Range   Lactic Acid, Venous 4.3 (*) 0.5 - 2.2 mmol/L  URINALYSIS, ROUTINE W REFLEX MICROSCOPIC      Component Value Range   Color, Urine YELLOW  YELLOW   APPearance CLEAR  CLEAR   Specific Gravity, Urine 1.011  1.005 - 1.030   pH 7.0  5.0 - 8.0   Glucose, UA >1000 (*) NEGATIVE mg/dL   Hgb urine dipstick SMALL (*) NEGATIVE   Bilirubin Urine NEGATIVE  NEGATIVE   Ketones, ur NEGATIVE  NEGATIVE mg/dL   Protein, ur 409 (*) NEGATIVE mg/dL   Urobilinogen, UA 0.2  0.0 - 1.0 mg/dL   Nitrite NEGATIVE  NEGATIVE   Leukocytes, UA NEGATIVE  NEGATIVE  POCT I-STAT 3, BLOOD GAS (G3P V)      Component Value Range   pH, Ven 7.111 (*) 7.250 - 7.300   pCO2, Ven 92.1 (*) 45.0 - 50.0 mmHg   pO2, Ven 61.0 (*) 30.0 - 45.0 mmHg   Bicarbonate 29.3 (*) 20.0 - 24.0 mEq/L   TCO2 32  0 - 100 mmol/L   O2 Saturation 79.0     Acid-base deficit 4.0 (*) 0.0 - 2.0 mmol/L   Sample type VENOUS     Comment NOTIFIED PHYSICIAN    POCT I-STAT TROPONIN I      Component Value Range   Troponin i, poc 0.17 (*) 0.00 - 0.08 ng/mL   Comment NOTIFIED PHYSICIAN     Comment 3           URINE MICROSCOPIC-ADD ON      Component Value Range   Squamous Epithelial / LPF RARE  RARE   WBC, UA 0-2  <3 WBC/hpf   RBC / HPF 3-6  <3 RBC/hpf   Casts GRANULAR CAST (*) NEGATIVE  POCT I-STAT 3, BLOOD GAS (G3+)      Component Value Range   pH,  Arterial 7.217 (*) 7.350 - 7.450   pCO2 arterial 69.6 (*) 35.0 - 45.0 mmHg   pO2, Arterial 72.0 (*) 80.0 - 100.0 mmHg   Bicarbonate 28.3 (*) 20.0 - 24.0 mEq/L   TCO2 30  0 - 100 mmol/L   O2 Saturation 90.0     Acid-base deficit 2.0  0.0 - 2.0 mmol/L   Sample type ARTERIAL     Comment NOTIFIED PHYSICIAN     Dg Chest Portable 1 View  05/06/2012  *RADIOLOGY REPORT*  Clinical Data: Short of breath  PORTABLE CHEST -  1 VIEW  Comparison: Prior chest x-ray and CT scan of the chest 10/19/2011  Findings: The patient is intubated.  The carina is difficult to identify, the tip of the endotracheal tube is likely just above the carina.  A nasogastric tube is in place, the tip lies below the diaphragm off the field of view.  Diffuse bilateral interstitial opacities with confluent airspace disease in the bilateral lower lobes.  Cardiomegaly appears slightly progressed compared to the prior study.  No large pleural effusion or pneumothorax identified.  IMPRESSION:  1.  Interval development of diffuse bilateral interstitial opacities with more confluent airspace disease in the bilateral lower lobes.  In the setting of slightly progressed cardiomegaly, findings are most consistent with congestive heart failure and acute  pulmonary edema.  Additional differential considerations include atypical infection, multilobar pneumonia, and in the appropriate clinical setting - aspiration.  2.  Interval intubation.  The tip of the endotracheal tube is low, likely just above the carina.  Recommend withdrawing 2 cm for more optimal placement.   Original Report Authenticated By: Malachy Moan, M.D.     Date: 05/06/2012  Rate: 135  Rhythm: sinus tachycardia  QRS Axis: normal  Intervals: normal  ST/T Wave abnormalities: nonspecific ST/T changes  Conduction Disutrbances:none  Narrative Interpretation:   Old EKG Reviewed: changes noted    No diagnosis found.    MDM  On arrival patient was intubated using RSI. The  intubation was done by the paramedic student under my supervision. She maintained good oxygen saturations throughout she was markedly hypertensive throughout the ED course. Chest x-ray showed diffuse pulmonary edema and the frothy sputum that she's having her symptoms seem consistent with CHF. She has no fever or history suggestive of pneumonia. She was started on nitroglycerin drip and given Lasix. Blood cultures were drawn. She was maintained on the vent. Critical care was notified and came down to admit the patient.        Rolan Bucco, MD 05/06/12 1134

## 2012-05-06 NOTE — ED Notes (Signed)
Received pt from home with c/o shortness of breath. Upon arrival of EMS pt was in tripod position and anxious with a SBP of 70. EMS attempted to to try CPAP but pt was unable to tolerate it. EMS obtained IV 22g in Right FA.

## 2012-05-07 ENCOUNTER — Inpatient Hospital Stay (HOSPITAL_COMMUNITY): Payer: 59

## 2012-05-07 ENCOUNTER — Encounter (HOSPITAL_COMMUNITY)
Admission: EM | Disposition: A | Discharge status: Home / Self Care | Payer: Self-pay | Source: Home / Self Care | Attending: Internal Medicine

## 2012-05-07 DIAGNOSIS — I5033 Acute on chronic diastolic (congestive) heart failure: Secondary | ICD-10-CM

## 2012-05-07 DIAGNOSIS — E119 Type 2 diabetes mellitus without complications: Secondary | ICD-10-CM

## 2012-05-07 DIAGNOSIS — J189 Pneumonia, unspecified organism: Secondary | ICD-10-CM

## 2012-05-07 DIAGNOSIS — E669 Obesity, unspecified: Secondary | ICD-10-CM

## 2012-05-07 DIAGNOSIS — I10 Essential (primary) hypertension: Secondary | ICD-10-CM

## 2012-05-07 HISTORY — DX: Essential (primary) hypertension: I10

## 2012-05-07 HISTORY — PX: LEFT HEART CATHETERIZATION WITH CORONARY ANGIOGRAM: SHX5451

## 2012-05-07 LAB — CK TOTAL AND CKMB (NOT AT ARMC)
CK, MB: 6.1 ng/mL (ref 0.3–4.0)
Relative Index: 3 — ABNORMAL HIGH (ref 0.0–2.5)

## 2012-05-07 LAB — BASIC METABOLIC PANEL
BUN: 20 mg/dL (ref 6–23)
CO2: 26 mEq/L (ref 19–32)
GFR calc non Af Amer: 40 mL/min — ABNORMAL LOW (ref >90)
Glucose, Bld: 199 mg/dL — ABNORMAL HIGH (ref 70–99)
Potassium: 4 mEq/L (ref 3.5–5.1)

## 2012-05-07 LAB — URINE CULTURE: Culture: NO GROWTH

## 2012-05-07 LAB — BLOOD GAS, ARTERIAL
Acid-base deficit: 1.4 mmol/L (ref 0.0–2.0)
Bicarbonate: 25.6 mEq/L — ABNORMAL HIGH (ref 20.0–24.0)
Bicarbonate: 22.6 mEq/L (ref 20.0–24.0)
Drawn by: 362741
FIO2: 0.5 %
O2 Saturation: 99.6 %
O2 Saturation: 99.2 %
PEEP: 5 cmH2O
Patient temperature: 98.6
RATE: 30 resp/min
TCO2: 23.7 mmol/L (ref 0–100)
pCO2 arterial: 36.5 mmHg (ref 35.0–45.0)
pH, Arterial: 7.574 — ABNORMAL HIGH (ref 7.350–7.450)
pO2, Arterial: 133 mmHg — ABNORMAL HIGH (ref 80.0–100.0)

## 2012-05-07 LAB — COMPREHENSIVE METABOLIC PANEL
Albumin: 2.9 g/dL — ABNORMAL LOW (ref 3.5–5.2)
Alkaline Phosphatase: 87 U/L (ref 39–117)
BUN: 23 mg/dL (ref 6–23)
Calcium: 8.3 mg/dL — ABNORMAL LOW (ref 8.4–10.5)
Potassium: 4.4 mEq/L (ref 3.5–5.1)
Sodium: 142 mEq/L (ref 135–145)
Total Protein: 6.9 g/dL (ref 6.0–8.3)

## 2012-05-07 LAB — GLUCOSE, CAPILLARY
Glucose-Capillary: 126 mg/dL — ABNORMAL HIGH (ref 70–99)
Glucose-Capillary: 126 mg/dL — ABNORMAL HIGH (ref 70–99)
Glucose-Capillary: 126 mg/dL — ABNORMAL HIGH (ref 70–99)
Glucose-Capillary: 129 mg/dL — ABNORMAL HIGH (ref 70–99)
Glucose-Capillary: 136 mg/dL — ABNORMAL HIGH (ref 70–99)
Glucose-Capillary: 168 mg/dL — ABNORMAL HIGH (ref 70–99)
Glucose-Capillary: 178 mg/dL — ABNORMAL HIGH (ref 70–99)

## 2012-05-07 LAB — CBC
HCT: 44.6 % (ref 36.0–46.0)
HCT: 41.1 % (ref 36.0–46.0)
Hemoglobin: 14.7 g/dL (ref 12.0–15.0)
Hemoglobin: 13.3 g/dL (ref 12.0–15.0)
MCH: 30.8 pg (ref 26.0–34.0)
MCH: 30.1 pg (ref 26.0–34.0)
MCHC: 33 g/dL (ref 30.0–36.0)
MCHC: 32.4 g/dL (ref 30.0–36.0)
MCV: 93 fL (ref 78.0–100.0)
RBC: 4.77 MIL/uL (ref 3.87–5.11)

## 2012-05-07 LAB — TROPONIN I: Troponin I: 1 ng/mL (ref <0.30)

## 2012-05-07 SURGERY — LEFT HEART CATHETERIZATION WITH CORONARY ANGIOGRAM
Anesthesia: LOCAL

## 2012-05-07 MED ORDER — INSULIN GLARGINE 100 UNIT/ML ~~LOC~~ SOLN
25.0000 [IU] | Freq: Every day | SUBCUTANEOUS | Status: DC
  Administered 2012-05-07 – 2012-05-12 (×7): 25 [IU] via SUBCUTANEOUS

## 2012-05-07 MED ORDER — ONDANSETRON HCL 4 MG/2ML IJ SOLN
4.0000 mg | Freq: Four times a day (QID) | INTRAMUSCULAR | Status: DC | PRN
  Administered 2012-05-10 – 2012-05-15 (×4): 4 mg via INTRAVENOUS
  Filled 2012-05-07 (×5): qty 2

## 2012-05-07 MED ORDER — SODIUM CHLORIDE 0.9 % IJ SOLN: 3.0000 mL | Freq: Two times a day (BID) | INTRAMUSCULAR | Status: DC

## 2012-05-07 MED ORDER — HEPARIN (PORCINE) IN NACL 2-0.9 UNIT/ML-% IJ SOLN
INTRAMUSCULAR | Status: AC
  Filled 2012-05-07: qty 1000

## 2012-05-07 MED ORDER — MIDAZOLAM HCL 2 MG/2ML IJ SOLN
INTRAMUSCULAR | Status: AC
  Filled 2012-05-07: qty 2

## 2012-05-07 MED ORDER — HYDRALAZINE HCL 20 MG/ML IJ SOLN
10.0000 mg | Freq: Four times a day (QID) | INTRAMUSCULAR | Status: DC
  Administered 2012-05-07: 10 mg via INTRAVENOUS

## 2012-05-07 MED ORDER — PRO-STAT SUGAR FREE PO LIQD: 30.0000 mL | Freq: Every day | ORAL | Status: DC

## 2012-05-07 MED ORDER — HEPARIN SODIUM (PORCINE) 1000 UNIT/ML IJ SOLN
INTRAMUSCULAR | Status: AC
  Filled 2012-05-07: qty 1

## 2012-05-07 MED ORDER — HEPARIN SODIUM (PORCINE) 5000 UNIT/ML IJ SOLN
5000.0000 [IU] | Freq: Three times a day (TID) | INTRAMUSCULAR | Status: DC
  Administered 2012-05-07 – 2012-05-13 (×17): 5000 [IU] via SUBCUTANEOUS
  Filled 2012-05-07 (×20): qty 1

## 2012-05-07 MED ORDER — SODIUM CHLORIDE 0.9 % IV SOLN: 250.0000 mL | INTRAVENOUS | Status: DC | PRN

## 2012-05-07 MED ORDER — INSULIN ASPART 100 UNIT/ML ~~LOC~~ SOLN
2.0000 [IU] | SUBCUTANEOUS | Status: DC
  Administered 2012-05-07: 4 [IU] via SUBCUTANEOUS
  Administered 2012-05-07: 2 [IU] via SUBCUTANEOUS
  Administered 2012-05-07: 4 [IU] via SUBCUTANEOUS
  Administered 2012-05-07: 2 [IU] via SUBCUTANEOUS
  Administered 2012-05-07: 4 [IU] via SUBCUTANEOUS
  Administered 2012-05-08 (×2): 2 [IU] via SUBCUTANEOUS
  Administered 2012-05-08: 6 [IU] via SUBCUTANEOUS
  Administered 2012-05-08: 2 [IU] via SUBCUTANEOUS

## 2012-05-07 MED ORDER — LIDOCAINE HCL (PF) 1 % IJ SOLN
INTRAMUSCULAR | Status: AC
  Filled 2012-05-07: qty 30

## 2012-05-07 MED ORDER — HEPARIN SODIUM (PORCINE) 5000 UNIT/ML IJ SOLN
5000.0000 [IU] | Freq: Three times a day (TID) | INTRAMUSCULAR | Status: DC
  Administered 2012-05-07: 5000 [IU] via SUBCUTANEOUS
  Filled 2012-05-07 (×3): qty 1

## 2012-05-07 MED ORDER — PROMOTE PO LIQD: 1000.0000 mL | ORAL | Status: DC

## 2012-05-07 MED ORDER — DIAZEPAM 5 MG/ML IJ SOLN
5.0000 mg | Freq: Three times a day (TID) | INTRAMUSCULAR | Status: DC | PRN
  Administered 2012-05-07: 5 mg via INTRAVENOUS
  Filled 2012-05-07: qty 2

## 2012-05-07 MED ORDER — VERAPAMIL HCL 2.5 MG/ML IV SOLN
INTRAVENOUS | Status: AC
  Filled 2012-05-07: qty 2

## 2012-05-07 MED ORDER — NITROGLYCERIN 0.2 MG/ML ON CALL CATH LAB
INTRAVENOUS | Status: AC
  Filled 2012-05-07: qty 1

## 2012-05-07 MED ORDER — ASPIRIN 81 MG PO CHEW
324.0000 mg | CHEWABLE_TABLET | ORAL | Status: AC
  Administered 2012-05-07: 324 mg via NASOGASTRIC
  Filled 2012-05-07: qty 4

## 2012-05-07 MED ORDER — FENTANYL CITRATE 0.05 MG/ML IJ SOLN
INTRAMUSCULAR | Status: AC
  Filled 2012-05-07: qty 2

## 2012-05-07 MED ORDER — SODIUM CHLORIDE 0.9 % IJ SOLN: 3.0000 mL | INTRAMUSCULAR | Status: DC | PRN

## 2012-05-07 MED ORDER — HYDRALAZINE HCL 20 MG/ML IJ SOLN
10.0000 mg | INTRAMUSCULAR | Status: DC
Start: 2012-05-07 — End: 2012-05-09
  Administered 2012-05-07 – 2012-05-09 (×10): 10 mg via INTRAVENOUS
  Filled 2012-05-07 (×4): qty 0.5
  Filled 2012-05-07: qty 1
  Filled 2012-05-07 (×8): qty 0.5

## 2012-05-07 MED ORDER — DEXTROSE 10 % IV SOLN: INTRAVENOUS | Status: DC | PRN

## 2012-05-07 MED ORDER — HYDRALAZINE HCL 20 MG/ML IJ SOLN
10.0000 mg | INTRAMUSCULAR | Status: DC | PRN
  Administered 2012-05-08: 10 mg via INTRAVENOUS
  Filled 2012-05-07 (×5): qty 1
  Filled 2012-05-07: qty 0.5

## 2012-05-07 MED ORDER — ADULT MULTIVITAMIN LIQUID CH: 5.0000 mL | Freq: Every day | ORAL | Status: DC

## 2012-05-07 MED ORDER — DIAZEPAM 5 MG PO TABS: 5.0000 mg | ORAL_TABLET | ORAL | Status: DC

## 2012-05-07 NOTE — Progress Notes (Signed)
Inpatient Diabetes Program Recommendations  AACE/ADA: New Consensus Statement on Inpatient Glycemic Control (2013)  Target Ranges:  Prepandial:   less than 140 mg/dL      Peak postprandial:   less than 180 mg/dL (1-2 hours)      Critically ill patients:  140 - 180 mg/dL   Reason for Visit: Is on Adult ICU Glycemic Control Order Set   Inpatient Diabetes Program Recommendations HgbA1C: No Hgb A1C found.  Request MD order for Hgb A1C.  Note: Thank you. Hayes Rehfeldt S. Elsie Lincoln, RN, CNS, CDE Inpatient Diabetes Program, team pager [phone] or [phone]

## 2012-05-07 NOTE — Progress Notes (Signed)
eLink Physician-Brief Progress Note Patient Name: [name] Fry DOB: Jul 05, 1950 MRN: [id number]  Date of Service  05/07/2012   HPI/Events of Note   PH of 7.57 on ABG with low pCO2  eICU Interventions  Plan: Decrease vent rate from 30 to 20 Recheck ABG in 2 hours post vent change   Intervention Category Intermediate Interventions: Other: (acid/base)  DETERDING,ELIZABETH 05/07/2012, 3:36 AM

## 2012-05-07 NOTE — Progress Notes (Signed)
  Echocardiogram 2D Echocardiogram has been performed.  [name] Fry 05/07/2012, 1:16 PM

## 2012-05-07 NOTE — Progress Notes (Addendum)
INITIAL ADULT NUTRITION ASSESSMENT Date: 05/07/2012   Time: 9:12 AM Reason for Assessment: Consult   INTERVENTION: 1. Recommend: Initiate Promote @ 20 ml/hr via OG and increase by 10 ml every 4 hours to goal rate of 30 ml/hr. 30 ml Prostat 5 times daily.  At goal rate, tube feeding regimen will provide 1220 kcal, 120 grams of protein, and 605 ml of H2O.  May require additional free water if no IVF.   2.  Multivitamin per tube daily.   3. RD will continue to follow    DOCUMENTATION CODES Per approved criteria  -Morbid Obesity    ASSESSMENT: Female 61 y.o.  Dx: Respiratory failure  Hx:  Past Medical History  Diagnosis Date  . Diabetes mellitus   . Hypertension   . Asthma   . COPD (chronic obstructive pulmonary disease)   . Multiple sclerosis   . Goiter     Past Surgical History  Procedure Date  . Abdominal hysterectomy Nov 2005     Related Meds:     . antiseptic oral rinse  15 mL Mouth Rinse QID  . chlorhexidine  15 mL Mouth Rinse BID  . [EXPIRED] etomidate      . [COMPLETED] fentaNYL  100 mcg Intravenous Once  . [COMPLETED] fentaNYL  100 mcg Intravenous Once  . fentaNYL  100 mcg Intravenous Once  . [COMPLETED] furosemide  40 mg Intravenous Once  . furosemide  80 mg Intravenous Q8H  . hydrALAZINE  10 mg Intravenous Q6H  . influenza  inactive virus vaccine  0.5 mL Intramuscular Tomorrow-1000  . insulin aspart  2-6 Units Subcutaneous Q4H  . insulin glargine  25 Units Subcutaneous QHS  . levalbuterol  0.63 mg Nebulization Q6H  . [EXPIRED] lidocaine (cardiac) 100 mg/64ml      . [COMPLETED] magnesium sulfate 1 - 4 g bolus IVPB  1 g Intravenous Once  . metoprolol tartrate  50 mg Oral BID  . [COMPLETED] midazolam  2 mg Intravenous Once  . [COMPLETED] nitroGLYCERIN  20 mcg/min Intravenous Once  . pantoprazole (PROTONIX) IV  40 mg Intravenous QHS  . pneumococcal 23 valent vaccine  0.5 mL Intramuscular Tomorrow-1000  . potassium chloride  20 mEq Per Tube TID  .  [EXPIRED] rocuronium      . [EXPIRED] succinylcholine      . [COMPLETED] vecuronium  10 mg Intravenous Once  . [DISCONTINUED] furosemide  80 mg Intravenous Q8H  . [DISCONTINUED] magnesium sulfate LVP 250-500 ml  1 g Intravenous Once  . [DISCONTINUED] metoprolol tartrate  50 mg Oral BID     Ht: 5\' 1"  (154.9 cm)  Wt: 235 lb 0.2 oz (106.6 kg)  Ideal Wt: 48 kg  % Ideal Wt: 222%  Usual Wt:  Wt Readings from Last 5 Encounters:  05/07/12 235 lb 0.2 oz (106.6 kg)  10/22/11 242 lb 9.6 oz (110.043 kg)    % Usual Wt: 97%  Body mass index is 44.40 kg/(m^2). Morbid Obesity   Food/Nutrition Related Hx: no recent weight loss or poor po intake per MST (Malnutrition Screening Tool) on admission.   Labs:  CMP     Component Value Date/Time   NA 138 05/07/2012 0400   K 4.0 05/07/2012 0400   CL 100 05/07/2012 0400   CO2 26 05/07/2012 0400   GLUCOSE 199* 05/07/2012 0400   BUN 20 05/07/2012 0400   CREATININE 1.41* 05/07/2012 0400   CALCIUM 8.6 05/07/2012 0400   PROT 7.7 05/06/2012 0911   ALBUMIN 3.0* 05/06/2012 0911  AST 53* 05/06/2012 0911   ALT 40* 05/06/2012 0911   ALKPHOS 116 05/06/2012 0911   BILITOT 0.5 05/06/2012 0911   GFRNONAA 40* 05/07/2012 0400   GFRAA 46* 05/07/2012 0400     CBG (last 3)   Basename 05/07/12 0406 05/07/12 0258 05/07/12 0201  GLUCAP 178* 129* 126*     No results found for this basename: HGBA1C      Intake/Output Summary (Last 24 hours) at 05/07/12 0920 Last data filed at 05/07/12 0800  Gross per 24 hour  Intake 882.03 ml  Output   3315 ml  Net -2432.97 ml     Diet Order: NPO  Supplements/Tube Feeding: none   IVF:    sodium chloride Last Rate: Stopped (05/07/12 0300)  sodium chloride Last Rate: 20 mL/hr at 05/06/12 1900  dextrose   nitroGLYCERIN Last Rate: 16.667 mcg/min (05/07/12 0840)  propofol Last Rate: Stopped (05/06/12 1624)  [DISCONTINUED] insulin (NOVOLIN-R) infusion Last Rate: Stopped (05/07/12 0300)  [DISCONTINUED] nitroGLYCERIN Last  Rate: 50 mcg/min (05/06/12 1133)  [DISCONTINUED] propofol Last Rate: 80 mcg/kg/min (05/06/12 1133)   Patient is currently intubated on ventilator support.  MV: 8.1  Temp:Temp (24hrs), Avg:99.4 F (37.4 C), Min:97.6 F (36.4 C), Max:100.8 F (38.2 C)  Propofol: d/c'd    Estimated Nutritional Needs:   Kcal: 4098-1191 Protein: 117-130 gm  Fluid:  1.2-1.4 L   Pt with increased SOB and increased secreations. Pt was emergently intubated in ED. Remains intubated at this time. RD consulted for initiation and management of EN.  Pt with hx of MS.  Current BMI meets criteria for permissive underfeeding per ASPEN guidelines.   NUTRITION DIAGNOSIS: Inadequate oral intake related to inability to eat as evidenced by NPO.   MONITORING/EVALUATION(Goals): Goal: Enteral nutrition to provide 60-70% of estimated calorie needs (22-25 kcals/kg ideal body weight) and >/= 90% of estimated protein needs, based on ASPEN guidelines for permissive underfeeding in critically ill obese individuals.   Monitor: vent status, EN initiation/tolerance, weight, labs  EDUCATION NEEDS: -No education needs identified at this time   Clarene Duke RD, LDN Pager [phone] After Hours pager [phone]  05/07/2012, 9:12 AM

## 2012-05-07 NOTE — Progress Notes (Signed)
I have seen and evaluated the patient this morning along with Nada Boozer, NP. I agree with her findings, examination as well as impression recommendations.  She looks much improved as compared to yesterday -- awake & alert.  The I/Os do not accurately account for ER UOP.  She does ponit to her chest indicating discomfort. Unfortunately, the NTG gtt never got re-started upon arrival to CCU & her BP continues to be in the 180-245mmHg range.   She did have a brief hypotensive period that most likely led to d/c of the gtt.   Restart NTG gtt; standing IV Hydralazine until taking PO  Continue diuresis today -- can probably back off by this PM  On BB - HR much improved. Troponin did increase to 1.4 & trending down; ECG is very unusual with deep Inferior & Anterior TWI, prolonged QT that would suggest possibility of ischemia.    Would like to see BP better controlled & extubated, but may need LHC to confirm/deny presence of new obstructive CAD since 2008 in a morbidly obese diabetic with HTN, HLD.  Would prefer to withhold anticoagulation until BP has improved to avoid risk of hemorrhagic CVA.  Echo ordered -- will review when done.  Cr increased some -- likely related to Hypertensive crisis.  Would need to ensure that this has stabilized prior to considering Cardiac Cath.  Marykay Lex, M.D., M.S. [facility] [address] [place], [place]  [address]  [phone] Pager # [phone] 05/07/2012 8:59 AM

## 2012-05-07 NOTE — CV Procedure (Signed)
[facility] CARDIAC CATHETERIZATION INTERVENTION REPORT  NAME:  [name] Fry   MRN: [id number] DOB:  03/23/1951   ADMIT DATE: 05/06/2012 Procedure Date: 05/07/2012   INTERVENTIONAL CARDIOLOGIST: Marykay Lex, M.D., MS PRIMARY CARE PROVIDER: LITTLE, Thereasa Solo, MD PRIMARY CARDIOLOGIST: [facility]  PATIENT:  [name] Fry is a 61 y.o. female with a long-standing history of severe / poorly controlled HTN, morbid obesity, DM & HLD (normal coronary arteries by cath in 2008) who presented on 05/06/12 with sudden onset reset respiratory failure with pink frothy sputum.  Upon arrival to ER - BP were ~250/160 & noted to be in frank pulmonary edema.  Intubated overnight for airway management. IV NTG o/n.   This AM clinically improved, but pt c/o SSCP.  ECG with new deep (Wellen's) TWI in Anterior & Anterolateral Leads.  Also Troponin increased to ~1.4, and trending down.   Initial plan was to consider cardiac catheterization for risk stratification once renal function stabilized & extubated with BP stable.  However, she continued to had CP, worse during weaning trials from ventilator.  We were asked by PCCM service to consider LHC today in order to avoid Urgent cath overnight.  PRE-OPERATIVE DIAGNOSIS:    Acute Hypoxic Respiratory Failure   Acute on Chronic Diastolic HF  Acute Pulmonary Edema  ECG & Troponin consistent with NSTEMI  PROCEDURES PERFORMED:    Left Heart Catheterization with Native Coronary Angiography  PROCEDURE: Consent:  Risks of procedure as well as the alternatives and risks of each were explained to the (patient/caregiver).  Consent for procedure obtained. Consent for signed by MD and patient with RN witness -- placed on chart.  PROCEDURE: The patient was brought to the 2nd Floor [facility] in the fasting state and prepped and draped in the usual sterile fashion for Left radial access  after a modified Allen's test with Plethysmography demonstrated excellent Ulnar artery flow. Sterile technique was used including antiseptics, cap, gloves, gown, hand hygiene, mask and sheet.  Skin prep: Chlorhexidine.  Time Out: Verified patient identification, verified procedure, site/side was marked, verified correct patient position, special equipment/implants available, medications/allergies/relevent history reviewed, required imaging and test results available.  Performed  Access: Left Radial Artery; 5 Fr Glide Sheath - Seldinger's technique using Angiocath Micropuncture Kit. Diagnostic:  TIG 4.0 catheter advanced over Versicore wire.  Left & Right Coronary Artery Angiography: TIG 4.0  LV Hemodynamics: TIG 4.0  Hemodynamics:  Central Aortic / Mean Pressures: 110/60 mmHg; 80 mmHg  Left Ventricular Pressures / EDP: 113/11 mmHg; 13 mmHg   Left Ventriculography: Not performed.  Coronary Anatomy:  Left Main: Large caliber vessel, mid ~10-20% plaque; trifurcates into LAD, Circumflex & Ramus Intermedius LAD: Large caliber vessel that wraps the apex perfusing the distal 1/3 of the inferoapical wall; Mild ~20 % focal lesions proximally, otherwise minimal luminal irregularities.  D1: small to moderate caliber in the proximal mid vessel; minimal luminal irregularities  D2: Moderate caliber vessel from mid LAD, minimal luminal irregularities Left Circumflex: Large caliber, co-dominant in distribution; takes acute 70% takeoff from LM followed by a hairpin turn into the AV Groove; only 1 small OM branch before the AV groove where the vessel gives of 2 Inferolateral OM branches before terminating as a small caliber LPL branch; minimal luminal irregularities. Ramus intermedius: Moderate to large caliber vessel that courses as a proximal / high OM, minimal luminal irregularities.  RCA: small caliber, dominant vessel,bifurcates distally into rPDA & trivial  rPL system; minimal luminal  irregularities.  TR Band:  1630 Hours, 16 mL air; non-occlusive hemostasis  ANESTHESIA:   Local Lidocaine 2 ml SEDATION:  2 mg IV Versed, 50 mcg IV fentanyl ; Premedication: 5 mg IV Valium MEDICATIONS: Radial Cocktail: 5 mg Verapamil, 400 mcg NTG, 2 ml 2% Lidocaine in 10 ml NS  Omnipaque Contrast: 65 ml  Anticoagulation: 5500 Units IV Heparin  Anti-Platelet Agent: 324 mg ASA  EBL:   < 5 ml  PATIENT DISPOSITION:    The patient was transferred to the PACU holding area in a hemodynamicaly stable, chest pain free condition.  The patient tolerated the procedure well, and there were no complications.  The patient was stable before, during, and after the procedure.  POST-OPERATIVE DIAGNOSIS:    Essentially Angiographically normal coronary arteries with only mild proximal LAD disease.  No culprit lesion for NSTEMI identified -- confirming Type II MI due to Accelerated HTN in setting of Severe LVH leading to endocardial ischemia.  Despite LVEDP estimated in low range on Echo, on Cath EDP ~45mmHg after radial cocktail & sedation brought systemic pressures to ~106 mmHg.  PLAN OF CARE:  Return to the CCU for continued diuresis & BP control    Anticipate extubation once sedation wears off.   Marykay Lex, M.D., M.S. [facility] [address] [place], [place]  [address]  [phone]  05/07/2012 4:41 PM

## 2012-05-07 NOTE — Progress Notes (Signed)
eLink Physician-Brief Progress Note Patient Name: NEKEYA BRISKI DOB: 21-Oct-1950 MRN: [id number]  Date of Service  05/07/2012   HPI/Events of Note  Currently in range for blood sugar on part 2 of ICU hyperglycemia protocol   eICU Interventions  Plan: Transition to Part 3 - 25 units of lantus q24 hours SSI q4 hours Currently not on TFs   Intervention Category Intermediate Interventions: Hyperglycemia - evaluation and treatment  Medha Pippen 05/07/2012, 1:46 AM

## 2012-05-07 NOTE — Progress Notes (Signed)
Subjective: BP climbs with pain, improved after fentanyl--no chest pain  Objective: Vital signs in last 24 hours: Temp:  [97.6 F (36.4 C)-100.8 F (38.2 C)] 99.7 F (37.6 C) (12/02 0700) Pulse Rate:  [58-143] 85  (12/02 0700) Resp:  [12-37] 21  (12/02 0700) BP: (92-269)/(46-202) 163/72 mmHg (12/02 0745) SpO2:  [78 %-100 %] 100 % (12/02 0738) Arterial Line BP: (85-224)/(53-102) 173/75 mmHg (12/02 0700) FiO2 (%):  [40 %-100 %] 40 % (12/02 0745) Weight:  [106.6 kg (235 lb 0.2 oz)-109.77 kg (242 lb)] 106.6 kg (235 lb 0.2 oz) (12/02 0400) Weight change:  Last BM Date: 05/05/12 Intake/Output from previous day: -1531  Wt 106.6 down from 109.77 12/01 0701 - 12/02 0700 In: 854 [I.V.:666; NG/GT:80; IV Piggyback:108] Out: 2365 [Urine:2365] Intake/Output this shift:    RU:EAVWUJW:JXBJY and oriented X 3, MAE, follows commands, complains of throat pain Heart:S1S2 RRR Lungs:clear ant. No wheezes Abd:+ BS, soft, non tender Ext:no edema 2+ pedal pulses   Lab Results:  Basename 05/07/12 0400 05/06/12 0911  WBC 18.0* 30.3*  HGB 14.7 15.7*  HCT 44.6 47.7*  PLT 111* 129*   BMET  Basename 05/07/12 0400 05/06/12 0911  NA 138 139  K 4.0 3.7  CL 100 100  CO2 26 24  GLUCOSE 199* 426*  BUN 20 11  CREATININE 1.41* 0.92  CALCIUM 8.6 8.3*    Basename 05/06/12 2220 05/06/12 1545  TROPONINI 1.20* 1.41*      Hepatic Function Panel  Basename 05/06/12 0911  PROT 7.7  ALBUMIN 3.0*  AST 53*  ALT 40*  ALKPHOS 116  BILITOT 0.5  BILIDIR --  IBILI --     Studies/Results: Portable Chest Xray In Am  05/07/2012  *RADIOLOGY REPORT*  Clinical Data: Check endotracheal tube position.  PORTABLE CHEST - 1 VIEW  Comparison: 05/06/2012 and CT chest 10/19/2011.  Findings: Endotracheal tube is in satisfactory position. Nasogastric tube is followed into the stomach.  Right IJ central line tip projects over the SVC.  Heart size stable.  Moderate diffuse bilateral air space disease, bibasilar  dependent, with slight interval improved aeration bilaterally.  No definite pleural fluid.  IMPRESSION: Slight interval improvement in diffuse bilateral air space disease, which may be due to edema.   Original Report Authenticated By: Leanna Battles, M.D.    Dg Chest Portable 1 View  05/06/2012  *RADIOLOGY REPORT*  Clinical Data: Central venous catheter placement bedside.  PORTABLE CHEST - 1 VIEW 05/06/2012 1118 hours:  Comparison: Portable chest x-ray earlier same day 0919 hours.  Findings: Right jugular central venous catheter tip projects over the SVC (patient rotated to the left).  Cardiomegaly and dense airspace consolidation throughout both lungs, unchanged.  No evidence of pneumothorax mediastinal hematoma.  Endotracheal tube tip remains in satisfactory position approximately 4 cm above the carina.  Nasogastric tube courses below the diaphragm.  IMPRESSION:  1.  Right jugular central venous catheter tip projects over the SVC.  No acute complicating features. 2.  Remaining support apparatus satisfactory. 3.  Stable severe bilateral pneumonia/edema/ARDS.   Original Report Authenticated By: Hulan Saas, M.D.    Dg Chest Portable 1 View  05/06/2012  *RADIOLOGY REPORT*  Clinical Data: Short of breath  PORTABLE CHEST - 1 VIEW  Comparison: Prior chest x-ray and CT scan of the chest 10/19/2011  Findings: The patient is intubated.  The carina is difficult to identify, the tip of the endotracheal tube is likely just above the carina.  A nasogastric tube is in place, the tip lies  below the diaphragm off the field of view.  Diffuse bilateral interstitial opacities with confluent airspace disease in the bilateral lower lobes.  Cardiomegaly appears slightly progressed compared to the prior study.  No large pleural effusion or pneumothorax identified.  IMPRESSION:  1.  Interval development of diffuse bilateral interstitial opacities with more confluent airspace disease in the bilateral lower lobes.  In the setting  of slightly progressed cardiomegaly, findings are most consistent with congestive heart failure and acute  pulmonary edema.  Additional differential considerations include atypical infection, multilobar pneumonia, and in the appropriate clinical setting - aspiration.  2.  Interval intubation.  The tip of the endotracheal tube is low, likely just above the carina.  Recommend withdrawing 2 cm for more optimal placement.   Original Report Authenticated By: Malachy Moan, M.D.     Medications: I have reviewed the patient's current medications.    Marland Kitchen antiseptic oral rinse  15 mL Mouth Rinse QID  . chlorhexidine  15 mL Mouth Rinse BID  . [EXPIRED] etomidate      . [COMPLETED] etomidate  20 mg Intravenous Once  . [COMPLETED] fentaNYL  100 mcg Intravenous Once  . [COMPLETED] fentaNYL  100 mcg Intravenous Once  . fentaNYL  100 mcg Intravenous Once  . [COMPLETED] furosemide  40 mg Intravenous Once  . furosemide  80 mg Intravenous Q8H  . influenza  inactive virus vaccine  0.5 mL Intramuscular Tomorrow-1000  . insulin aspart  2-6 Units Subcutaneous Q4H  . insulin glargine  25 Units Subcutaneous QHS  . levalbuterol  0.63 mg Nebulization Q6H  . [EXPIRED] lidocaine (cardiac) 100 mg/34ml      . [COMPLETED] magnesium sulfate 1 - 4 g bolus IVPB  1 g Intravenous Once  . metoprolol tartrate  50 mg Oral BID  . [COMPLETED] midazolam  2 mg Intravenous Once  . [COMPLETED] nitroGLYCERIN  20 mcg/min Intravenous Once  . pantoprazole (PROTONIX) IV  40 mg Intravenous QHS  . pneumococcal 23 valent vaccine  0.5 mL Intramuscular Tomorrow-1000  . potassium chloride  20 mEq Per Tube TID  . [EXPIRED] rocuronium      . [EXPIRED] succinylcholine      . [COMPLETED] succinylcholine  80 mg Intravenous Once  . [COMPLETED] vecuronium  10 mg Intravenous Once  . [DISCONTINUED] furosemide  80 mg Intravenous Q8H  . [DISCONTINUED] magnesium sulfate LVP 250-500 ml  1 g Intravenous Once  . [DISCONTINUED] metoprolol tartrate  50 mg  Oral BID   Assessment/Plan: Principal Problem:  *Respiratory failure Active Problems:  Uncontrolled hypertension  Diabetes mellitus type 2 in obese  Hyperlipidemia  Multiple sclerosis  Acute pulmonary edema  Morbid obesity  Normal coronary arteries by cath 2008; Normal EF; moderate LVH on Echo  Acute on chronic diastolic HF (heart failure)  HTN (hypertension), malignant  PLAN: BP elevation with pain.  Though over all still elevated.  ? Add hydralazine  Plts lower, WBC improving.  Troponin 1.4 to 1.20 will recheck.  Cr. Climbing.  Re check EKG  LOS: 1 day   INGOLD,LAURA R 05/07/2012, 8:17 AM

## 2012-05-07 NOTE — Progress Notes (Signed)
NAME:  [name] Fry   MRN: [id number] DOB:  09-Feb-1951   ADMIT DATE: 05/06/2012   After discussion with Pulm Medicine re: plans to extubate, it appears that she is still complaining of substernal discomfort. Her BP is much improved & would otherwise be ready for extubation.   There is concern that with her grossly abnormal ECG & + Troponins that she may indeed have a significant CAD lesion & extubation may lead to excess stress on the heart.  I have discussed this with the patient and her husband.  We have decided to proceed with LHC (attempt via L Radial A Access) to delineate her coronary anatomy.    Performing MD:  Marykay Lex, M.D., M.S.  Procedure:  Left Heart Catheterization with Native Coronary Angiography & Possible Percutaneous Coronary Intervention  The procedure with Risks/Benefits/Alternatives and Indications was reviewed with the patient and husband.  All questions were answered.    Risks / Complications include, but not limited to: Death, MI, CVA/TIA, VF/VT (with defibrillation), Bradycardia (need for temporary pacer placement), contrast induced nephropathy (somewhat concerning with Cr increased after diuresis), bleeding / bruising / hematoma / pseudoaneurysm, vascular or coronary injury (with possible emergent CT or Vascular Surgery), adverse medication reactions, infection.    The patient (and family) voice understanding and agree to proceed.   I have signed the consent form and placed it on the chart for patient signature and RN witness.     Marykay Lex, M.D., M.S. [facility] [address] [place], [place]  [address]  [phone]  05/07/2012 2:55 PM

## 2012-05-07 NOTE — Care Management Note (Signed)
    Page 1 of 1   05/07/2012     2:36:44 PM   CARE MANAGEMENT NOTE 05/07/2012  Patient:  [name] Fry, [name] Fry   Account Number:  [id number]  Date Initiated:  05/07/2012  Documentation initiated by:  Junius Creamer  Subjective/Objective Assessment:   adm w resp distress, htn     Action/Plan:   lives w husband, pcp dr al little   Anticipated DC Date:     Anticipated DC Plan:        DC Planning Services  CM consult      Choice offered to / List presented to:             Status of service:   Medicare Important Message given?   (If response is "NO", the following Medicare IM given date fields will be blank) Date Medicare IM given:   Date Additional Medicare IM given:    Discharge Disposition:    Per UR Regulation:  Reviewed for med. necessity/level of care/duration of stay  If discussed at Long Length of Stay Meetings, dates discussed:    Comments:  12/2 14:36p debbie Jeshua Ransford rn,bsn [phone]

## 2012-05-07 NOTE — H&P (Signed)
PULMONARY  / CRITICAL CARE MEDICINE  Name: [name] Fry MRN: [id number] DOB: Nov 15, 1950    LOS: 1  REFERRING MD : EDP  CHIEF COMPLAINT:  Acute resp failure  BRIEF PATIENT DESCRIPTION:  61 yo MO AAF with multiple medical problems including MS/HTN/, smoker here with acute resp failure, secondary to pulm edema, HTN.  LINES / TUBES: 12-1 ott>> 12-1 CVL>> 12-1 a line  CULTURES: 12-1 sputum>> 12-1 uc>> 12-1 bcx2>>   Lab 05/06/12 1033  PROCALCITON <0.10     ANTIBIOTICS: None Anti-infectives    None       SIGNIFICANT EVENTS:  12-1 intubated 12-2 neg balance, clearing pcxr  LEVEL OF CARE:  ICU PRIMARY SERVICE:  PCCM CONSULTANTS:  SVC CODE STATUS: full DIET:  npo DVT Px:  pas GI Px:  PPI  INTERVAL HISTORY:   VITAL SIGNS: Temp:  [97.6 F (36.4 C)-100.8 F (38.2 C)] 99.7 F (37.6 C) (12/02 1100) Pulse Rate:  [58-95] 73  (12/02 1100) Resp:  [17-33] 20  (12/02 1100) BP: (92-187)/(46-87) 162/62 mmHg (12/02 1000) SpO2:  [94 %-100 %] 100 % (12/02 1100) Arterial Line BP: (85-194)/(53-102) 130/66 mmHg (12/02 1100) FiO2 (%):  [40 %-100 %] 40 % (12/02 1122) Weight:  [106.6 kg (235 lb 0.2 oz)] 106.6 kg (235 lb 0.2 oz) (12/02 0400) HEMODYNAMICS: CVP:  [8 mmHg-22 mmHg] 11 mmHg VENTILATOR SETTINGS: Vent Mode:  [-] PSV;CPAP FiO2 (%):  [40 %-100 %] 40 % Set Rate:  [20 bmp-30 bmp] 20 bmp Vt Set:  [380 mL] 380 mL PEEP:  [5 cmH20] 5 cmH20 Pressure Support:  [5 cmH20] 5 cmH20 Plateau Pressure:  [15 cmH20-22 cmH20] 16 cmH20 INTAKE / OUTPUT: Intake/Output      12/01 0701 - 12/02 0700 12/02 0701 - 12/03 0700   I.V. (mL/kg) 666 (6.2) 122 (1.1)   NG/GT 80 80   IV Piggyback 116    Total Intake(mL/kg) 862 (8.1) 202 (1.9)   Urine (mL/kg/hr) 3065 (1.2) 850 (1.3)   Total Output 3065 850   Net -2203 -648          PHYSICAL EXAMINATION: General:  MOAAF intubated rass 0, cam neg Neuro:  Sedated HEENT:  jvd reduced Cardiovascular:  hsr rrr Lungs: coarse  improved Abdomen:  Obese Musculoskeletal:  intact Skin:  warm   LABS: Cbc  Lab 05/07/12 0400 05/06/12 0911  WBC 18.0* --  HGB 14.7 15.7*  HCT 44.6 47.7*  PLT 111* 129*    Chemistry   Lab 05/07/12 0400 05/06/12 1032 05/06/12 0911  NA 138 -- 139  K 4.0 -- 3.7  CL 100 -- 100  CO2 26 -- 24  BUN 20 -- 11  CREATININE 1.41* -- 0.92  CALCIUM 8.6 -- 8.3*  MG -- 1.9 --  PHOS -- 5.2* --  GLUCOSE 199* -- 426*    Liver fxn  Lab 05/06/12 0911  AST 53*  ALT 40*  ALKPHOS 116  BILITOT 0.5  PROT 7.7  ALBUMIN 3.0*   coags No results found for this basename: APTT:3,INR:3 in the last 168 hours Sepsis markers  Lab 05/06/12 1033 05/06/12 0932  LATICACIDVEN -- 4.3*  PROCALCITON <0.10 --   Cardiac markers  Lab 05/07/12 0900 05/06/12 2220 05/06/12 1545  CKTOTAL 203* -- --  CKMB 6.1* -- --  TROPONINI 1.00* 1.20* 1.41*   BNP No results found for this basename: PROBNP:3 in the last 168 hours ABG  Lab 05/07/12 0530 05/07/12 0314 05/06/12 1434  PHART 7.408 7.574* 7.357  PCO2ART 36.5 27.6* 46.1*  PO2ART 133.0* 203.0* 195.0*  HCO3 22.6 25.6* 25.8*  TCO2 23.7 26.4 27    CBG trend  Lab 05/07/12 1139 05/07/12 0821 05/07/12 0406 05/07/12 0258 05/07/12 0201  GLUCAP 126* 158* 178* 129* 126*    IMAGING:  Portable Chest Xray In Am  05/07/2012  *RADIOLOGY REPORT*  Clinical Data: Check endotracheal tube position.  PORTABLE CHEST - 1 VIEW  Comparison: 05/06/2012 and CT chest 10/19/2011.  Findings: Endotracheal tube is in satisfactory position. Nasogastric tube is followed into the stomach.  Right IJ central line tip projects over the SVC.  Heart size stable.  Moderate diffuse bilateral air space disease, bibasilar dependent, with slight interval improved aeration bilaterally.  No definite pleural fluid.  IMPRESSION: Slight interval improvement in diffuse bilateral air space disease, which may be due to edema.   Original Report Authenticated By: Leanna Battles, M.D.    Dg Chest  Portable 1 View  05/06/2012  *RADIOLOGY REPORT*  Clinical Data: Central venous catheter placement bedside.  PORTABLE CHEST - 1 VIEW 05/06/2012 1118 hours:  Comparison: Portable chest x-ray earlier same day 0919 hours.  Findings: Right jugular central venous catheter tip projects over the SVC (patient rotated to the left).  Cardiomegaly and dense airspace consolidation throughout both lungs, unchanged.  No evidence of pneumothorax mediastinal hematoma.  Endotracheal tube tip remains in satisfactory position approximately 4 cm above the carina.  Nasogastric tube courses below the diaphragm.  IMPRESSION:  1.  Right jugular central venous catheter tip projects over the SVC.  No acute complicating features. 2.  Remaining support apparatus satisfactory. 3.  Stable severe bilateral pneumonia/edema/ARDS.   Original Report Authenticated By: Hulan Saas, M.D.    Dg Chest Portable 1 View  05/06/2012  *RADIOLOGY REPORT*  Clinical Data: Short of breath  PORTABLE CHEST - 1 VIEW  Comparison: Prior chest x-ray and CT scan of the chest 10/19/2011  Findings: The patient is intubated.  The carina is difficult to identify, the tip of the endotracheal tube is likely just above the carina.  A nasogastric tube is in place, the tip lies below the diaphragm off the field of view.  Diffuse bilateral interstitial opacities with confluent airspace disease in the bilateral lower lobes.  Cardiomegaly appears slightly progressed compared to the prior study.  No large pleural effusion or pneumothorax identified.  IMPRESSION:  1.  Interval development of diffuse bilateral interstitial opacities with more confluent airspace disease in the bilateral lower lobes.  In the setting of slightly progressed cardiomegaly, findings are most consistent with congestive heart failure and acute  pulmonary edema.  Additional differential considerations include atypical infection, multilobar pneumonia, and in the appropriate clinical setting - aspiration.   2.  Interval intubation.  The tip of the endotracheal tube is low, likely just above the carina.  Recommend withdrawing 2 cm for more optimal placement.   Original Report Authenticated By: Malachy Moan, M.D.      ECG: new flip deep T waves precordial  DIAGNOSES: Principal Problem:  *Respiratory failure Active Problems:  Uncontrolled hypertension  Diabetes mellitus type 2 in obese  Hyperlipidemia  Multiple sclerosis  Acute pulmonary edema  Morbid obesity  Normal coronary arteries by cath 2008; Normal EF; moderate LVH on Echo  Acute on chronic diastolic HF (heart failure)  HTN (hypertension), malignant   ASSESSMENT / PLAN:  PULMONARY  ASSESSMENT: Acute resp failure from Acute Pulmonary Edema, Afterload PLAN:   -BD's -Aggressive diuresis on going -HTN control important -wean this am cpap5 ps 5, goal 30 min -  pcxr improved, re assess in am  -neg 1 liter goal consider extubatation, cath?  CARDIOVASCULAR  ASSESSMENT: HTN crisis,  NSTEMI PLAN:  -diuretics, maintain -antihypertensives GOAL SBP 150-170 or about 25% reduction from highest MAP , continue NTG IV drip g -may need additional HTN control continuous or increase  -2 d now -serial CE, likely needs cath Will d/w Cads timing of cath -max hydralazine, increase frequency to q4h  RENAL  ASSESSMENT:  ARF, ATN Lab Results  Component Value Date   CREATININE 1.41* 05/07/2012   CREATININE 0.92 05/06/2012   CREATININE 0.83 10/23/2011    PLAN:   ATN, continue lasix Chem in am   GASTROINTESTINAL  ASSESSMENT:  MO/NPO PLAN:   - OGT -start TF if not extubated ppi  HEMATOLOGIC  ASSESSMENT:  Leukocytosis, DVT risk PLAN:  WBC markedly better Will hemo concentrate also scd Add sub q hep  INFECTIOUS  ASSESSMENT:   No apparent infection but will pan culture demarg WBC< improved PLAN:   -pan culture -afebrile, monitor pcxr repeat for infiltrate devwelopmenet  ENDOCRINE  ASSESSMENT:   DM  PLAN:    SSI  NEUROLOGIC  ASSESSMENT: AMS/ MS PLAN:   -WUA -upright  CLINICAL SUMMARY: 61 yo MO AAF with recurrent ape. Diuresis, NSTEMI, will need cath?, follow crt in am , wean to extubate  The patient is critically ill with multiple organ systems failure and requires high complexity decision making for assessment and support, frequent evaluation and titration of therapies, application of advanced monitoring technologies and extensive interpretation of multiple databases.   Critical Care Time devoted to patient care services described in this note is  30  Minutes.  Mcarthur Rossetti. Tyson Alias, MD, FACP Pgr: [phone] [facility]

## 2012-05-08 ENCOUNTER — Encounter (HOSPITAL_COMMUNITY): Payer: Self-pay | Admitting: Cardiology

## 2012-05-08 ENCOUNTER — Inpatient Hospital Stay (HOSPITAL_COMMUNITY): Payer: 59

## 2012-05-08 DIAGNOSIS — I509 Heart failure, unspecified: Secondary | ICD-10-CM

## 2012-05-08 DIAGNOSIS — I119 Hypertensive heart disease without heart failure: Secondary | ICD-10-CM

## 2012-05-08 DIAGNOSIS — I5031 Acute diastolic (congestive) heart failure: Secondary | ICD-10-CM

## 2012-05-08 HISTORY — DX: Other hypertrophic cardiomyopathy: I11.9

## 2012-05-08 LAB — LEGIONELLA ANTIGEN, URINE

## 2012-05-08 LAB — BASIC METABOLIC PANEL
Calcium: 8.5 mg/dL (ref 8.4–10.5)
Creatinine, Ser: 1.27 mg/dL — ABNORMAL HIGH (ref 0.50–1.10)
GFR calc non Af Amer: 45 mL/min — ABNORMAL LOW (ref >90)
Glucose, Bld: 129 mg/dL — ABNORMAL HIGH (ref 70–99)
Sodium: 143 mEq/L (ref 135–145)

## 2012-05-08 LAB — CBC WITH DIFFERENTIAL/PLATELET
Basophils Absolute: 0 10*3/uL (ref 0.0–0.1)
Eosinophils Absolute: 0 10*3/uL (ref 0.0–0.7)
Eosinophils Relative: 0 % (ref 0–5)
MCH: 29.8 pg (ref 26.0–34.0)
MCHC: 31.9 g/dL (ref 30.0–36.0)
MCV: 93.5 fL (ref 78.0–100.0)
Platelets: 100 10*3/uL — ABNORMAL LOW (ref 150–400)
RDW: 15.9 % — ABNORMAL HIGH (ref 11.5–15.5)

## 2012-05-08 LAB — GLUCOSE, CAPILLARY
Glucose-Capillary: 114 mg/dL — ABNORMAL HIGH (ref 70–99)
Glucose-Capillary: 154 mg/dL — ABNORMAL HIGH (ref 70–99)

## 2012-05-08 MED ORDER — AMLODIPINE BESYLATE 10 MG PO TABS
10.0000 mg | ORAL_TABLET | Freq: Every day | ORAL | Status: DC
  Administered 2012-05-08 – 2012-05-15 (×8): 10 mg via ORAL
  Filled 2012-05-08 (×9): qty 1

## 2012-05-08 MED ORDER — METOPROLOL TARTRATE 50 MG PO TABS
75.0000 mg | ORAL_TABLET | Freq: Two times a day (BID) | ORAL | Status: DC
  Administered 2012-05-08 – 2012-05-15 (×15): 75 mg via ORAL
  Filled 2012-05-08 (×18): qty 1

## 2012-05-08 MED ORDER — INSULIN ASPART 100 UNIT/ML ~~LOC~~ SOLN
2.0000 [IU] | Freq: Three times a day (TID) | SUBCUTANEOUS | Status: DC
Start: 2012-05-09 — End: 2012-05-15
  Administered 2012-05-09 (×2): 4 [IU] via SUBCUTANEOUS
  Administered 2012-05-10: 2 [IU] via SUBCUTANEOUS
  Administered 2012-05-10: 4 [IU] via SUBCUTANEOUS
  Administered 2012-05-10: 2 [IU] via SUBCUTANEOUS

## 2012-05-08 MED ORDER — TRAZODONE HCL 100 MG PO TABS
100.0000 mg | ORAL_TABLET | Freq: Every day | ORAL | Status: DC
  Administered 2012-05-09 – 2012-05-14 (×6): 100 mg via ORAL
  Filled 2012-05-08 (×9): qty 1

## 2012-05-08 NOTE — Progress Notes (Signed)
Subjective: No complaints. Wishes to be extubated.  Objective: Vital signs in last 24 hours: Temp:  [97.9 F (36.6 C)-100 F (37.8 C)] 97.9 F (36.6 C) (12/03 0700) Pulse Rate:  [67-97] 77  (12/03 0700) Resp:  [17-22] 20  (12/03 0700) BP: (93-162)/(48-70) 130/62 mmHg (12/03 0320) SpO2:  [96 %-100 %] 98 % (12/03 0752) Arterial Line BP: (98-200)/(46-92) 138/63 mmHg (12/03 0700) FiO2 (%):  [30 %-40 %] 30 % (12/03 0800) Weight:  [98.9 kg (218 lb 0.6 oz)-106.7 kg (235 lb 3.7 oz)] 98.9 kg (218 lb 0.6 oz) (12/03 0400) Weight change: -3.071 kg (-6 lb 12.3 oz) Last BM Date: 05/07/12 Intake/Output from previous day: 12/02 0701 - 12/03 0700 In: 925 [I.V.:685; NG/GT:240] Out: 2305 [Urine:2305] Intake/Output this shift:    PE:  General: Intubated, alert, oriented, no distress Lungs: Clear to ascultation bilaterally Heart: RRR, no murmurs rubs or gallops Skin: warm and dry Extremities: 2+ radial pulses, 2+ pedals, No LEE Left wrist: no erythema or edema   Lab Results:  Basename 05/08/12 0428 05/07/12 1700  WBC 18.3* 20.1*  HGB 12.8 13.3  HCT 40.1 41.1  PLT 100* 104*   BMET  Basename 05/08/12 0428 05/07/12 1712  NA 143 142  K 4.3 4.4  CL 105 103  CO2 28 27  GLUCOSE 129* 159*  BUN 29* 23  CREATININE 1.27* 1.26*  CALCIUM 8.5 8.3*    Basename 05/07/12 0900 05/06/12 2220  TROPONINI 1.00* 1.20*    No results found for this basename: CHOL, HDL, LDLCALC, LDLDIRECT, TRIG, CHOLHDL   No results found for this basename: HGBA1C     Lab Results  Component Value Date   TSH 1.024 10/20/2011    Hepatic Function Panel  Basename 05/07/12 1712  PROT 6.9  ALBUMIN 2.9*  AST 23  ALT 29  ALKPHOS 87  BILITOT 0.9  BILIDIR --  IBILI --   No results found for this basename: CHOL in the last 72 hours No results found for this basename: PROTIME in the last 72 hours    EKG: Orders placed during the hospital encounter of 05/06/12  . EKG 12-LEAD  . EKG 12-LEAD  . EKG 12-LEAD   . EKG 12-LEAD  . EKG 12-LEAD  . EKG 12-LEAD  . EKG 12-LEAD  . EKG 12-LEAD  . EKG 12-LEAD  . EKG 12-LEAD    Studies/Results: Dg Chest Port 1 View  05/08/2012  *RADIOLOGY REPORT*  Clinical Data: Evaluate endotracheal tube  PORTABLE CHEST - 1 VIEW  Comparison: 05/07/2012; 05/06/2012; 10/19/2011; chest CT - 10/19/2011  Findings: Grossly unchanged borderline enlarged cardiac silhouette and mediastinal contours.  Stable position of support apparatus. No pneumothorax.  Improved aeration of the bilateral lung bases with persistent bibasilar opacities, left greater than right. Pulmonary vasculature is more distinct on the present examination. There is persistent thickening along the right minor fissure. Trace bilateral effusions are suspected, unchanged.  Unchanged bones including right rotator cuff repair.  IMPRESSION:  1.  Stable positioning of support apparatus.  No pneumothorax. 2.  Overall findings most suggestive of improving pulmonary edema and atelectasis.   Original Report Authenticated By: Tacey Ruiz, MD    Portable Chest Xray In Am  05/07/2012  *RADIOLOGY REPORT*  Clinical Data: Check endotracheal tube position.  PORTABLE CHEST - 1 VIEW  Comparison: 05/06/2012 and CT chest 10/19/2011.  Findings: Endotracheal tube is in satisfactory position. Nasogastric tube is followed into the stomach.  Right IJ central line tip projects over the SVC.  Heart size  stable.  Moderate diffuse bilateral air space disease, bibasilar dependent, with slight interval improved aeration bilaterally.  No definite pleural fluid.  IMPRESSION: Slight interval improvement in diffuse bilateral air space disease, which may be due to edema.   Original Report Authenticated By: Leanna Battles, M.D.    Dg Chest Portable 1 View  05/06/2012  *RADIOLOGY REPORT*  Clinical Data: Central venous catheter placement bedside.  PORTABLE CHEST - 1 VIEW 05/06/2012 1118 hours:  Comparison: Portable chest x-ray earlier same day 0919 hours.   Findings: Right jugular central venous catheter tip projects over the SVC (patient rotated to the left).  Cardiomegaly and dense airspace consolidation throughout both lungs, unchanged.  No evidence of pneumothorax mediastinal hematoma.  Endotracheal tube tip remains in satisfactory position approximately 4 cm above the carina.  Nasogastric tube courses below the diaphragm.  IMPRESSION:  1.  Right jugular central venous catheter tip projects over the SVC.  No acute complicating features. 2.  Remaining support apparatus satisfactory. 3.  Stable severe bilateral pneumonia/edema/ARDS.   Original Report Authenticated By: Hulan Saas, M.D.    Dg Chest Portable 1 View  05/06/2012  *RADIOLOGY REPORT*  Clinical Data: Short of breath  PORTABLE CHEST - 1 VIEW  Comparison: Prior chest x-ray and CT scan of the chest 10/19/2011  Findings: The patient is intubated.  The carina is difficult to identify, the tip of the endotracheal tube is likely just above the carina.  A nasogastric tube is in place, the tip lies below the diaphragm off the field of view.  Diffuse bilateral interstitial opacities with confluent airspace disease in the bilateral lower lobes.  Cardiomegaly appears slightly progressed compared to the prior study.  No large pleural effusion or pneumothorax identified.  IMPRESSION:  1.  Interval development of diffuse bilateral interstitial opacities with more confluent airspace disease in the bilateral lower lobes.  In the setting of slightly progressed cardiomegaly, findings are most consistent with congestive heart failure and acute  pulmonary edema.  Additional differential considerations include atypical infection, multilobar pneumonia, and in the appropriate clinical setting - aspiration.  2.  Interval intubation.  The tip of the endotracheal tube is low, likely just above the carina.  Recommend withdrawing 2 cm for more optimal placement.   Original Report Authenticated By: Malachy Moan, M.D.      Medications: I have reviewed the patient's current medications.  Assessment/Plan: HTN respiratory failure. Currently intubated. In weaning mode. Change to PO medications. 2D echo pending.   LOS: 2 days   INGOLD,LAURA R 05/08/2012, 8:10 AM  Agree with note written by Nada Boozer RNP  Amazingly nl coronary arteries given EKG abnormalities. VDRF in weaning mode. No further CP. Exam benign. Was on good medical regimen as OP. 2D echo results pending. Will need renal dopplers as an OP.  Runell Gess 05/08/2012 8:27 AM

## 2012-05-08 NOTE — Progress Notes (Signed)
PULMONARY  / CRITICAL CARE MEDICINE  Name: [name] Fry MRN: [id number] DOB: [birth year]    LOS: 2  REFERRING MD : EDP  CHIEF COMPLAINT:  Acute resp failure  BRIEF PATIENT DESCRIPTION:  61 yo MO AAF with multiple medical problems including MS/HTN/, smoker here with acute resp failure, secondary to pulm edema, HTN.  LINES / TUBES: 12-1 ott>> 12-1 CVL>> 12-1 a line  CULTURES: 12-1 sputum>>mod strep beta hemolytic group a 12-1 uc>> 12-1 bcx2>>   Lab 05/06/12 1033  PROCALCITON <0.10     ANTIBIOTICS: None Anti-infectives    None       SIGNIFICANT EVENTS:  12-1 intubated 12-2 neg balance, clearing pcxr 12/2- chest pain, cath neg CAD flow limiting 12/2 echo ef wnl, htn lvh, diastlic 1 12/3- weaning well  LEVEL OF CARE:  ICU PRIMARY SERVICE:  PCCM CONSULTANTS:  SVC CODE STATUS: full DIET:  npo DVT Px:  pas GI Px:  PPI  INTERVAL HISTORY:   VITAL SIGNS: Temp:  [97.9 F (36.6 C)-100 F (37.8 C)] 98.2 F (36.8 C) (12/03 0800) Pulse Rate:  [67-97] 75  (12/03 0800) Resp:  [13-22] 13  (12/03 0800) BP: (93-165)/(48-70) 165/65 mmHg (12/03 0800) SpO2:  [96 %-100 %] 100 % (12/03 0800) Arterial Line BP: (98-200)/(46-92) 161/70 mmHg (12/03 0800) FiO2 (%):  [30 %-40 %] 30 % (12/03 0800) Weight:  [98.9 kg (218 lb 0.6 oz)-106.7 kg (235 lb 3.7 oz)] 98.9 kg (218 lb 0.6 oz) (12/03 0400) HEMODYNAMICS: CVP:  [8 mmHg-14 mmHg] 11 mmHg VENTILATOR SETTINGS: Vent Mode:  [-] PSV;CPAP FiO2 (%):  [30 %-40 %] 30 % Set Rate:  [20 bmp] 20 bmp Vt Set:  [380 mL] 380 mL PEEP:  [5 cmH20] 5 cmH20 Pressure Support:  [5 cmH20] 5 cmH20 Plateau Pressure:  [9 cmH20-19 cmH20] 18 cmH20 INTAKE / OUTPUT: Intake/Output      12/02 0701 - 12/03 0700 12/03 0701 - 12/04 0700   P.O.  700   I.V. (mL/kg) 715 (7.2) 60 (0.6)   NG/GT 240    IV Piggyback     Total Intake(mL/kg) 955 (9.7) 760 (7.7)   Urine (mL/kg/hr) 2305 (1) 25   Total Output 2305 25   Net -1350 +735         PHYSICAL  EXAMINATION: General:  Alert, nonfocal Neuro:  Sedated rass 0 HEENT:  jvd resolved Cardiovascular:  hsr rrr Lungs: cta anterior, distant Abdomen:  Obese, BS wnl , no r/g Musculoskeletal:  intact Skin:  warm   LABS: Cbc  Lab 05/08/12 0428 05/07/12 1700 05/07/12 0400  WBC 18.3* -- --  HGB 12.8 13.3 14.7  HCT 40.1 41.1 44.6  PLT 100* 104* 111*    Chemistry   Lab 05/08/12 0428 05/07/12 1712 05/07/12 0400 05/06/12 1032  NA 143 142 138 --  K 4.3 4.4 4.0 --  CL 105 103 100 --  CO2 28 27 26  --  BUN 29* 23 20 --  CREATININE 1.27* 1.26* 1.41* --  CALCIUM 8.5 8.3* 8.6 --  MG -- -- -- 1.9  PHOS -- -- -- 5.2*  GLUCOSE 129* 159* 199* --    Liver fxn  Lab 05/07/12 1712 05/06/12 0911  AST 23 53*  ALT 29 40*  ALKPHOS 87 116  BILITOT 0.9 0.5  PROT 6.9 7.7  ALBUMIN 2.9* 3.0*   coags No results found for this basename: APTT:3,INR:3 in the last 168 hours Sepsis markers  Lab 05/06/12 1033 05/06/12 0932  LATICACIDVEN -- 4.3*  PROCALCITON <0.10 --  Cardiac markers  Lab 05/07/12 0900 05/06/12 2220 05/06/12 1545  CKTOTAL 203* -- --  CKMB 6.1* -- --  TROPONINI 1.00* 1.20* 1.41*   BNP No results found for this basename: PROBNP:3 in the last 168 hours ABG  Lab 05/07/12 0530 05/07/12 0314 05/06/12 1434  PHART 7.408 7.574* 7.357  PCO2ART 36.5 27.6* 46.1*  PO2ART 133.0* 203.0* 195.0*  HCO3 22.6 25.6* 25.8*  TCO2 23.7 26.4 27    CBG trend  Lab 05/08/12 0805 05/08/12 0416 05/07/12 2352 05/07/12 1938 05/07/12 1727  GLUCAP 137* 114* 126* 136* 168*    IMAGING:  Dg Chest Port 1 View  05/08/2012  *RADIOLOGY REPORT*  Clinical Data: Evaluate endotracheal tube  PORTABLE CHEST - 1 VIEW  Comparison: 05/07/2012; 05/06/2012; 10/19/2011; chest CT - 10/19/2011  Findings: Grossly unchanged borderline enlarged cardiac silhouette and mediastinal contours.  Stable position of support apparatus. No pneumothorax.  Improved aeration of the bilateral lung bases with persistent bibasilar  opacities, left greater than right. Pulmonary vasculature is more distinct on the present examination. There is persistent thickening along the right minor fissure. Trace bilateral effusions are suspected, unchanged.  Unchanged bones including right rotator cuff repair.  IMPRESSION:  1.  Stable positioning of support apparatus.  No pneumothorax. 2.  Overall findings most suggestive of improving pulmonary edema and atelectasis.   Original Report Authenticated By: Tacey Ruiz, MD    Portable Chest Xray In Am  05/07/2012  *RADIOLOGY REPORT*  Clinical Data: Check endotracheal tube position.  PORTABLE CHEST - 1 VIEW  Comparison: 05/06/2012 and CT chest 10/19/2011.  Findings: Endotracheal tube is in satisfactory position. Nasogastric tube is followed into the stomach.  Right IJ central line tip projects over the SVC.  Heart size stable.  Moderate diffuse bilateral air space disease, bibasilar dependent, with slight interval improved aeration bilaterally.  No definite pleural fluid.  IMPRESSION: Slight interval improvement in diffuse bilateral air space disease, which may be due to edema.   Original Report Authenticated By: Leanna Battles, M.D.    Dg Chest Portable 1 View  05/06/2012  *RADIOLOGY REPORT*  Clinical Data: Central venous catheter placement bedside.  PORTABLE CHEST - 1 VIEW 05/06/2012 1118 hours:  Comparison: Portable chest x-ray earlier same day 0919 hours.  Findings: Right jugular central venous catheter tip projects over the SVC (patient rotated to the left).  Cardiomegaly and dense airspace consolidation throughout both lungs, unchanged.  No evidence of pneumothorax mediastinal hematoma.  Endotracheal tube tip remains in satisfactory position approximately 4 cm above the carina.  Nasogastric tube courses below the diaphragm.  IMPRESSION:  1.  Right jugular central venous catheter tip projects over the SVC.  No acute complicating features. 2.  Remaining support apparatus satisfactory. 3.  Stable  severe bilateral pneumonia/edema/ARDS.   Original Report Authenticated By: Hulan Saas, M.D.    Dg Chest Portable 1 View  05/06/2012  *RADIOLOGY REPORT*  Clinical Data: Short of breath  PORTABLE CHEST - 1 VIEW  Comparison: Prior chest x-ray and CT scan of the chest 10/19/2011  Findings: The patient is intubated.  The carina is difficult to identify, the tip of the endotracheal tube is likely just above the carina.  A nasogastric tube is in place, the tip lies below the diaphragm off the field of view.  Diffuse bilateral interstitial opacities with confluent airspace disease in the bilateral lower lobes.  Cardiomegaly appears slightly progressed compared to the prior study.  No large pleural effusion or pneumothorax identified.  IMPRESSION:  1.  Interval  development of diffuse bilateral interstitial opacities with more confluent airspace disease in the bilateral lower lobes.  In the setting of slightly progressed cardiomegaly, findings are most consistent with congestive heart failure and acute  pulmonary edema.  Additional differential considerations include atypical infection, multilobar pneumonia, and in the appropriate clinical setting - aspiration.  2.  Interval intubation.  The tip of the endotracheal tube is low, likely just above the carina.  Recommend withdrawing 2 cm for more optimal placement.   Original Report Authenticated By: Malachy Moan, M.D.      ECG: new flip deep T waves precordial  DIAGNOSES: Principal Problem:  *Respiratory failure Active Problems:  Uncontrolled hypertension  Diabetes mellitus type 2 in obese  Hyperlipidemia  Multiple sclerosis  Acute pulmonary edema  Morbid obesity  Normal coronary arteries by cath 2008 & 12/ 2013; Normal EF; moderate LVH on Echo    Acute on chronic diastolic HF (heart failure)  HTN (hypertension), malignant   ASSESSMENT / PLAN:  PULMONARY  ASSESSMENT: Acute resp failure from Acute Pulmonary Edema, Afterload PLAN:    -BD's -hold further diuresis, see renal -pcxr resolving fast edema Wean cpap 5 ps 5, goal 30 min , assess rsbi -appears well -continue to control Afterload, sys is 130 today  CARDIOVASCULAR  ASSESSMENT: HTN crisis,  NSTEMI PLAN:  -diuretics, consider hold s/p cath -antihypertensives GOAL SBP 150 now, off ntg drip -2d echo  reviewed -cath reviewed -would favor maximizing beta blocker then add oral hydralazine  RENAL  ASSESSMENT:  ARF, ATN Lab Results  Component Value Date   CREATININE 1.27* 05/08/2012   CREATININE 1.26* 05/07/2012   CREATININE 1.41* 05/07/2012    PLAN:   ATN improved Dc lasix, agree Chem in am   GASTROINTESTINAL  ASSESSMENT:  MO/NPO PLAN:   - if not extubated, then start T F ppi  HEMATOLOGIC  ASSESSMENT:  Leukocytosis, DVT risk PLAN:  Wbc better Add sub q hep  INFECTIOUS  ASSESSMENT:   R/o PNA, favor non infectious etiology PLAN:   -pcxr resolving well with neg balance and NO ABX Noted strep in sputum, colonizer likely, if spike will treat  ENDOCRINE  ASSESSMENT:   DM  PLAN:   SSI  NEUROLOGIC  ASSESSMENT: AMS/ MS PLAN:   -WUA -upright  CLINICAL SUMMARY: 61 yo MO AAF with recurrent edema/ htn. Cath done. Treat HTN, wean to extubate  The patient is critically ill with multiple organ systems failure and requires high complexity decision making for assessment and support, frequent evaluation and titration of therapies, application of advanced monitoring technologies and extensive interpretation of multiple databases.   Critical Care Time devoted to patient care services described in this note is  30  Minutes.  Mcarthur Rossetti. Tyson Alias, MD, FACP Pgr: [phone] [facility]

## 2012-05-08 NOTE — Progress Notes (Signed)
eLink Physician-Brief Progress Note Patient Name: [name] Fry DOB: 1950/07/31 MRN: [id number]  Date of Service  05/08/2012   HPI/Events of Note  Insomnia - normally on trazodone 100 mg qhs  eICU Interventions  Order for home medication trazodone placed   Intervention Category Minor Interventions: Routine modifications to care plan (e.g. PRN medications for pain, fever)  DETERDING,ELIZABETH 05/08/2012, 11:43 PM

## 2012-05-08 NOTE — Procedures (Signed)
Extubation Procedure Note  Patient Details:   Name: MAZY CULTON DOB: [birth year] MRN: [id number]   Airway Documentation:  Airway 7.5 mm (Active)  Secured at (cm) 24 cm 05/08/2012  8:00 AM  Measured From Lips 05/08/2012  8:00 AM  Secured Location Center 05/08/2012  8:00 AM  Secured By [employer] 05/08/2012  8:00 AM  Tube Holder Repositioned Yes 05/08/2012  8:00 AM  Cuff Pressure (cm H2O) 26 cm H2O 05/08/2012  3:20 AM  Site Condition Dry 05/08/2012  8:00 AM   Pt extubated to 3lpm [place], wean parameters within normal limits nif 50, vc 550 cc's , suctioned airway. Pt doing incentive spirometer 550 cc's . No distress noted,  Evaluation  O2 sats: stable throughout Complications: No apparent complications Patient did tolerate procedure well. Bilateral Breath Sounds: Diminished;Clear Suctioning: Airway Yes  Renae Fickle 05/08/2012, 9:50 AM 2

## 2012-05-09 ENCOUNTER — Inpatient Hospital Stay (HOSPITAL_COMMUNITY): Payer: 59

## 2012-05-09 DIAGNOSIS — E049 Nontoxic goiter, unspecified: Secondary | ICD-10-CM | POA: Diagnosis present

## 2012-05-09 DIAGNOSIS — I214 Non-ST elevation (NSTEMI) myocardial infarction: Secondary | ICD-10-CM | POA: Diagnosis present

## 2012-05-09 LAB — CBC WITH DIFFERENTIAL/PLATELET
Eosinophils Absolute: 0.2 10*3/uL (ref 0.0–0.7)
Eosinophils Relative: 1 % (ref 0–5)
Hemoglobin: 12.1 g/dL (ref 12.0–15.0)
Lymphs Abs: 2.8 10*3/uL (ref 0.7–4.0)
MCH: 30 pg (ref 26.0–34.0)
MCV: 95.5 fL (ref 78.0–100.0)
Monocytes Relative: 9 % (ref 3–12)
Neutrophils Relative %: 69 % (ref 43–77)
RBC: 4.03 MIL/uL (ref 3.87–5.11)

## 2012-05-09 LAB — BASIC METABOLIC PANEL
CO2: 30 mEq/L (ref 19–32)
Glucose, Bld: 148 mg/dL — ABNORMAL HIGH (ref 70–99)
Potassium: 3.8 mEq/L (ref 3.5–5.1)
Sodium: 138 mEq/L (ref 135–145)

## 2012-05-09 LAB — PRO B NATRIURETIC PEPTIDE: Pro B Natriuretic peptide (BNP): 2561 pg/mL — ABNORMAL HIGH (ref 0–125)

## 2012-05-09 LAB — CULTURE, RESPIRATORY W GRAM STAIN

## 2012-05-09 LAB — GLUCOSE, CAPILLARY
Glucose-Capillary: 154 mg/dL — ABNORMAL HIGH (ref 70–99)
Glucose-Capillary: 179 mg/dL — ABNORMAL HIGH (ref 70–99)

## 2012-05-09 MED ORDER — HYDRALAZINE HCL 25 MG PO TABS
25.0000 mg | ORAL_TABLET | Freq: Three times a day (TID) | ORAL | Status: DC
  Filled 2012-05-09 (×4): qty 1

## 2012-05-09 MED ORDER — LOSARTAN POTASSIUM 50 MG PO TABS
50.0000 mg | ORAL_TABLET | Freq: Every day | ORAL | Status: DC
  Administered 2012-05-09 – 2012-05-11 (×3): 50 mg via ORAL
  Filled 2012-05-09 (×4): qty 1

## 2012-05-09 MED ORDER — ACETAMINOPHEN 325 MG PO TABS
650.0000 mg | ORAL_TABLET | Freq: Three times a day (TID) | ORAL | Status: DC | PRN
  Administered 2012-05-09: 650 mg via ORAL
  Filled 2012-05-09: qty 2

## 2012-05-09 MED ORDER — LEVALBUTEROL HCL 0.63 MG/3ML IN NEBU
0.6300 mg | INHALATION_SOLUTION | Freq: Four times a day (QID) | RESPIRATORY_TRACT | Status: DC | PRN
  Filled 2012-05-09: qty 3

## 2012-05-09 MED ORDER — METFORMIN HCL 500 MG PO TABS
1000.0000 mg | ORAL_TABLET | Freq: Two times a day (BID) | ORAL | Status: DC
  Administered 2012-05-10 – 2012-05-15 (×11): 1000 mg via ORAL
  Filled 2012-05-09 (×16): qty 2

## 2012-05-09 MED ORDER — POTASSIUM CHLORIDE CRYS ER 20 MEQ PO TBCR
40.0000 meq | EXTENDED_RELEASE_TABLET | Freq: Once | ORAL | Status: AC
  Administered 2012-05-09: 40 meq via ORAL
  Filled 2012-05-09 (×2): qty 1

## 2012-05-09 MED ORDER — PANTOPRAZOLE SODIUM 40 MG PO TBEC: 40.0000 mg | DELAYED_RELEASE_TABLET | Freq: Every day | ORAL | Status: DC

## 2012-05-09 MED ORDER — FUROSEMIDE 40 MG PO TABS
40.0000 mg | ORAL_TABLET | Freq: Every day | ORAL | Status: DC
  Administered 2012-05-09 – 2012-05-11 (×3): 40 mg via ORAL
  Filled 2012-05-09 (×4): qty 1

## 2012-05-09 MED ORDER — HYDRALAZINE HCL 50 MG PO TABS
50.0000 mg | ORAL_TABLET | Freq: Three times a day (TID) | ORAL | Status: DC
  Administered 2012-05-09 – 2012-05-11 (×6): 50 mg via ORAL
  Filled 2012-05-09 (×11): qty 1

## 2012-05-09 NOTE — Progress Notes (Signed)
PULMONARY  / CRITICAL CARE MEDICINE  Name: [name] Fry MRN: [id number] DOB: 10-28-1950    LOS: 3  REFERRING MD : EDP  CHIEF COMPLAINT:  Acute resp failure  BRIEF PATIENT DESCRIPTION:  61 yo MO AAF with multiple medical problems including MS/HTN/, smoker here with acute resp failure, secondary to pulm edema, HTN.  LINES / TUBES: 12-1 ett>>12/3 12-1 CVL>>12/4 12-1 a line>>>12/3  CULTURES: 12-1 sputum>>mod strep beta hemolytic group a 12-1 uc>> 12-1 bcx2>>   Lab 05/06/12 1033  PROCALCITON <0.10     ANTIBIOTICS: None Anti-infectives    None       SIGNIFICANT EVENTS:  12-1 intubated 12-2 neg balance, clearing pcxr 12/2- chest pain, cath neg CAD flow limiting 12/2 echo ef wnl, htn lvh, diastlic 1 12/3- weaning well, extubated   LEVEL OF CARE:  ICU to tele PRIMARY SERVICE:  PCCM to traid CONSULTANTS:  SVC CODE STATUS: full DIET:  npo DVT Px:  pas GI Px:  PPI  INTERVAL HISTORY:  No distress, BP more controlled  VITAL SIGNS: Temp:  [98.4 F (36.9 C)-99.2 F (37.3 C)] 99.2 F (37.3 C) (12/04 0806) Pulse Rate:  [73-98] 83  (12/04 0800) Resp:  [12-20] 13  (12/04 0800) BP: (103-174)/(36-102) 158/67 mmHg (12/04 0800) SpO2:  [95 %-100 %] 95 % (12/04 0800) Arterial Line BP: (154-177)/(66-79) 155/66 mmHg (12/03 1130) Weight:  [106.3 kg (234 lb 5.6 oz)] 106.3 kg (234 lb 5.6 oz) (12/04 0500) HEMODYNAMICS: CVP:  [8 mmHg] 8 mmHg VENTILATOR SETTINGS:   INTAKE / OUTPUT: Intake/Output      12/03 0701 - 12/04 0700 12/04 0701 - 12/05 0700   P.O. 700    I.V. (mL/kg) 500 (4.7) 20 (0.2)   NG/GT     Total Intake(mL/kg) 1200 (11.3) 20 (0.2)   Urine (mL/kg/hr) 1050 (0.4) 40   Total Output 1050 40   Net +150 -20         PHYSICAL EXAMINATION: General:  Alert, nonfocal Neuro:  Awake, in a chair, comfortable HEENT:  jvd wnl Cardiovascular:  hsr rrr Lungs: cta anterior, distant, cta Abdomen:  Obese, BS wnl , no r/g Musculoskeletal:  intact Skin:   warm   LABS: Cbc  Lab 05/09/12 0428 05/08/12 0428 05/07/12 1700  WBC 13.4* -- --  HGB 12.1 12.8 13.3  HCT 38.5 40.1 41.1  PLT 98* 100* 104*    Chemistry   Lab 05/09/12 0428 05/08/12 0428 05/07/12 1712 05/06/12 1032  NA 138 143 142 --  K 3.8 4.3 4.4 --  CL 101 105 103 --  CO2 30 28 27  --  BUN 21 29* 23 --  CREATININE 1.00 1.27* 1.26* --  CALCIUM 8.9 8.5 8.3* --  MG -- -- -- 1.9  PHOS -- -- -- 5.2*  GLUCOSE 148* 129* 159* --    Liver fxn  Lab 05/07/12 1712 05/06/12 0911  AST 23 53*  ALT 29 40*  ALKPHOS 87 116  BILITOT 0.9 0.5  PROT 6.9 7.7  ALBUMIN 2.9* 3.0*   coags No results found for this basename: APTT:3,INR:3 in the last 168 hours Sepsis markers  Lab 05/06/12 1033 05/06/12 0932  LATICACIDVEN -- 4.3*  PROCALCITON <0.10 --   Cardiac markers  Lab 05/07/12 0900 05/06/12 2220 05/06/12 1545  CKTOTAL 203* -- --  CKMB 6.1* -- --  TROPONINI 1.00* 1.20* 1.41*   BNP  Lab 05/09/12 0428  PROBNP 2561.0*   ABG  Lab 05/07/12 0530 05/07/12 0314 05/06/12 1434  PHART 7.408 7.574* 7.357  PCO2ART 36.5 27.6* 46.1*  PO2ART 133.0* 203.0* 195.0*  HCO3 22.6 25.6* 25.8*  TCO2 23.7 26.4 27    CBG trend  Lab 05/09/12 0808 05/08/12 2223 05/08/12 1755 05/08/12 1221 05/08/12 0805  GLUCAP 108* 154* 211* 148* 137*    IMAGING:  Dg Chest Port 1 View  05/09/2012  *RADIOLOGY REPORT*  Clinical Data: Edema, shortness of breath.  PORTABLE CHEST - 1 VIEW  Comparison: 05/08/2012.  Findings: Trachea is midline.  Heart size stable.  Right IJ central line tip projects over the SVC.  Nasogastric tube and endotracheal tube have been removed in the interval.  Bibasilar dependent air space disease persists.  Probable small bilateral pleural effusions.  IMPRESSION: Persistent mild congestive heart failure.   Original Report Authenticated By: Leanna Battles, M.D.    Dg Chest Port 1 View  05/08/2012  *RADIOLOGY REPORT*  Clinical Data: Evaluate endotracheal tube  PORTABLE CHEST - 1 VIEW   Comparison: 05/07/2012; 05/06/2012; 10/19/2011; chest CT - 10/19/2011  Findings: Grossly unchanged borderline enlarged cardiac silhouette and mediastinal contours.  Stable position of support apparatus. No pneumothorax.  Improved aeration of the bilateral lung bases with persistent bibasilar opacities, left greater than right. Pulmonary vasculature is more distinct on the present examination. There is persistent thickening along the right minor fissure. Trace bilateral effusions are suspected, unchanged.  Unchanged bones including right rotator cuff repair.  IMPRESSION:  1.  Stable positioning of support apparatus.  No pneumothorax. 2.  Overall findings most suggestive of improving pulmonary edema and atelectasis.   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  NSTEMI, Type 2- Troponin 1.4  Goiter, chronic   ASSESSMENT / PLAN:  PULMONARY  ASSESSMENT: Acute resp failure from Acute Pulmonary Edema, Afterload PLAN:   -ambulate, check sats -continue to control BP Lasix standing dose, agree O2 support No further pcxr needed  CARDIOVASCULAR  ASSESSMENT: HTN crisis,  NSTEMI PLAN:  -diuretics daily added -antihypertensives GOAL to sys 130, escalate meds, hydral -2d echo  reviewed -cath reviewed  RENAL  ASSESSMENT:  ARF, ATN Lab Results  Component Value Date   CREATININE 1.00 05/09/2012   CREATININE 1.27* 05/08/2012   CREATININE 1.26* 05/07/2012    PLAN:   ATN improved Chem in am back ion lasix Replace K   GASTROINTESTINAL  ASSESSMENT:  MO/NPO PLAN:    - diet, not on home ppi, dc ppi  HEMATOLOGIC  ASSESSMENT:  Leukocytosis, DVT risk PLAN:  sub q hep  INFECTIOUS  ASSESSMENT:   R/o PNA, favor non infectious etiology PLAN:   -follow fever curve Would currently NOT treat strep  ENDOCRINE  ASSESSMENT:   DM  PLAN:   SSI  NEUROLOGIC  ASSESSMENT: AMS/ MS PLAN:   -WUA -upright ambulate  CLINICAL SUMMARY: 61 yo MO AAF with recurrent edema/ htn. Cath done. Treat HTN, extubated, control BP, lasix daily  To traid, tele Mcarthur Rossetti. Tyson Alias, MD, FACP Pgr: [phone] [facility]

## 2012-05-09 NOTE — Progress Notes (Signed)
Inpatient Diabetes Program Recommendations  AACE/ADA: New Consensus Statement on Inpatient Glycemic Control (2013)  Target Ranges:  Prepandial:   less than 140 mg/dL      Peak postprandial:   less than 180 mg/dL (1-2 hours)      Critically ill patients:  140 - 180 mg/dL   Reason for Visit: Please change correction Novolog scale to moderate tid with meals (instead of ICU scale).    Note: Will follow

## 2012-05-09 NOTE — Progress Notes (Signed)
Nutrition Follow-up  Intervention:   No nutrition interventions at this time RD will continue to follow   Assessment:   Extubated 12/3. Diet advanced to Heart Healthy.  Planned to transfer to stepdown and ambulate.  Pt denies any problems chewing or swallowing. Appetite is fair.    Diet Order:  Heart Healthy   Meds: Scheduled Meds:   . amLODipine  10 mg Oral Daily  . furosemide  40 mg Oral Daily  . heparin  5,000 Units Subcutaneous Q8H  . hydrALAZINE  50 mg Oral Q8H  . insulin aspart  2-6 Units Subcutaneous TID WC  . insulin glargine  25 Units Subcutaneous QHS  . losartan  50 mg Oral Daily  . metoprolol tartrate  75 mg Oral BID  . [COMPLETED] potassium chloride  40 mEq Oral Once  . traZODone  100 mg Oral QHS  . [DISCONTINUED] antiseptic oral rinse  15 mL Mouth Rinse QID  . [DISCONTINUED] chlorhexidine  15 mL Mouth Rinse BID  . [DISCONTINUED] fentaNYL  100 mcg Intravenous Once  . [DISCONTINUED] hydrALAZINE  10 mg Intravenous Q4H  . [DISCONTINUED] hydrALAZINE  25 mg Oral Q8H  . [DISCONTINUED] insulin aspart  2-6 Units Subcutaneous Q4H  . [DISCONTINUED] levalbuterol  0.63 mg Nebulization Q6H  . [DISCONTINUED] pantoprazole  40 mg Oral Q0600  . [DISCONTINUED] pantoprazole (PROTONIX) IV  40 mg Intravenous QHS  . [DISCONTINUED] potassium chloride  20 mEq Per Tube TID   Continuous Infusions:   . dextrose    . [DISCONTINUED] sodium chloride 10 mL/hr (05/08/12 0800)  . [DISCONTINUED] sodium chloride Stopped (05/09/12 0800)   PRN Meds:.acetaminophen, dextrose, diazepam, fentaNYL, hydrALAZINE, levalbuterol, ondansetron (ZOFRAN) IV   CMP     Component Value Date/Time   NA 138 05/09/2012 0428   K 3.8 05/09/2012 0428   CL 101 05/09/2012 0428   CO2 30 05/09/2012 0428   GLUCOSE 148* 05/09/2012 0428   BUN 21 05/09/2012 0428   CREATININE 1.00 05/09/2012 0428   CALCIUM 8.9 05/09/2012 0428   PROT 6.9 05/07/2012 1712   ALBUMIN 2.9* 05/07/2012 1712   AST 23 05/07/2012 1712   ALT 29  05/07/2012 1712   ALKPHOS 87 05/07/2012 1712   BILITOT 0.9 05/07/2012 1712   GFRNONAA 60* 05/09/2012 0428   GFRAA 70* 05/09/2012 0428    CBG (last 3)   Basename 05/09/12 0808 05/08/12 2223 05/08/12 1755  GLUCAP 108* 154* 211*     Intake/Output Summary (Last 24 hours) at 05/09/12 1249 Last data filed at 05/09/12 1200  Gross per 24 hour  Intake    400 ml  Output   1190 ml  Net   -790 ml    Weight Status:  234 lbs, up from 218 lbs yesterday?   236 lbs on admission   Re-estimated needs:  1800-2000 kcal, 65-75 gm protein   Nutrition Dx:  Inadequate oral intake.  --resolving   Goal:  EN goal no longer applicable  New Goal: Meet >/=90% estimated nutrition needs  Monitor:  PO intake, weight, I/O's   Clarene Duke RD, LDN Pager [phone] After Hours pager [phone]

## 2012-05-09 NOTE — Clinical Social Work Note (Signed)
Clinical Social Worker received inappropriate referral for medication assistance. CSW will defer to CM for concern regarding medication. CSW will sign off, but available if new needs arises.   Rozetta Nunnery MSW, [employer] [phone]

## 2012-05-09 NOTE — Progress Notes (Signed)
Subjective:  No complaints.  Objective:  Vital Signs in the last 24 hours: Temp:  [98.4 F (36.9 C)-99.2 F (37.3 C)] 99.2 F (37.3 C) (12/04 0806) Pulse Rate:  [73-98] 84  (12/04 0700) Resp:  [12-20] 12  (12/04 0700) BP: (103-174)/(36-102) 160/49 mmHg (12/04 0700) SpO2:  [97 %-100 %] 98 % (12/04 0700) Arterial Line BP: (154-177)/(66-79) 155/66 mmHg (12/03 1130) Weight:  [106.3 kg (234 lb 5.6 oz)] 106.3 kg (234 lb 5.6 oz) (12/04 0500)  Intake/Output from previous day:  Intake/Output Summary (Last 24 hours) at 05/09/12 1610 Last data filed at 05/09/12 0700  Gross per 24 hour  Intake    470 ml  Output   1025 ml  Net   -555 ml    Physical Exam: General appearance: alert, cooperative and no distress Lungs: clear to auscultation bilaterally Neck: Goiter present Heart: regular rate and rhythm Extremities: No LEE Pulses: 2+ and symmetric Skin: warm and dry Neurologic: Grossly normal   Rate: 84  Rhythm: normal sinus rhythm  Lab Results:  Basename 05/09/12 0428 05/08/12 0428  WBC 13.4* 18.3*  HGB 12.1 12.8  PLT 98* 100*    Basename 05/09/12 0428 05/08/12 0428  NA 138 143  K 3.8 4.3  CL 101 105  CO2 30 28  GLUCOSE 148* 129*  BUN 21 29*  CREATININE 1.00 1.27*    Basename 05/07/12 0900 05/06/12 2220  TROPONINI 1.00* 1.20*   Hepatic Function Panel  Basename 05/07/12 1712  PROT 6.9  ALBUMIN 2.9*  AST 23  ALT 29  ALKPHOS 87  BILITOT 0.9  BILIDIR --  IBILI --   No results found for this basename: CHOL in the last 72 hours No results found for this basename: INR in the last 72 hours  Imaging: Imaging results have been reviewed  Cardiac Studies:  Assessment/Plan:   Principal Problem:  *Respiratory failure, intubated on adm, extubated 12/3  Acute Pulmonary Edema Active Problems:  Acute on chronic diastolic HF (heart failure)  HTN (hypertension), malignant, improved  Diabetes mellitus type 2 in obese  Multiple sclerosis  Hypertensive hypertrophic  cardiomyopathy, by echo 05/07/12  Hyperlipidemia  Morbid obesity  Normal coronary arteries by cath 2008 & 12/ 2013; Normal EF;  LVH on Echo     Goiter, followed by Dr Chestine Spore   Plan: Extubated yesterday w/o complication. CP free. BP and HR stable. BNP down to 2561. Cr. Improved to 1.00 (1.27 yesterday).  Will initiate daily Lasix, 40 Mg and 50 mg of Losartan daily plus 25 mg Hydralazine TID. Restart Glucophage tomorrow. Will monitor kidney function. D/C central line. D/C folly and ambulate.  Transfer to stepdown.    Corine Shelter PA-C 05/09/2012, 8:21 AM   I have seen and evaluated the patient this morning along with Corine Shelter, PA. I agree with his findings, examination as well as impression recommendations.  She looks much better today. Agreed that thankfully no notable CAD with such an abnormal ECG. . Echo shows severe LVH & diastolic dysfunction.  CVP now ~8-10.  Likely fully diuresed. BP still high -- as Cr improved post cath, will restart @ 1/2 dose home ARB & change Hydralazine to standing PO. Would keep on standing dose of Lasix to avoid volume overload in future.  No rales or edema on exam  Transfer out oc CCU & d/c lines/ foley. Ambulate  Marykay Lex, M.D., M.S. [facility] [address] [place], [place]  [address]  [phone] Pager # [phone] 05/09/2012  8:25 AM

## 2012-05-10 ENCOUNTER — Inpatient Hospital Stay (HOSPITAL_COMMUNITY): Payer: 59

## 2012-05-10 DIAGNOSIS — D72829 Elevated white blood cell count, unspecified: Secondary | ICD-10-CM | POA: Diagnosis present

## 2012-05-10 LAB — URINE MICROSCOPIC-ADD ON

## 2012-05-10 LAB — BASIC METABOLIC PANEL
BUN: 16 mg/dL (ref 6–23)
BUN: 14 mg/dL (ref 6–23)
CO2: 27 mEq/L (ref 19–32)
CO2: 27 mEq/L (ref 19–32)
Calcium: 9.2 mg/dL (ref 8.4–10.5)
Calcium: 9.3 mg/dL (ref 8.4–10.5)
Chloride: 106 mEq/L (ref 96–112)
Chloride: 103 mEq/L (ref 96–112)
Creatinine, Ser: 1.02 mg/dL (ref 0.50–1.10)
Creatinine, Ser: 0.98 mg/dL (ref 0.50–1.10)
GFR calc Af Amer: 68 mL/min — ABNORMAL LOW (ref >90)
GFR calc Af Amer: 71 mL/min — ABNORMAL LOW (ref >90)
GFR calc non Af Amer: 59 mL/min — ABNORMAL LOW (ref >90)
GFR calc non Af Amer: 61 mL/min — ABNORMAL LOW (ref >90)
Glucose, Bld: 136 mg/dL — ABNORMAL HIGH (ref 70–99)
Glucose, Bld: 151 mg/dL — ABNORMAL HIGH (ref 70–99)
Potassium: 4.3 mEq/L (ref 3.5–5.1)
Potassium: 4.1 mEq/L (ref 3.5–5.1)
Sodium: 143 mEq/L (ref 135–145)
Sodium: 140 mEq/L (ref 135–145)

## 2012-05-10 LAB — CBC WITH DIFFERENTIAL/PLATELET
Basophils Absolute: 0 10*3/uL (ref 0.0–0.1)
Basophils Relative: 0 % (ref 0–1)
Eosinophils Absolute: 0.3 10*3/uL (ref 0.0–0.7)
Eosinophils Relative: 2 % (ref 0–5)
HCT: 42.1 % (ref 36.0–46.0)
Hemoglobin: 13.5 g/dL (ref 12.0–15.0)
Lymphocytes Relative: 24 % (ref 12–46)
Lymphs Abs: 3.1 10*3/uL (ref 0.7–4.0)
MCH: 30.1 pg (ref 26.0–34.0)
MCHC: 32.1 g/dL (ref 30.0–36.0)
MCV: 94 fL (ref 78.0–100.0)
Monocytes Absolute: 1 10*3/uL (ref 0.1–1.0)
Monocytes Relative: 8 % (ref 3–12)
Neutro Abs: 8.4 10*3/uL — ABNORMAL HIGH (ref 1.7–7.7)
Neutrophils Relative %: 66 % (ref 43–77)
Platelets: 106 10*3/uL — ABNORMAL LOW (ref 150–400)
RBC: 4.48 MIL/uL (ref 3.87–5.11)
RDW: 15.4 % (ref 11.5–15.5)
WBC: 12.7 10*3/uL — ABNORMAL HIGH (ref 4.0–10.5)

## 2012-05-10 LAB — GLUCOSE, CAPILLARY
Glucose-Capillary: 137 mg/dL — ABNORMAL HIGH (ref 70–99)
Glucose-Capillary: 113 mg/dL — ABNORMAL HIGH (ref 70–99)

## 2012-05-10 LAB — URINALYSIS, ROUTINE W REFLEX MICROSCOPIC
Glucose, UA: NEGATIVE mg/dL
Protein, ur: NEGATIVE mg/dL
Urobilinogen, UA: 1 mg/dL (ref 0.0–1.0)

## 2012-05-10 MED ORDER — ALUM & MAG HYDROXIDE-SIMETH 200-200-20 MG/5ML PO SUSP
30.0000 mL | Freq: Once | ORAL | Status: AC
  Administered 2012-05-10: 30 mL via ORAL
  Filled 2012-05-10: qty 30

## 2012-05-10 MED ORDER — LEVOFLOXACIN IN D5W 500 MG/100ML IV SOLN
500.0000 mg | INTRAVENOUS | Status: DC
  Filled 2012-05-10 (×2): qty 100

## 2012-05-10 MED ORDER — GUAIFENESIN-CODEINE 100-10 MG/5ML PO SOLN: 5.0000 mL | ORAL | Status: DC | PRN

## 2012-05-10 MED ORDER — NITROGLYCERIN 0.4 MG SL SUBL
SUBLINGUAL_TABLET | SUBLINGUAL | Status: AC
  Administered 2012-05-10: 12:00:00
  Filled 2012-05-10: qty 25

## 2012-05-10 MED ORDER — LEVOFLOXACIN IN D5W 750 MG/150ML IV SOLN
750.0000 mg | INTRAVENOUS | Status: DC
  Administered 2012-05-11 – 2012-05-12 (×2): 750 mg via INTRAVENOUS
  Filled 2012-05-10 (×2): qty 150

## 2012-05-10 MED ORDER — INTERFERON BETA-1A 30 MCG/0.5ML IM KIT: 30.0000 ug | PACK | Freq: Once | INTRAMUSCULAR | Status: DC

## 2012-05-10 MED ORDER — DEXTROMETHORPHAN POLISTIREX 30 MG/5ML PO LQCR
15.0000 mg | Freq: Every evening | ORAL | Status: DC | PRN
  Administered 2012-05-11: 15 mg via ORAL
  Filled 2012-05-10 (×2): qty 5

## 2012-05-10 NOTE — Progress Notes (Signed)
Called to see by RN for chest pain. Pt c/o localized mid sternal pain, worse with coughing, onset after swallowing her luinch. EKG unchanged. Will Rx with Maalox.   Corine Shelter PA-C 05/10/2012 12:45 PM

## 2012-05-10 NOTE — Progress Notes (Signed)
Pt c/o chest pain rated 10/10 described as "burning and pressure" with some nausea.  PRN zofran given IV.  Pt reports relief of nausea at the completion of slow push of med.  PtBP 189/73,heart rate 83, 100% on RA.  Pt placed on 2L/02 via [place].  Stat EKG performed.  1 sublingual nitro given.  After 5 mins, BP 170/71, heart rate 84, chest pain rated 7/10.  Additional 1 sublingual nitro given.  After , pt denies any chest pain rating pain as 0/10.  MD has been made aware.  New orders given.  Will continue to monitor. Nino Glow RN

## 2012-05-10 NOTE — Progress Notes (Signed)
Pt ambulated in the hallway on RA.  O2 sats 96-100% while ambulating.  Will continue to monitor. Nino Glow RN

## 2012-05-10 NOTE — Progress Notes (Signed)
Subjective:  C/O cough, brown sputum.  Objective:  Vital Signs in the last 24 hours: Temp:  [98.1 F (36.7 C)-99.2 F (37.3 C)] 98.1 F (36.7 C) (12/05 0449) Pulse Rate:  [74-83] 74  (12/04 1200) Resp:  [13-18] 18  (12/04 2349) BP: (123-164)/(48-97) 164/80 mmHg (12/05 0449) SpO2:  [94 %-97 %] 96 % (12/05 0449) Weight:  [106 kg (233 lb 11 oz)] 106 kg (233 lb 11 oz) (12/05 0500)  Intake/Output from previous day:  Intake/Output Summary (Last 24 hours) at 05/10/12 0751 Last data filed at 05/10/12 0700  Gross per 24 hour  Intake    740 ml  Output   2090 ml  Net  -1350 ml    Physical Exam: General appearance: alert, cooperative, no distress and morbidly obese Lungs: clear to auscultation bilaterally Heart: regular rate and rhythm Extrem: no edema   Rate: 74  Rhythm: normal sinus rhythm  Lab Results:  Basename 05/09/12 0428 05/08/12 0428  WBC 13.4* 18.3*  HGB 12.1 12.8  PLT 98* 100*    Basename 05/10/12 0528 05/09/12 0428  NA 143 138  K 4.3 3.8  CL 106 101  CO2 27 30  GLUCOSE 136* 148*  BUN 16 21  CREATININE 1.02 1.00    Basename 05/07/12 0900  TROPONINI 1.00*   Hepatic Function Panel  Basename 05/07/12 1712  PROT 6.9  ALBUMIN 2.9*  AST 23  ALT 29  ALKPHOS 87  BILITOT 0.9  BILIDIR --  IBILI --   No results found for this basename: CHOL in the last 72 hours No results found for this basename: INR in the last 72 hours  Imaging: Imaging results have been reviewed  Cardiac Studies:  Assessment/Plan:   Principal Problem:  *Respiratory failure on admission 12/01, extubated 12/03  Active Problems:  Acute on chronic diastolic HF   Diabetes mellitus type 2 in obese  Multiple sclerosis  HTN, un controlled on admission, now controlled  Hypertensive hypertrophic cardiomyopathy, 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 She has no fever. Will check cbc, consider antibiotics if WBC is high.Check BMP.   Corine Shelter PA-C 05/10/2012, 7:51 AM   I have seen and examined the patient along with Corine Shelter PA-C.  I have reviewed the chart, notes and new data.  I agree with PA's note.  Persistent productive cough, which per her report started even before she went into pulmonary edema.   Her decompensation was due to medications being stopped - she states she could not afford to buy them.  PLAN: Discuss antibiotic Rx w Dr. Tyson Alias. She is due her multiple sclerosis medication this Friday. SW support.  Thurmon Fair, MD, [facility] [phone] 05/10/2012, 8:51 AM

## 2012-05-10 NOTE — Progress Notes (Signed)
PULMONARY  / CRITICAL CARE MEDICINE  Name: ALEXANDRIA SHIFLETT MRN: [id number] DOB: 06/25/1950    LOS: 4  REFERRING MD : EDP  CHIEF COMPLAINT:  Acute resp failure  BRIEF PATIENT DESCRIPTION:  61 yo MO AAF with multiple medical problems including MS/HTN/, smoker here with acute resp failure, secondary to pulm edema, HTN.  LINES / TUBES: 12-1 ett>>12/3 12-1 CVL>>12/4 12-1 a line>>>12/3  CULTURES: 12-1 sputum>>mod strep beta hemolytic group a 12-1 uc>> 12-1 bcx2>>   Lab 05/06/12 1033  PROCALCITON <0.10     ANTIBIOTICS: None Anti-infectives    None       SIGNIFICANT EVENTS:  12-1 intubated 12-2 neg balance, clearing pcxr 12/2- chest pain, cath neg CAD flow limiting 12/2 echo ef wnl, htn lvh, diastlic 1 12/3- weaning well, extubated 12/4- looks great, coughing increased  LEVEL OF CARE:  tele PRIMARY SERVICE:  traid CONSULTANTS:  SVC CODE STATUS: full DIET:  npo DVT Px:  pas GI Px:  PPI  INTERVAL HISTORY:  Continues to cough  VITAL SIGNS: Temp:  [98.1 F (36.7 C)-98.7 F (37.1 C)] 98.5 F (36.9 C) (12/05 0815) Pulse Rate:  [74] 74  (12/04 1200) Resp:  [18] 18  (12/04 2349) BP: (123-164)/(48-97) 163/82 mmHg (12/05 0815) SpO2:  [94 %-97 %] 94 % (12/05 0815) Weight:  [106 kg (233 lb 11 oz)] 106 kg (233 lb 11 oz) (12/05 0500) HEMODYNAMICS:   VENTILATOR SETTINGS:   INTAKE / OUTPUT: Intake/Output      12/04 0701 - 12/05 0700 12/05 0701 - 12/06 0700   P.O. 720 240   I.V. (mL/kg) 20 (0.2)    Total Intake(mL/kg) 740 (7) 240 (2.3)   Urine (mL/kg/hr) 2090 (0.8)    Total Output 2090    Net -1350 +240         PHYSICAL EXAMINATION: General:  Alert, nonfocal Neuro:  Awake, in a chair, comfortable HEENT:  jvd wnl Cardiovascular:  hsr rrr Lungs: cta anterior, left base ronchi new Abdomen:  Obese, BS wnl , no r/g Musculoskeletal:  intact Skin:  warm   LABS: Cbc  Lab 05/09/12 0428 05/08/12 0428 05/07/12 1700  WBC 13.4* -- --  HGB 12.1 12.8 13.3   HCT 38.5 40.1 41.1  PLT 98* 100* 104*    Chemistry   Lab 05/10/12 0528 05/09/12 0428 05/08/12 0428 05/06/12 1032  NA 143 138 143 --  K 4.3 3.8 4.3 --  CL 106 101 105 --  CO2 27 30 28  --  BUN 16 21 29* --  CREATININE 1.02 1.00 1.27* --  CALCIUM 9.2 8.9 8.5 --  MG -- -- -- 1.9  PHOS -- -- -- 5.2*  GLUCOSE 136* 148* 129* --    Liver fxn  Lab 05/07/12 1712 05/06/12 0911  AST 23 53*  ALT 29 40*  ALKPHOS 87 116  BILITOT 0.9 0.5  PROT 6.9 7.7  ALBUMIN 2.9* 3.0*   coags No results found for this basename: APTT:3,INR:3 in the last 168 hours Sepsis markers  Lab 05/06/12 1033 05/06/12 0932  LATICACIDVEN -- 4.3*  PROCALCITON <0.10 --   Cardiac markers  Lab 05/07/12 0900 05/06/12 2220 05/06/12 1545  CKTOTAL 203* -- --  CKMB 6.1* -- --  TROPONINI 1.00* 1.20* 1.41*   BNP  Lab 05/09/12 0428  PROBNP 2561.0*   ABG  Lab 05/07/12 0530 05/07/12 0314 05/06/12 1434  PHART 7.408 7.574* 7.357  PCO2ART 36.5 27.6* 46.1*  PO2ART 133.0* 203.0* 195.0*  HCO3 22.6 25.6* 25.8*  TCO2 23.7 26.4  27    CBG trend  Lab 05/10/12 0713 05/09/12 2201 05/09/12 1550 05/09/12 1124 05/09/12 0808  GLUCAP 137* 179* 154* 178* 108*    IMAGING:  Dg Chest Port 1 View  05/09/2012  *RADIOLOGY REPORT*  Clinical Data: Edema, shortness of breath.  PORTABLE CHEST - 1 VIEW  Comparison: 05/08/2012.  Findings: Trachea is midline.  Heart size stable.  Right IJ central line tip projects over the SVC.  Nasogastric tube and endotracheal tube have been removed in the interval.  Bibasilar dependent air space disease persists.  Probable small bilateral pleural effusions.  IMPRESSION: Persistent mild congestive heart failure.   Original Report Authenticated By: Leanna Battles, M.D.      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  NSTEMI, Type 2- Troponin 1.4  Goiter, chronic  Leukocytosis   ASSESSMENT / PLAN:  PULMONARY  ASSESSMENT: Acute resp failure from Acute Pulmonary Edema, Afterload PLAN:   -ambulate, check sats -continue to control BP, may need to escalate hydral further Lasix standing dose, per cards O2 support to RA goal met No further pcxr needed as clinically she has done well, unless declines  INFECTIOUS  ASSESSMENT:   R/o PNA PLAN:   -more focal changes now to LLL -no fever -coughing increased -strep noted -will treat 7 days abx, pcn allergy, add levofloxacin  Remainder abx per primary [facility], will sign off  CLINICAL SUMMARY: 61 yo MO AAF with recurrent edema/ htn. Concern PNA / atx, start ABX  To traid, tele Mcarthur Rossetti. Tyson Alias, MD, FACP Pgr: [phone] [facility]

## 2012-05-11 DIAGNOSIS — I119 Hypertensive heart disease without heart failure: Secondary | ICD-10-CM

## 2012-05-11 DIAGNOSIS — I422 Other hypertrophic cardiomyopathy: Secondary | ICD-10-CM

## 2012-05-11 DIAGNOSIS — J189 Pneumonia, unspecified organism: Secondary | ICD-10-CM | POA: Diagnosis present

## 2012-05-11 DIAGNOSIS — I214 Non-ST elevation (NSTEMI) myocardial infarction: Secondary | ICD-10-CM

## 2012-05-11 LAB — GLUCOSE, CAPILLARY

## 2012-05-11 LAB — URINE CULTURE

## 2012-05-11 MED ORDER — LOSARTAN POTASSIUM 50 MG PO TABS
100.0000 mg | ORAL_TABLET | Freq: Every day | ORAL | Status: DC
  Administered 2012-05-12: 100 mg via ORAL
  Filled 2012-05-11 (×2): qty 2

## 2012-05-11 MED ORDER — VANCOMYCIN HCL 10 G IV SOLR
2000.0000 mg | Freq: Once | INTRAVENOUS | Status: AC
  Administered 2012-05-11: 2000 mg via INTRAVENOUS
  Filled 2012-05-11: qty 2000

## 2012-05-11 MED ORDER — GUAIFENESIN-DM 100-10 MG/5ML PO SYRP
5.0000 mL | ORAL_SOLUTION | ORAL | Status: DC | PRN
  Administered 2012-05-11 – 2012-05-15 (×3): 5 mL via ORAL
  Filled 2012-05-11 (×3): qty 5

## 2012-05-11 MED ORDER — VANCOMYCIN HCL IN DEXTROSE 1-5 GM/200ML-% IV SOLN
1000.0000 mg | Freq: Two times a day (BID) | INTRAVENOUS | Status: DC
  Administered 2012-05-12 (×2): 1000 mg via INTRAVENOUS
  Filled 2012-05-11 (×4): qty 200

## 2012-05-11 MED ORDER — HYDRALAZINE HCL 50 MG PO TABS
50.0000 mg | ORAL_TABLET | Freq: Two times a day (BID) | ORAL | Status: DC
  Administered 2012-05-11 – 2012-05-12 (×3): 50 mg via ORAL
  Filled 2012-05-11 (×5): qty 1

## 2012-05-11 MED ORDER — FUROSEMIDE 40 MG PO TABS
40.0000 mg | ORAL_TABLET | Freq: Two times a day (BID) | ORAL | Status: DC
  Administered 2012-05-11 – 2012-05-13 (×4): 40 mg via ORAL
  Filled 2012-05-11 (×6): qty 1

## 2012-05-11 NOTE — Progress Notes (Signed)
Pt did not get 1700 meal d/t MD order change. will hold insulin. give metformin once new dy1 meal arrives. Will continue to monitor

## 2012-05-11 NOTE — Progress Notes (Signed)
[facility] PROGRESS NOTE  [name] Fry:[id number] DOB: Jul 07, 1950 DOA: 05/06/2012 PCP: Judene Companion, MD  Brief narrative: 61 yo [place] AAF with multiple medical problems including MS/HTN/, smoker here with acute resp failure, secondary to pulm edema, HTN.  SIGNIFICANT EVENTS:  12-1 intubated  12-2 neg balance, clearing pcxr  12/2- chest pain, cath neg CAD flow limiting  12/2 echo ef wnl, htn lvh, diastlic 1  12/3- weaning well, extubated   Assessment/Plan: Principal Problem:  *Acute respiratory failure with hypoxia Secondary to pulmonary edema   Active Problems:  Acute on chronic diastolic HF (heart failure) magement per cardiology  Pneumonia?? Will start Levaquin and Vanc  for now as she was intubated - not much cough - if she has sputum, will have it sent for culture. Note that she is immunocompromised  Dysphagia Esophagram in AM- pureed diet agreed upon by pt.   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 Has congested cough. No dyspnea at rest currently.   Objective: Filed Vitals:   05/10/12 2116 05/11/12 0529 05/11/12 0924 05/11/12 1442  BP: 129/67 138/73 146/57 140/56  Pulse: 73 72 74 74  Temp: 98.2 F (36.8 C) 98.4 F (36.9 C) 98.3 F (36.8 C) 98.9 F (37.2 C)  TempSrc: Oral Oral Oral Oral  Resp: 18 18 18 18   Height:      Weight:  105.6 kg (232 lb 12.9 oz)    SpO2: 100% 100% 98% 97%    Intake/Output Summary  (Last 24 hours) at 05/11/12 1948 Last data filed at 05/11/12 1447  Gross per 24 hour  Intake    990 ml  Output   1027 ml  Net    -37 ml    Exam:   General:  Alert, no distress  Cardiovascular: RRR, no murmurs  Respiratory: b/l ronchi  Abdomen: soft, NT, ND< BS+  Ext: no c/c/e  Data Reviewed: Basic Metabolic Panel:  Lab 05/10/12 8119 05/10/12 0528 05/09/12 0428 05/08/12 0428 05/07/12 1712 05/06/12 1032  NA 140 143 138 143 142 --  K 4.1 4.3 3.8 4.3 4.4 --  CL 103 106 101 105 103 --  CO2 27 27 30 28 27  --  GLUCOSE 151* 136* 148* 129* 159* --  BUN 14 16 21  29* 23 --  CREATININE 0.98 1.02 1.00 1.27* 1.26* --  CALCIUM 9.3 9.2 8.9 8.5 8.3* --  MG -- -- -- -- -- 1.9  PHOS -- -- -- -- -- 5.2*   Liver Function Tests:  Lab 05/07/12 1712 05/06/12 0911  AST 23 53*  ALT 29 40*  ALKPHOS 87 116  BILITOT 0.9 0.5  PROT 6.9 7.7  ALBUMIN 2.9* 3.0*   No results found for this basename: LIPASE:5,AMYLASE:5 in the last 168 hours No results found for this basename: AMMONIA:5 in the last 168 hours CBC:  Lab 05/10/12 0900 05/09/12 0428 05/08/12 0428 05/07/12 1700 05/07/12 0400 05/06/12 0911  WBC 12.7* 13.4* 18.3* 20.1* 18.0* --  NEUTROABS 8.4*  9.2* 14.3* -- -- 21.9*  HGB 13.5 12.1 12.8 13.3 14.7 --  HCT 42.1 38.5 40.1 41.1 44.6 --  MCV 94.0 95.5 93.5 93.0 93.5 --  PLT 106* 98* 100* 104* 111* --   Cardiac Enzymes:  Lab 05/07/12 0900 05/06/12 2220 05/06/12 1545 05/06/12 1107  CKTOTAL 203* -- -- --  CKMB 6.1* -- -- --  CKMBINDEX -- -- -- --  TROPONINI 1.00* 1.20* 1.41* <0.30   BNP (last 3 results)  Basename 05/09/12 0428  PROBNP 2561.0*   CBG:  Lab 05/11/12 1616 05/11/12 1110 05/11/12 0712 05/10/12 2106 05/10/12 1812  GLUCAP 128* 106* 104* 113* 154*    Recent Results (from the past 240 hour(s))  CULTURE, BLOOD (ROUTINE X 2)     Status: Normal (Preliminary result)   Collection Time   05/06/12  9:30 AM      Component Value Range Status Comment   Specimen Description  BLOOD LEFT HAND   Final    Special Requests BOTTLES DRAWN AEROBIC ONLY 5CC   Final    Culture  Setup Time 05/06/2012 16:10   Final    Culture     Final    Value:        BLOOD CULTURE RECEIVED NO GROWTH TO DATE CULTURE WILL BE HELD FOR 5 DAYS BEFORE ISSUING A FINAL NEGATIVE REPORT   Report Status PENDING   Incomplete   CULTURE, BLOOD (ROUTINE X 2)     Status: Normal (Preliminary result)   Collection Time   05/06/12  9:35 AM      Component Value Range Status Comment   Specimen Description BLOOD LEFT HAND   Final    Special Requests BOTTLES DRAWN AEROBIC AND ANAEROBIC 10CC   Final    Culture  Setup Time 05/06/2012 16:10   Final    Culture     Final    Value:        BLOOD CULTURE RECEIVED NO GROWTH TO DATE CULTURE WILL BE HELD FOR 5 DAYS BEFORE ISSUING A FINAL NEGATIVE REPORT   Report Status PENDING   Incomplete   URINE CULTURE     Status: Normal   Collection Time   05/06/12  9:43 AM      Component Value Range Status Comment   Specimen Description URINE, CATHETERIZED   Final    Special Requests NONE   Final    Culture  Setup Time 05/06/2012 17:18   Final    Colony Count NO GROWTH   Final    Culture NO GROWTH   Final    Report Status 05/07/2012 FINAL   Final   MRSA PCR SCREENING     Status: Normal   Collection Time   05/06/12  1:39 PM      Component Value Range Status Comment   MRSA by PCR NEGATIVE  NEGATIVE Final   CULTURE, RESPIRATORY     Status: Normal   Collection Time   05/06/12  8:17 PM      Component Value Range Status Comment   Specimen Description TRACHEAL ASPIRATE   Final    Special Requests NONE   Final    Gram Stain     Final    Value: FEW WBC PRESENT, PREDOMINANTLY PMN     NO SQUAMOUS EPITHELIAL CELLS SEEN     RARE GRAM POSITIVE RODS   Culture MODERATE STREPTOCOCCUS,BETA HEMOLYIC NOT GROUP A   Final    Report Status 05/09/2012 FINAL   Final      Studies: Dg Chest  Port 1 View  05/10/2012  *RADIOLOGY REPORT*  Clinical Data: Unexplained leukocytosis.  Respiratory  failure. Chest pain.  PORTABLE CHEST - 1 VIEW  Comparison: 05/09/2012  Findings: Central venous catheter has been removed.  Pulmonary vascularity is normal.  There is loss of the left hemidiaphragm silhouette laterally consistent with an area of infiltrate or atelectasis.  The overall heart size is unchanged.  IMPRESSION: Focal area of infiltrate or atelectasis at the left base laterally, new since the prior exam.   Original Report Authenticated By: Francene Boyers, M.D.    Dg Chest Port 1 View  05/09/2012  *RADIOLOGY REPORT*  Clinical Data: Edema, shortness of breath.  PORTABLE CHEST - 1 VIEW  Comparison: 05/08/2012.  Findings: Trachea is midline.  Heart size stable.  Right IJ central line tip projects over the SVC.  Nasogastric tube and endotracheal tube have been removed in the interval.  Bibasilar dependent air space disease persists.  Probable small bilateral pleural effusions.  IMPRESSION: Persistent mild congestive heart failure.   Original Report Authenticated By: Leanna Battles, M.D.    Dg Chest Port 1 View  05/08/2012  *RADIOLOGY REPORT*  Clinical Data: Evaluate endotracheal tube  PORTABLE CHEST - 1 VIEW  Comparison: 05/07/2012; 05/06/2012; 10/19/2011; chest CT - 10/19/2011  Findings: Grossly unchanged borderline enlarged cardiac silhouette and mediastinal contours.  Stable position of support apparatus. No pneumothorax.  Improved aeration of the bilateral lung bases with persistent bibasilar opacities, left greater than right. Pulmonary vasculature is more distinct on the present examination. There is persistent thickening along the right minor fissure. Trace bilateral effusions are suspected, unchanged.  Unchanged bones including right rotator cuff repair.  IMPRESSION:  1.  Stable positioning of support apparatus.  No pneumothorax. 2.  Overall findings most suggestive of improving pulmonary edema and atelectasis.   Original Report Authenticated By: Tacey Ruiz, MD    Portable Chest Xray In  Am  05/07/2012  *RADIOLOGY REPORT*  Clinical Data: Check endotracheal tube position.  PORTABLE CHEST - 1 VIEW  Comparison: 05/06/2012 and CT chest 10/19/2011.  Findings: Endotracheal tube is in satisfactory position. Nasogastric tube is followed into the stomach.  Right IJ central line tip projects over the SVC.  Heart size stable.  Moderate diffuse bilateral air space disease, bibasilar dependent, with slight interval improved aeration bilaterally.  No definite pleural fluid.  IMPRESSION: Slight interval improvement in diffuse bilateral air space disease, which may be due to edema.   Original Report Authenticated By: Leanna Battles, M.D.    Dg Chest Portable 1 View  05/06/2012  *RADIOLOGY REPORT*  Clinical Data: Central venous catheter placement bedside.  PORTABLE CHEST - 1 VIEW 05/06/2012 1118 hours:  Comparison: Portable chest x-ray earlier same day 0919 hours.  Findings: Right jugular central venous catheter tip projects over the SVC (patient rotated to the left).  Cardiomegaly and dense airspace consolidation throughout both lungs, unchanged.  No evidence of pneumothorax mediastinal hematoma.  Endotracheal tube tip remains in satisfactory position approximately 4 cm above the carina.  Nasogastric tube courses below the diaphragm.  IMPRESSION:  1.  Right jugular central venous catheter tip projects over the SVC.  No acute complicating features. 2.  Remaining support apparatus satisfactory. 3.  Stable severe bilateral pneumonia/edema/ARDS.   Original Report Authenticated By: Hulan Saas, M.D.    Dg Chest Portable 1 View  05/06/2012  *RADIOLOGY REPORT*  Clinical Data: Short of breath  PORTABLE CHEST - 1 VIEW  Comparison: Prior chest x-ray and CT scan of the chest 10/19/2011  Findings: The patient is intubated.  The carina is difficult to identify, the tip of the endotracheal tube is likely just above the carina.  A nasogastric tube is in place, the tip lies below the diaphragm off the field of view.   Diffuse bilateral interstitial opacities with confluent airspace disease in the bilateral lower lobes.  Cardiomegaly appears slightly progressed compared to the prior study.  No large pleural effusion or pneumothorax identified.  IMPRESSION:  1.  Interval development of diffuse bilateral interstitial opacities with more confluent airspace disease in the bilateral lower lobes.  In the setting of slightly progressed cardiomegaly, findings are most consistent with congestive heart failure and acute  pulmonary edema.  Additional differential considerations include atypical infection, multilobar pneumonia, and in the appropriate clinical setting - aspiration.  2.  Interval intubation.  The tip of the endotracheal tube is low, likely just above the carina.  Recommend withdrawing 2 cm for more optimal placement.   Original Report Authenticated By: Malachy Moan, M.D.     Scheduled Meds:   . amLODipine  10 mg Oral Daily  . furosemide  40 mg Oral BID  . heparin  5,000 Units Subcutaneous Q8H  . hydrALAZINE  50 mg Oral BID  . insulin aspart  2-6 Units Subcutaneous TID WC  . insulin glargine  25 Units Subcutaneous QHS  . interferon beta-1a  30 mcg Intramuscular Once  . levofloxacin (LEVAQUIN) IV  750 mg Intravenous Q24H  . losartan  100 mg Oral Daily  . metFORMIN  1,000 mg Oral BID WC  . metoprolol tartrate  75 mg Oral BID  . traZODone  100 mg Oral QHS  . [DISCONTINUED] furosemide  40 mg Oral Daily  . [DISCONTINUED] hydrALAZINE  50 mg Oral Q8H  . [DISCONTINUED] losartan  50 mg Oral Daily   Continuous Infusions:   ________________________________________________________________________  Time spent: 35 min    [facility]  [facility] Pager [phone] If 8PM-8AM, please contact night-coverage at [email or web address], password [facility] 05/11/2012, 7:48 PM  LOS: 5 days

## 2012-05-11 NOTE — Progress Notes (Signed)
ANTIBIOTIC CONSULT NOTE - FOLLOW UP  Pharmacy Consult for Adding Vancomycin Indication: rule out pneumonia  Allergies  Allergen Reactions  . Codeine Other (See Comments)    unknown  . Penicillins Other (See Comments)    unknown  Patient Measurements: Height: 5\' 1"  (154.9 cm) Weight: 232 lb 12.9 oz (105.6 kg) IBW/kg (Calculated) : 47.8  Vital Signs: Temp: 98.9 F (37.2 C) (12/06 1442) Temp src: Oral (12/06 1442) BP: 140/56 mmHg (12/06 1442) Pulse Rate: 74  (12/06 1442) Intake/Output from previous day: 12/05 0701 - 12/06 0700 In: 840 [P.O.:840] Out: 1325 [Urine:1325] Intake/Output from this shift:    Labs:  Basename 05/10/12 0900 05/10/12 0528 05/09/12 0428  WBC 12.7* -- 13.4*  HGB 13.5 -- 12.1  PLT 106* -- 98*  LABCREA -- -- --  CREATININE 0.98 1.02 1.00   Estimated Creatinine Clearance: 68.3 ml/min (by C-G formula based on Cr of 0.98). No results found for this basename: VANCOTROUGH:2,VANCOPEAK:2,VANCORANDOM:2,GENTTROUGH:2,GENTPEAK:2,GENTRANDOM:2,TOBRATROUGH:2,TOBRAPEAK:2,TOBRARND:2,AMIKACINPEAK:2,AMIKACINTROU:2,AMIKACIN:2, in the last 72 hours   Microbiology: Recent Results (from the past 720 hour(s))  CULTURE, BLOOD (ROUTINE X 2)     Status: Normal (Preliminary result)   Collection Time   05/06/12  9:30 AM      Component Value Range Status Comment   Specimen Description BLOOD LEFT HAND   Final    Special Requests BOTTLES DRAWN AEROBIC ONLY 5CC   Final    Culture  Setup Time 05/06/2012 16:10   Final    Culture     Final    Value:        BLOOD CULTURE RECEIVED NO GROWTH TO DATE CULTURE WILL BE HELD FOR 5 DAYS BEFORE ISSUING A FINAL NEGATIVE REPORT   Report Status PENDING   Incomplete   CULTURE, BLOOD (ROUTINE X 2)     Status: Normal (Preliminary result)   Collection Time   05/06/12  9:35 AM      Component Value Range Status Comment   Specimen Description BLOOD LEFT HAND   Final    Special Requests BOTTLES DRAWN AEROBIC AND ANAEROBIC 10CC   Final    Culture   Setup Time 05/06/2012 16:10   Final    Culture     Final    Value:        BLOOD CULTURE RECEIVED NO GROWTH TO DATE CULTURE WILL BE HELD FOR 5 DAYS BEFORE ISSUING A FINAL NEGATIVE REPORT   Report Status PENDING   Incomplete   URINE CULTURE     Status: Normal   Collection Time   05/06/12  9:43 AM      Component Value Range Status Comment   Specimen Description URINE, CATHETERIZED   Final    Special Requests NONE   Final    Culture  Setup Time 05/06/2012 17:18   Final    Colony Count NO GROWTH   Final    Culture NO GROWTH   Final    Report Status 05/07/2012 FINAL   Final   MRSA PCR SCREENING     Status: Normal   Collection Time   05/06/12  1:39 PM      Component Value Range Status Comment   MRSA by PCR NEGATIVE  NEGATIVE Final   CULTURE, RESPIRATORY     Status: Normal   Collection Time   05/06/12  8:17 PM      Component Value Range Status Comment   Specimen Description TRACHEAL ASPIRATE   Final    Special Requests NONE   Final    Gram Stain  Final    Value: FEW WBC PRESENT, PREDOMINANTLY PMN     NO SQUAMOUS EPITHELIAL CELLS SEEN     RARE GRAM POSITIVE RODS   Culture MODERATE STREPTOCOCCUS,BETA HEMOLYIC NOT GROUP A   Final    Report Status 05/09/2012 FINAL   Final     Anti-infectives     Start     Dose/Rate Route Frequency Ordered Stop   05/11/12 1200   levofloxacin (LEVAQUIN) IVPB 750 mg        750 mg 100 mL/hr over 90 Minutes Intravenous Every 24 hours 05/10/12 1143 05/18/12 1159   05/10/12 1000   levofloxacin (LEVAQUIN) IVPB 500 mg  Status:  Discontinued        500 mg 100 mL/hr over 60 Minutes Intravenous Every 24 hours 05/10/12 0857 05/10/12 1143         Assessment: 61 YOF on day #2 of Levaquin for group a strep in tracheal aspirate, now to add on Vancomycin per pharmacy dosing for suspected pneumonia with LLL infiltrate on CXR. Patient is currently afebrile. WBC was 12.7 on 12/5 (decreased from 12/4). No repeat CBC available today. Patient's serum creatinine is stable  at 0.98/estCrCl~58mL/min.   12/1 BCx 2/2>> NGTD 12/1 Resp Cx>> strep (hemolytic group a) 12/1 UCx>> neg  Goal of Therapy:  Vancomycin trough level 15-20 mcg/ml  Plan:  1. Vancomycin 2g IV x1, then 1g IV q12h. 2. Follow-up renal function, clinical status, necessity of vanc trough at Css. 3. Follow-up ability to narrow antibiotics.   Fayne Norrie 05/11/2012,7:59 PM

## 2012-05-11 NOTE — Progress Notes (Signed)
Subjective:  C/o mild SOB last night while attempting to sleep supine. Reports that she had to sleep in chair overnight. No SOB currently. Denies CP. Still has productive cough with, "thick white" sputum.  Endorses chills and mild night sweats.  Objective:  Vital Signs in the last 24 hours: Temp:  [98 F (36.7 C)-98.4 F (36.9 C)] 98.4 F (36.9 C) (12/06 0529) Pulse Rate:  [67-86] 72  (12/06 0529) Resp:  [18-20] 18  (12/06 0529) BP: (126-189)/(47-73) 138/73 mmHg (12/06 0529) SpO2:  [100 %] 100 % (12/06 0529) Weight:  [105.6 kg (232 lb 12.9 oz)-106 kg (233 lb 11 oz)] 105.6 kg (232 lb 12.9 oz) (12/06 0529)  Intake/Output from previous day:  Intake/Output Summary (Last 24 hours) at 05/11/12 0847 Last data filed at 05/11/12 0755  Gross per 24 hour  Intake    600 ml  Output   1526 ml  Net   -926 ml    Physical Exam: General appearance: alert, cooperative, no distress and sitting upright in chair Lungs: clear to auscultation bilaterally and  Heart: regular rate and rhythm, S1, S2 normal, no murmur, click, rub or gallop Abdomen: soft, non-tender; bowel sounds normal; no masses,  no organomegaly Extremities: no LEE Pulses: 2+ and symmetric Skin: warm and dry Neurologic: Grossly normal    Rate: 72  Rhythm: normal sinus rhythm  Lab Results:  Basename 05/10/12 0900 05/09/12 0428  WBC 12.7* 13.4*  HGB 13.5 12.1  PLT 106* 98*    Basename 05/10/12 0900 05/10/12 0528  NA 140 143  K 4.1 4.3  CL 103 106  CO2 27 27  GLUCOSE 151* 136*  BUN 14 16  CREATININE 0.98 1.02   No results found for this basename: TROPONINI:2,CK,MB:2 in the last 72 hours Hepatic Function Panel No results found for this basename: PROT,ALBUMIN,AST,ALT,ALKPHOS,BILITOT,BILIDIR,IBILI in the last 72 hours No results found for this basename: CHOL in the last 72 hours No results found for this basename: INR in the last 72 hours  Imaging: Imaging results have been reviewed  Cardiac  Studies:  Assessment/Plan:   Principal Problem:  *Acute respiratory failure with hypoxia Active Problems:  Acute on chronic diastolic HF (heart failure)  Diabetes mellitus type 2, uncontrolled, with complications  Multiple sclerosis  HTN (hypertension), malignant  Hypertensive hypertrophic cardiomyopathy, 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 BP and HR stable. NSR on telemetry overnight. Pt still has productive cough, fever and elevated WBC and left lobe infiltrate on CXR. Was started on antibiotics by hospitalist.  Ambulate today.  Will review medications with MD today, question push Cozaar and decrease hydralazine. Will arrange follow-up. Pt also has epigastric pain and difficulty swallowing. ? GI workup.    Corine Shelter PA-C 05/11/2012, 8:47 AM  I have seen and evaluated the patient this morning along with Corine Shelter, PA. I agree with his findings, examination as well as impression recommendations.  I suspect that her CP is likely MSK or pleuritic in nature with ? PNA on CXR.   I agree with going back to home dose of ARB to 100 mg  & back down Hydralazine to 50mg  bid.   With BNP still elevated & c/o orthopnea -- with increase Lasix to BID for now.    Otherwise, stable from a cardiac standpoint.  PNA care per [facility] service.  Marykay Lex, M.D., M.S. [facility] [address] [place], [place]  [address]  [phone] Pager # [phone] 05/11/2012 12:59 PM

## 2012-05-12 LAB — CULTURE, BLOOD (ROUTINE X 2): Culture: NO GROWTH

## 2012-05-12 LAB — BASIC METABOLIC PANEL
CO2: 29 mEq/L (ref 19–32)
Chloride: 100 mEq/L (ref 96–112)
Creatinine, Ser: 1.28 mg/dL — ABNORMAL HIGH (ref 0.50–1.10)
Glucose, Bld: 99 mg/dL (ref 70–99)

## 2012-05-12 LAB — CBC
HCT: 38.5 % (ref 36.0–46.0)
Hemoglobin: 12.3 g/dL (ref 12.0–15.0)
MCV: 94.6 fL (ref 78.0–100.0)
RBC: 4.07 MIL/uL (ref 3.87–5.11)
RDW: 15.4 % (ref 11.5–15.5)
WBC: 11.3 10*3/uL — ABNORMAL HIGH (ref 4.0–10.5)

## 2012-05-12 LAB — GLUCOSE, CAPILLARY
Glucose-Capillary: 104 mg/dL — ABNORMAL HIGH (ref 70–99)
Glucose-Capillary: 104 mg/dL — ABNORMAL HIGH (ref 70–99)

## 2012-05-12 LAB — TRIGLYCERIDES: Triglycerides: 115 mg/dL (ref <150)

## 2012-05-12 LAB — PROCALCITONIN: Procalcitonin: 0.69 ng/mL

## 2012-05-12 MED ORDER — PANTOPRAZOLE SODIUM 40 MG PO TBEC
40.0000 mg | DELAYED_RELEASE_TABLET | Freq: Two times a day (BID) | ORAL | Status: DC
  Administered 2012-05-12 – 2012-05-15 (×6): 40 mg via ORAL
  Filled 2012-05-12 (×6): qty 1

## 2012-05-12 MED ORDER — INTERFERON BETA-1A 30 MCG/0.5ML IM KIT
30.0000 ug | PACK | Freq: Once | INTRAMUSCULAR | Status: AC
  Administered 2012-05-12: 30 ug via INTRAMUSCULAR

## 2012-05-12 MED ORDER — HYDROCODONE-ACETAMINOPHEN 5-325 MG PO TABS
1.0000 | ORAL_TABLET | Freq: Four times a day (QID) | ORAL | Status: DC | PRN
  Administered 2012-05-12 – 2012-05-15 (×6): 1 via ORAL
  Filled 2012-05-12 (×6): qty 1

## 2012-05-12 MED ORDER — LEVOFLOXACIN 750 MG PO TABS
750.0000 mg | ORAL_TABLET | ORAL | Status: DC
  Filled 2012-05-12: qty 1

## 2012-05-12 NOTE — Progress Notes (Signed)
PHARMACIST - PHYSICIAN COMMUNICATION DR:   Wilmon Arms al. CONCERNING: Antibiotic IV to Oral Route Change Policy  RECOMMENDATION: This patient is receiving levofloxacin by the intravenous route.  Based on criteria approved by the Pharmacy and Therapeutics Committee, the antibiotic(s) is/are being converted to the equivalent oral dose form(s). To start with dose on 05/13/12.   DESCRIPTION: These criteria include:  Patient being treated for a respiratory tract infection, urinary tract infection, or cellulitis  The patient is not neutropenic and does not exhibit a GI malabsorption state  The patient is eating (either orally or via tube) and/or has been taking other orally administered medications for a least 24 hours  The patient is improving clinically and has a Tmax < 100.5  If you have questions about this conversion, please contact the Pharmacy Department  []   [phone] )  [facility] [x]   [phone] )  [facility]  []   [phone] )  [facility] []   [phone] )  [facility]   Herby Abraham, [place].D. [phone] 05/12/2012 2:06 PM

## 2012-05-12 NOTE — Progress Notes (Signed)
[facility] PROGRESS NOTE PCCM transfer  [name] Fry WUJ:[id number] DOB: 1950-12-14 DOA: 05/06/2012 PCP: Judene Companion, MD  Brief narrative: 61 yo [place] AAF with multiple medical problems including MS/HTN/, smoker here with acute resp failure, secondary to pulm edema, HTN.  SIGNIFICANT EVENTS:  12-1 intubated  12-2 neg balance, clearing pcxr  12/2- chest pain, cath neg CAD flow limiting  12/2 echo ef wnl, htn lvh, diastlic 1  12/3- weaning well, extubated   Assessment/Plan: Principal Problem:  *Acute respiratory failure with hypoxia Secondary to pulmonary edema/ Hypertensive urgency resolved  Active Problems:  Acute on chronic diastolic HF (heart failure) management per cardiology Continue metoprolol and PO lasix 4.7L negative so far  Health care associated/Ventilator associated pneumonia Continue Levaquin/VAnc, Day 2 of abx so far, check sputum Cx. Note that she is immunocompromised  Dysphagia with heartburn FU Esophagram, speech eval, add PPI , continue  pureed diet for now.   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 demand ischemia Per cardiology   Goiter, chronic   Diabetes mellitus type 2, uncontrolled, with complications Stable, SSI   Multiple sclerosis Cont meds   Morbid obesity   Code Status: full code Family Communication:  Husband in room Disposition Plan:  Cont to follow in tele DVT prophylaxis: Heparin   Consultants:  cards  HPI/Subjective: Laying in bed, still with cough and some congestion, heartburn and difficulty swallowing for 3 weeks now  Objective: Filed Vitals:   05/11/12 0924 05/11/12 1442 05/11/12 2136 05/12/12 0451  BP: 146/57 140/56 153/60 141/69  Pulse: 74 74 63 61  Temp: 98.3 F (36.8 C) 98.9 F (37.2 C) 98.2 F (36.8 C) 98 F (36.7 C)  TempSrc: Oral Oral Oral Oral  Resp: 18 18 18 18   Height:       Weight:    106.6 kg (235 lb 0.2 oz)  SpO2: 98% 97% 98% 100%    Intake/Output Summary (Last 24 hours) at 05/12/12 0756 Last data filed at 05/12/12 0500  Gross per 24 hour  Intake   1490 ml  Output    776 ml  Net    714 ml    Exam:   General:  Alert, no distress  Cardiovascular: RRR, no murmurs  Respiratory: b/l ronchi  Abdomen: soft, NT, ND< BS+  Ext: no c/c/e  Data Reviewed: Basic Metabolic Panel:  Lab 05/12/12 9562 05/10/12 0900 05/10/12 0528 05/09/12 0428 05/08/12 0428 05/06/12 1032  NA 138 140 143 138 143 --  K 3.8 4.1 4.3 3.8 4.3 --  CL 100 103 106 101 105 --  CO2 29 27 27 30 28  --  GLUCOSE 99 151* 136* 148* 129* --  BUN 14 14 16 21  29* --  CREATININE 1.28* 0.98 1.02 1.00 1.27* --  CALCIUM 9.0 9.3 9.2 8.9 8.5 --  MG -- -- -- -- -- 1.9  PHOS -- -- -- -- -- 5.2*   Liver Function Tests:  Lab 05/07/12 1712 05/06/12 0911  AST 23 53*  ALT 29 40*  ALKPHOS 87 116  BILITOT 0.9 0.5  PROT 6.9 7.7  ALBUMIN 2.9* 3.0*   No results found for this basename: LIPASE:5,AMYLASE:5 in the last 168 hours No results found for this basename: AMMONIA:5 in the last 168 hours CBC:  Lab 05/12/12 0500 05/10/12 0900 05/09/12 0428 05/08/12 0428 05/07/12 1700 05/06/12 0911  WBC 11.3* 12.7* 13.4* 18.3* 20.1* --  NEUTROABS -- 8.4* 9.2* 14.3* --  21.9*  HGB 12.3 13.5 12.1 12.8 13.3 --  HCT 38.5 42.1 38.5 40.1 41.1 --  MCV 94.6 94.0 95.5 93.5 93.0 --  PLT 114* 106* 98* 100* 104* --   Cardiac Enzymes:  Lab 05/07/12 0900 05/06/12 2220 05/06/12 1545 05/06/12 1107  CKTOTAL 203* -- -- --  CKMB 6.1* -- -- --  CKMBINDEX -- -- -- --  TROPONINI 1.00* 1.20* 1.41* <0.30   BNP (last 3 results)  Basename 05/09/12 0428  PROBNP 2561.0*   CBG:  Lab 05/12/12 0618 05/11/12 2116 05/11/12 1616 05/11/12 1110 05/11/12 0712  GLUCAP 91 158* 128* 106* 104*    Recent Results (from the past 240 hour(s))  CULTURE, BLOOD (ROUTINE X 2)     Status: Normal (Preliminary result)   Collection Time    05/06/12  9:30 AM      Component Value Range Status Comment   Specimen Description BLOOD LEFT HAND   Final    Special Requests BOTTLES DRAWN AEROBIC ONLY 5CC   Final    Culture  Setup Time 05/06/2012 16:10   Final    Culture     Final    Value:        BLOOD CULTURE RECEIVED NO GROWTH TO DATE CULTURE WILL BE HELD FOR 5 DAYS BEFORE ISSUING A FINAL NEGATIVE REPORT   Report Status PENDING   Incomplete   CULTURE, BLOOD (ROUTINE X 2)     Status: Normal (Preliminary result)   Collection Time   05/06/12  9:35 AM      Component Value Range Status Comment   Specimen Description BLOOD LEFT HAND   Final    Special Requests BOTTLES DRAWN AEROBIC AND ANAEROBIC 10CC   Final    Culture  Setup Time 05/06/2012 16:10   Final    Culture     Final    Value:        BLOOD CULTURE RECEIVED NO GROWTH TO DATE CULTURE WILL BE HELD FOR 5 DAYS BEFORE ISSUING A FINAL NEGATIVE REPORT   Report Status PENDING   Incomplete   URINE CULTURE     Status: Normal   Collection Time   05/06/12  9:43 AM      Component Value Range Status Comment   Specimen Description URINE, CATHETERIZED   Final    Special Requests NONE   Final    Culture  Setup Time 05/06/2012 17:18   Final    Colony Count NO GROWTH   Final    Culture NO GROWTH   Final    Report Status 05/07/2012 FINAL   Final   MRSA PCR SCREENING     Status: Normal   Collection Time   05/06/12  1:39 PM      Component Value Range Status Comment   MRSA by PCR NEGATIVE  NEGATIVE Final   CULTURE, RESPIRATORY     Status: Normal   Collection Time   05/06/12  8:17 PM      Component Value Range Status Comment   Specimen Description TRACHEAL ASPIRATE   Final    Special Requests NONE   Final    Gram Stain     Final    Value: FEW WBC PRESENT, PREDOMINANTLY PMN     NO SQUAMOUS EPITHELIAL CELLS SEEN     RARE GRAM POSITIVE RODS   Culture MODERATE STREPTOCOCCUS,BETA HEMOLYIC NOT GROUP A   Final    Report Status 05/09/2012 FINAL   Final   URINE CULTURE     Status: Normal  Collection Time   05/10/12  6:33 PM      Component Value Range Status Comment   Specimen Description URINE, RANDOM   Final    Special Requests NONE   Final    Culture  Setup Time 05/10/2012 18:58   Final    Colony Count 20,OOO COLONIES/ML   Final    Culture     Final    Value: Multiple bacterial morphotypes present, none predominant. Suggest appropriate recollection if clinically indicated.   Report Status 05/11/2012 FINAL   Final      Studies: Dg Chest Port 1 View  05/10/2012  *RADIOLOGY REPORT*  Clinical Data: Unexplained leukocytosis.  Respiratory failure. Chest pain.  PORTABLE CHEST - 1 VIEW  Comparison: 05/09/2012  Findings: Central venous catheter has been removed.  Pulmonary vascularity is normal.  There is loss of the left hemidiaphragm silhouette laterally consistent with an area of infiltrate or atelectasis.  The overall heart size is unchanged.  IMPRESSION: Focal area of infiltrate or atelectasis at the left base laterally, new since the prior exam.   Original Report Authenticated By: Francene Boyers, M.D.    Dg Chest Port 1 View  05/09/2012  *RADIOLOGY REPORT*  Clinical Data: Edema, shortness of breath.  PORTABLE CHEST - 1 VIEW  Comparison: 05/08/2012.  Findings: Trachea is midline.  Heart size stable.  Right IJ central line tip projects over the SVC.  Nasogastric tube and endotracheal tube have been removed in the interval.  Bibasilar dependent air space disease persists.  Probable small bilateral pleural effusions.  IMPRESSION: Persistent mild congestive heart failure.   Original Report Authenticated By: Leanna Battles, M.D.    Dg Chest Port 1 View  05/08/2012  *RADIOLOGY REPORT*  Clinical Data: Evaluate endotracheal tube  PORTABLE CHEST - 1 VIEW  Comparison: 05/07/2012; 05/06/2012; 10/19/2011; chest CT - 10/19/2011  Findings: Grossly unchanged borderline enlarged cardiac silhouette and mediastinal contours.  Stable position of support apparatus. No pneumothorax.  Improved aeration of  the bilateral lung bases with persistent bibasilar opacities, left greater than right. Pulmonary vasculature is more distinct on the present examination. There is persistent thickening along the right minor fissure. Trace bilateral effusions are suspected, unchanged.  Unchanged bones including right rotator cuff repair.  IMPRESSION:  1.  Stable positioning of support apparatus.  No pneumothorax. 2.  Overall findings most suggestive of improving pulmonary edema and atelectasis.   Original Report Authenticated By: Tacey Ruiz, MD    Portable Chest Xray In Am  05/07/2012  *RADIOLOGY REPORT*  Clinical Data: Check endotracheal tube position.  PORTABLE CHEST - 1 VIEW  Comparison: 05/06/2012 and CT chest 10/19/2011.  Findings: Endotracheal tube is in satisfactory position. Nasogastric tube is followed into the stomach.  Right IJ central line tip projects over the SVC.  Heart size stable.  Moderate diffuse bilateral air space disease, bibasilar dependent, with slight interval improved aeration bilaterally.  No definite pleural fluid.  IMPRESSION: Slight interval improvement in diffuse bilateral air space disease, which may be due to edema.   Original Report Authenticated By: Leanna Battles, M.D.    Dg Chest Portable 1 View  05/06/2012  *RADIOLOGY REPORT*  Clinical Data: Central venous catheter placement bedside.  PORTABLE CHEST - 1 VIEW 05/06/2012 1118 hours:  Comparison: Portable chest x-ray earlier same day 0919 hours.  Findings: Right jugular central venous catheter tip projects over the SVC (patient rotated to the left).  Cardiomegaly and dense airspace consolidation throughout both lungs, unchanged.  No evidence of pneumothorax mediastinal hematoma.  Endotracheal tube tip remains in satisfactory position approximately 4 cm above the carina.  Nasogastric tube courses below the diaphragm.  IMPRESSION:  1.  Right jugular central venous catheter tip projects over the SVC.  No acute complicating features. 2.   Remaining support apparatus satisfactory. 3.  Stable severe bilateral pneumonia/edema/ARDS.   Original Report Authenticated By: Hulan Saas, M.D.    Dg Chest Portable 1 View  05/06/2012  *RADIOLOGY REPORT*  Clinical Data: Short of breath  PORTABLE CHEST - 1 VIEW  Comparison: Prior chest x-ray and CT scan of the chest 10/19/2011  Findings: The patient is intubated.  The carina is difficult to identify, the tip of the endotracheal tube is likely just above the carina.  A nasogastric tube is in place, the tip lies below the diaphragm off the field of view.  Diffuse bilateral interstitial opacities with confluent airspace disease in the bilateral lower lobes.  Cardiomegaly appears slightly progressed compared to the prior study.  No large pleural effusion or pneumothorax identified.  IMPRESSION:  1.  Interval development of diffuse bilateral interstitial opacities with more confluent airspace disease in the bilateral lower lobes.  In the setting of slightly progressed cardiomegaly, findings are most consistent with congestive heart failure and acute  pulmonary edema.  Additional differential considerations include atypical infection, multilobar pneumonia, and in the appropriate clinical setting - aspiration.  2.  Interval intubation.  The tip of the endotracheal tube is low, likely just above the carina.  Recommend withdrawing 2 cm for more optimal placement.   Original Report Authenticated By: Malachy Moan, M.D.     Scheduled Meds:    . amLODipine  10 mg Oral Daily  . furosemide  40 mg Oral BID  . heparin  5,000 Units Subcutaneous Q8H  . hydrALAZINE  50 mg Oral BID  . insulin aspart  2-6 Units Subcutaneous TID WC  . insulin glargine  25 Units Subcutaneous QHS  . interferon beta-1a  30 mcg Intramuscular Once  . levofloxacin (LEVAQUIN) IV  750 mg Intravenous Q24H  . losartan  100 mg Oral Daily  . metFORMIN  1,000 mg Oral BID WC  . metoprolol tartrate  75 mg Oral BID  . traZODone  100 mg Oral  QHS  . [COMPLETED] vancomycin  2,000 mg Intravenous Once  . vancomycin  1,000 mg Intravenous Q12H  . [DISCONTINUED] furosemide  40 mg Oral Daily  . [DISCONTINUED] hydrALAZINE  50 mg Oral Q8H  . [DISCONTINUED] losartan  50 mg Oral Daily   Continuous Infusions:   ________________________________________________________________________  Time spent: 35 min    Nakesha Ebrahim  [facility] Pager [phone] If 8PM-8AM, please contact night-coverage at [email or web address], password [facility] 05/12/2012, 7:56 AM  LOS: 6 days

## 2012-05-13 ENCOUNTER — Inpatient Hospital Stay (HOSPITAL_COMMUNITY): Payer: 59

## 2012-05-13 DIAGNOSIS — R1310 Dysphagia, unspecified: Secondary | ICD-10-CM | POA: Diagnosis present

## 2012-05-13 LAB — BASIC METABOLIC PANEL
BUN: 14 mg/dL (ref 6–23)
CO2: 27 mEq/L (ref 19–32)
Calcium: 8.9 mg/dL (ref 8.4–10.5)
Chloride: 98 mEq/L (ref 96–112)
Creatinine, Ser: 1.37 mg/dL — ABNORMAL HIGH (ref 0.50–1.10)
GFR calc Af Amer: 48 mL/min — ABNORMAL LOW (ref >90)
GFR calc non Af Amer: 41 mL/min — ABNORMAL LOW (ref >90)
Glucose, Bld: 122 mg/dL — ABNORMAL HIGH (ref 70–99)
Potassium: 3.5 mEq/L (ref 3.5–5.1)
Sodium: 136 mEq/L (ref 135–145)

## 2012-05-13 LAB — MAGNESIUM: Magnesium: 1.8 mg/dL (ref 1.5–2.5)

## 2012-05-13 LAB — GLUCOSE, CAPILLARY
Glucose-Capillary: 107 mg/dL — ABNORMAL HIGH (ref 70–99)
Glucose-Capillary: 103 mg/dL — ABNORMAL HIGH (ref 70–99)
Glucose-Capillary: 102 mg/dL — ABNORMAL HIGH (ref 70–99)

## 2012-05-13 MED ORDER — HEPARIN SODIUM (PORCINE) 5000 UNIT/ML IJ SOLN
5000.0000 [IU] | Freq: Two times a day (BID) | INTRAMUSCULAR | Status: DC
  Administered 2012-05-13 – 2012-05-15 (×4): 5000 [IU] via SUBCUTANEOUS
  Filled 2012-05-13 (×6): qty 1

## 2012-05-13 MED ORDER — POTASSIUM CHLORIDE CRYS ER 20 MEQ PO TBCR
20.0000 meq | EXTENDED_RELEASE_TABLET | Freq: Once | ORAL | Status: AC
  Administered 2012-05-13: 20 meq via ORAL
  Filled 2012-05-13: qty 1

## 2012-05-13 MED ORDER — FUROSEMIDE 40 MG PO TABS
40.0000 mg | ORAL_TABLET | Freq: Every day | ORAL | Status: DC
  Administered 2012-05-13 – 2012-05-15 (×3): 40 mg via ORAL
  Filled 2012-05-13 (×2): qty 1

## 2012-05-13 MED ORDER — LEVOFLOXACIN 500 MG PO TABS
500.0000 mg | ORAL_TABLET | ORAL | Status: DC
  Administered 2012-05-13 – 2012-05-14 (×2): 500 mg via ORAL
  Filled 2012-05-13 (×5): qty 1

## 2012-05-13 MED ORDER — ONDANSETRON HCL 4 MG PO TABS
4.0000 mg | ORAL_TABLET | Freq: Four times a day (QID) | ORAL | Status: DC | PRN
  Administered 2012-05-13: 4 mg via ORAL
  Filled 2012-05-13: qty 1

## 2012-05-13 NOTE — Progress Notes (Addendum)
[facility] PROGRESS NOTE PCCM transfer  [name] Fry WUJ:[id number] DOB: [birth year] DOA: 05/06/2012 PCP: Judene Companion, MD  Brief narrative: 61 yo [place] AAF with multiple medical problems including MS/HTN/, smoker here with acute resp failure, secondary to pulm edema, HTN.  SIGNIFICANT EVENTS:  12-1 intubated  12-2 neg balance, clearing pcxr  12/2- chest pain, cath neg CAD flow limiting  12/2 echo ef wnl, htn lvh, diastlic 1  12/3- weaning well, extubated   Assessment/Plan: Principal Problem:  *Acute respiratory failure with hypoxia Secondary to pulmonary edema/ Hypertensive urgency resolved  Active Problems:  Acute on chronic diastolic HF (heart failure) management per cardiology Continue metoprolol and PO lasix 6.5L negative so far  Health care associated/Ventilator associated pneumonia On Levaquin/VAnc, Day 3 of abx so far, check sputum Cx. DC Vanc, change levaquin to PO  Dysphagia with heartburn FU Esophagram, speech eval, add PPI , continue  pureed diet for now.   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 demand ischemia Per cardiology   Goiter, chronic   Diabetes mellitus type 2, uncontrolled, with complications Stable, SSI   Multiple sclerosis Cont meds   Morbid obesity   Code Status: full code Family Communication:  Husband in room Disposition Plan:  Cont to follow in tele DVT prophylaxis: Heparin   Consultants:  cards  HPI/Subjective: Laying in bed, still with heartburn and diff swallowing, dyspneic when lies down flat  Objective: Filed Vitals:   05/12/12 1050 05/12/12 1300 05/12/12 2138 05/13/12 0548  BP: 133/57 110/43 130/80 109/71  Pulse: 62 63  62  Temp:  98.1 F (36.7 C)  98.1 F (36.7 C)  TempSrc:  Oral  Oral  Resp:  18  18  Height:      Weight:    105 kg (231 lb 7.7 oz)  SpO2:  99%  100%    Intake/Output  Summary (Last 24 hours) at 05/13/12 0906 Last data filed at 05/13/12 0550  Gross per 24 hour  Intake    640 ml  Output   2550 ml  Net  -1910 ml    Exam:   General:  Alert, no distress  Cardiovascular: RRR, no murmurs  Respiratory: b/l ronchi  Abdomen: soft, NT, ND< BS+  Ext: no c/c/e  Data Reviewed: Basic Metabolic Panel:  Lab 05/12/12 9562 05/10/12 0900 05/10/12 0528 05/09/12 0428 05/08/12 0428 05/06/12 1032  NA 138 140 143 138 143 --  K 3.8 4.1 4.3 3.8 4.3 --  CL 100 103 106 101 105 --  CO2 29 27 27 30 28  --  GLUCOSE 99 151* 136* 148* 129* --  BUN 14 14 16 21  29* --  CREATININE 1.28* 0.98 1.02 1.00 1.27* --  CALCIUM 9.0 9.3 9.2 8.9 8.5 --  MG -- -- -- -- -- 1.9  PHOS -- -- -- -- -- 5.2*   Liver Function Tests:  Lab 05/07/12 1712 05/06/12 0911  AST 23 53*  ALT 29 40*  ALKPHOS 87 116  BILITOT 0.9 0.5  PROT 6.9 7.7  ALBUMIN 2.9* 3.0*   No results found for this basename: LIPASE:5,AMYLASE:5 in the last 168 hours No results found for this basename: AMMONIA:5 in the last 168 hours CBC:  Lab 05/12/12 0500 05/10/12 0900 05/09/12 0428 05/08/12 0428 05/07/12 1700 05/06/12 0911  WBC 11.3* 12.7* 13.4* 18.3* 20.1* --  NEUTROABS -- 8.4* 9.2* 14.3* -- 21.9*  HGB 12.3 13.5 12.1 12.8 13.3 --  HCT 38.5 42.1 38.5 40.1 41.1 --  MCV 94.6 94.0 95.5 93.5 93.0 --  PLT 114* 106* 98* 100* 104* --   Cardiac Enzymes:  Lab 05/07/12 0900 05/06/12 2220 05/06/12 1545 05/06/12 1107  CKTOTAL 203* -- -- --  CKMB 6.1* -- -- --  CKMBINDEX -- -- -- --  TROPONINI 1.00* 1.20* 1.41* <0.30   BNP (last 3 results)  Basename 05/09/12 0428  PROBNP 2561.0*   CBG:  Lab 05/13/12 0607 05/12/12 2122 05/12/12 1635 05/12/12 1115 05/12/12 0618  GLUCAP 107* 109* 104* 104* 91    Recent Results (from the past 240 hour(s))  CULTURE, BLOOD (ROUTINE X 2)     Status: Normal   Collection Time   05/06/12  9:30 AM      Component Value Range Status Comment   Specimen Description BLOOD LEFT HAND    Final    Special Requests BOTTLES DRAWN AEROBIC ONLY 5CC   Final    Culture  Setup Time 05/06/2012 16:10   Final    Culture NO GROWTH 5 DAYS   Final    Report Status 05/12/2012 FINAL   Final   CULTURE, BLOOD (ROUTINE X 2)     Status: Normal   Collection Time   05/06/12  9:35 AM      Component Value Range Status Comment   Specimen Description BLOOD LEFT HAND   Final    Special Requests BOTTLES DRAWN AEROBIC AND ANAEROBIC 10CC   Final    Culture  Setup Time 05/06/2012 16:10   Final    Culture NO GROWTH 5 DAYS   Final    Report Status 05/12/2012 FINAL   Final   URINE CULTURE     Status: Normal   Collection Time   05/06/12  9:43 AM      Component Value Range Status Comment   Specimen Description URINE, CATHETERIZED   Final    Special Requests NONE   Final    Culture  Setup Time 05/06/2012 17:18   Final    Colony Count NO GROWTH   Final    Culture NO GROWTH   Final    Report Status 05/07/2012 FINAL   Final   MRSA PCR SCREENING     Status: Normal   Collection Time   05/06/12  1:39 PM      Component Value Range Status Comment   MRSA by PCR NEGATIVE  NEGATIVE Final   CULTURE, RESPIRATORY     Status: Normal   Collection Time   05/06/12  8:17 PM      Component Value Range Status Comment   Specimen Description TRACHEAL ASPIRATE   Final    Special Requests NONE   Final    Gram Stain     Final    Value: FEW WBC PRESENT, PREDOMINANTLY PMN     NO SQUAMOUS EPITHELIAL CELLS SEEN     RARE GRAM POSITIVE RODS   Culture MODERATE STREPTOCOCCUS,BETA HEMOLYIC NOT GROUP A   Final    Report Status 05/09/2012 FINAL   Final   URINE CULTURE     Status: Normal   Collection Time   05/10/12  6:33 PM      Component Value Range Status Comment   Specimen Description URINE, RANDOM   Final    Special Requests NONE   Final    Culture  Setup Time 05/10/2012 18:58   Final    Colony Count 20,OOO COLONIES/ML   Final    Culture     Final  Value: Multiple bacterial morphotypes present, none predominant. Suggest  appropriate recollection if clinically indicated.   Report Status 05/11/2012 FINAL   Final      Studies: Dg Chest Port 1 View  05/10/2012  *RADIOLOGY REPORT*  Clinical Data: Unexplained leukocytosis.  Respiratory failure. Chest pain.  PORTABLE CHEST - 1 VIEW  Comparison: 05/09/2012  Findings: Central venous catheter has been removed.  Pulmonary vascularity is normal.  There is loss of the left hemidiaphragm silhouette laterally consistent with an area of infiltrate or atelectasis.  The overall heart size is unchanged.  IMPRESSION: Focal area of infiltrate or atelectasis at the left base laterally, new since the prior exam.   Original Report Authenticated By: Francene Boyers, M.D.    Dg Chest Port 1 View  05/09/2012  *RADIOLOGY REPORT*  Clinical Data: Edema, shortness of breath.  PORTABLE CHEST - 1 VIEW  Comparison: 05/08/2012.  Findings: Trachea is midline.  Heart size stable.  Right IJ central line tip projects over the SVC.  Nasogastric tube and endotracheal tube have been removed in the interval.  Bibasilar dependent air space disease persists.  Probable small bilateral pleural effusions.  IMPRESSION: Persistent mild congestive heart failure.   Original Report Authenticated By: Leanna Battles, M.D.    Dg Chest Port 1 View  05/08/2012  *RADIOLOGY REPORT*  Clinical Data: Evaluate endotracheal tube  PORTABLE CHEST - 1 VIEW  Comparison: 05/07/2012; 05/06/2012; 10/19/2011; chest CT - 10/19/2011  Findings: Grossly unchanged borderline enlarged cardiac silhouette and mediastinal contours.  Stable position of support apparatus. No pneumothorax.  Improved aeration of the bilateral lung bases with persistent bibasilar opacities, left greater than right. Pulmonary vasculature is more distinct on the present examination. There is persistent thickening along the right minor fissure. Trace bilateral effusions are suspected, unchanged.  Unchanged bones including right rotator cuff repair.  IMPRESSION:  1.  Stable  positioning of support apparatus.  No pneumothorax. 2.  Overall findings most suggestive of improving pulmonary edema and atelectasis.   Original Report Authenticated By: Tacey Ruiz, MD    Portable Chest Xray In Am  05/07/2012  *RADIOLOGY REPORT*  Clinical Data: Check endotracheal tube position.  PORTABLE CHEST - 1 VIEW  Comparison: 05/06/2012 and CT chest 10/19/2011.  Findings: Endotracheal tube is in satisfactory position. Nasogastric tube is followed into the stomach.  Right IJ central line tip projects over the SVC.  Heart size stable.  Moderate diffuse bilateral air space disease, bibasilar dependent, with slight interval improved aeration bilaterally.  No definite pleural fluid.  IMPRESSION: Slight interval improvement in diffuse bilateral air space disease, which may be due to edema.   Original Report Authenticated By: Leanna Battles, M.D.    Dg Chest Portable 1 View  05/06/2012  *RADIOLOGY REPORT*  Clinical Data: Central venous catheter placement bedside.  PORTABLE CHEST - 1 VIEW 05/06/2012 1118 hours:  Comparison: Portable chest x-ray earlier same day 0919 hours.  Findings: Right jugular central venous catheter tip projects over the SVC (patient rotated to the left).  Cardiomegaly and dense airspace consolidation throughout both lungs, unchanged.  No evidence of pneumothorax mediastinal hematoma.  Endotracheal tube tip remains in satisfactory position approximately 4 cm above the carina.  Nasogastric tube courses below the diaphragm.  IMPRESSION:  1.  Right jugular central venous catheter tip projects over the SVC.  No acute complicating features. 2.  Remaining support apparatus satisfactory. 3.  Stable severe bilateral pneumonia/edema/ARDS.   Original Report Authenticated By: Hulan Saas, M.D.    Dg Chest Portable 1  View  05/06/2012  *RADIOLOGY REPORT*  Clinical Data: Short of breath  PORTABLE CHEST - 1 VIEW  Comparison: Prior chest x-ray and CT scan of the chest 10/19/2011  Findings: The  patient is intubated.  The carina is difficult to identify, the tip of the endotracheal tube is likely just above the carina.  A nasogastric tube is in place, the tip lies below the diaphragm off the field of view.  Diffuse bilateral interstitial opacities with confluent airspace disease in the bilateral lower lobes.  Cardiomegaly appears slightly progressed compared to the prior study.  No large pleural effusion or pneumothorax identified.  IMPRESSION:  1.  Interval development of diffuse bilateral interstitial opacities with more confluent airspace disease in the bilateral lower lobes.  In the setting of slightly progressed cardiomegaly, findings are most consistent with congestive heart failure and acute  pulmonary edema.  Additional differential considerations include atypical infection, multilobar pneumonia, and in the appropriate clinical setting - aspiration.  2.  Interval intubation.  The tip of the endotracheal tube is low, likely just above the carina.  Recommend withdrawing 2 cm for more optimal placement.   Original Report Authenticated By: Malachy Moan, M.D.     Scheduled Meds:    . amLODipine  10 mg Oral Daily  . furosemide  40 mg Oral Daily  . heparin  5,000 Units Subcutaneous Q8H  . insulin aspart  2-6 Units Subcutaneous TID WC  . insulin glargine  25 Units Subcutaneous QHS  . interferon beta-1a  30 mcg Intramuscular Once  . [COMPLETED] interferon beta-1a  30 mcg Intramuscular Once  . levofloxacin  500 mg Oral Q24H  . metFORMIN  1,000 mg Oral BID WC  . metoprolol tartrate  75 mg Oral BID  . pantoprazole  40 mg Oral BID AC  . traZODone  100 mg Oral QHS  . [DISCONTINUED] furosemide  40 mg Oral BID  . [DISCONTINUED] hydrALAZINE  50 mg Oral BID  . [DISCONTINUED] levofloxacin (LEVAQUIN) IV  750 mg Intravenous Q24H  . [DISCONTINUED] levofloxacin  750 mg Oral Q24H  . [DISCONTINUED] losartan  100 mg Oral Daily  . [DISCONTINUED] vancomycin  1,000 mg Intravenous Q12H   Continuous  Infusions:   ________________________________________________________________________  Time spent: 35 min    Venezia Sargeant  [facility] Pager [phone] If 8PM-8AM, please contact night-coverage at [email or web address], password [facility] 05/13/2012, 9:06 AM  LOS: 7 days

## 2012-05-13 NOTE — Progress Notes (Signed)
Patient had a 5 beat run of v-tach. Patient was asymptomatic at the time. No signs or symptoms of distress or discomfort. BP is 120/48 and HR is 64. MD notified and new orders was given. Will continue to monitor patient for further changes in condition.

## 2012-05-14 LAB — BASIC METABOLIC PANEL
Calcium: 8.9 mg/dL (ref 8.4–10.5)
GFR calc non Af Amer: 39 mL/min — ABNORMAL LOW (ref >90)
Potassium: 3.6 mEq/L (ref 3.5–5.1)
Sodium: 137 mEq/L (ref 135–145)

## 2012-05-14 LAB — GLUCOSE, CAPILLARY

## 2012-05-14 LAB — PROCALCITONIN: Procalcitonin: 0.16 ng/mL

## 2012-05-14 MED ORDER — SUCRALFATE 1 GM/10ML PO SUSP
1.0000 g | Freq: Three times a day (TID) | ORAL | Status: DC
  Administered 2012-05-14 – 2012-05-15 (×5): 1 g via ORAL
  Filled 2012-05-14 (×8): qty 10

## 2012-05-14 MED ORDER — INSULIN GLARGINE 100 UNIT/ML ~~LOC~~ SOLN
20.0000 [IU] | Freq: Every day | SUBCUTANEOUS | Status: DC
  Administered 2012-05-14: 20 [IU] via SUBCUTANEOUS

## 2012-05-14 NOTE — Progress Notes (Signed)
[facility] PROGRESS NOTE PCCM transfer  TERRACE CHIEM WUJ:[id number] DOB: 1950-09-14 DOA: 05/06/2012 PCP: Judene Companion, MD  Brief narrative: 61 yo [place] AAF with multiple medical problems including MS/HTN/, smoker here with acute resp failure, secondary to pulm edema, HTN.  SIGNIFICANT EVENTS:  12-1 intubated  12-2 neg balance, clearing pcxr  12/2- chest pain, cath neg CAD flow limiting  12/2 echo ef wnl, htn lvh, diastlic 1  12/3- weaning well, extubated   Assessment/Plan: Principal Problem: 1.  *Acute respiratory failure with hypoxia Secondary to pulmonary edema/ Hypertensive urgency resolved  2.  Acute on chronic diastolic HF (heart failure) Cards signed off,  Continue metoprolol and PO lasix 7.2L negative so far  3. Health care associated/Ventilator associated pneumonia On Levaquin/VAnc, Day 5 of abx so far, check sputum Cx. DC Vanc, continue PO  levaquin  4. Dysphagia with heartburn Esophagram with GERD, continue PPI BID Advance diet, add carafate, Fu with Gi after medical management if symptoms dont resolve  5. HTN /Hypertensive hypertrophic cardiomyopathy, by echo 05/07/12  cont metoprolol, hydralazine, DC losartan  6. ELevated cardiac enzymes/- Troponin 1.4: due to demand ischemia Normal coronary arteries by cath 2008 & 12/ 2013; Normal EF; LVH on Echo   7.  Goiter, chronic  8.  Diabetes mellitus type 2, uncontrolled, with complications Stable, cut down lantus, metformin, SSI  9.  Multiple sclerosis: stable   Morbid obesity   Code Status: full code Family Communication:  Discussed with patient  Disposition Plan:  Home tomorrow if stable DVT prophylaxis: Heparin   Consultants:  cards  HPI/Subjective: Sitting in chair getting ready to eat breakfast, still with heartburn, but able to eat better, dyspnea improving  Objective: Filed Vitals:   05/14/12 0530 05/14/12 1030 05/14/12 1359 05/14/12 1410  BP: 128/52 116/64 136/57   Pulse: 50 55  53 52  Temp: 97.4 F (36.3 C) 98.1 F (36.7 C) 98.2 F (36.8 C)   TempSrc: Oral Oral Oral   Resp: 18 18 18    Height:      Weight: 104.327 kg (230 lb)     SpO2: 100% 99% 98%     Intake/Output Summary (Last 24 hours) at 05/14/12 1418 Last data filed at 05/14/12 9562  Gross per 24 hour  Intake    900 ml  Output   1350 ml  Net   -450 ml    Exam:   General:  Alert, no distress  Cardiovascular: RRR, no murmurs  Respiratory: b/l ronchi  Abdomen: soft, NT, ND< BS+  Ext: no c/c/e  Data Reviewed: Basic Metabolic Panel:  Lab 05/14/12 1308 05/13/12 1916 05/13/12 0920 05/12/12 0500 05/10/12 0900 05/10/12 0528  NA 137 -- 136 138 140 143  K 3.6 -- 3.5 3.8 4.1 4.3  CL 100 -- 98 100 103 106  CO2 29 -- 27 29 27 27   GLUCOSE 101* -- 122* 99 151* 136*  BUN 13 -- 14 14 14 16   CREATININE 1.42* -- 1.37* 1.28* 0.98 1.02  CALCIUM 8.9 -- 8.9 9.0 9.3 9.2  MG -- 1.8 -- -- -- --  PHOS -- -- -- -- -- --   Liver Function Tests:  Lab 05/07/12 1712  AST 23  ALT 29  ALKPHOS 87  BILITOT 0.9  PROT 6.9  ALBUMIN 2.9*   No results found for this basename: LIPASE:5,AMYLASE:5 in the last 168 hours No results found for this basename: AMMONIA:5 in the last 168 hours CBC:  Lab 05/12/12 0500 05/10/12 0900 05/09/12 0428 05/08/12 0428  05/07/12 1700  WBC 11.3* 12.7* 13.4* 18.3* 20.1*  NEUTROABS -- 8.4* 9.2* 14.3* --  HGB 12.3 13.5 12.1 12.8 13.3  HCT 38.5 42.1 38.5 40.1 41.1  MCV 94.6 94.0 95.5 93.5 93.0  PLT 114* 106* 98* 100* 104*   Cardiac Enzymes: No results found for this basename: CKTOTAL:5,CKMB:5,CKMBINDEX:5,TROPONINI:5 in the last 168 hours BNP (last 3 results)  Basename 05/09/12 0428  PROBNP 2561.0*   CBG:  Lab 05/14/12 1137 05/14/12 0634 05/14/12 0408 05/13/12 2133 05/13/12 1658  GLUCAP 115* 117* 103* 89 102*    Recent Results (from the past 240 hour(s))  CULTURE, BLOOD (ROUTINE X 2)     Status: Normal   Collection Time   05/06/12  9:30 AM      Component Value Range  Status Comment   Specimen Description BLOOD LEFT HAND   Final    Special Requests BOTTLES DRAWN AEROBIC ONLY 5CC   Final    Culture  Setup Time 05/06/2012 16:10   Final    Culture NO GROWTH 5 DAYS   Final    Report Status 05/12/2012 FINAL   Final   CULTURE, BLOOD (ROUTINE X 2)     Status: Normal   Collection Time   05/06/12  9:35 AM      Component Value Range Status Comment   Specimen Description BLOOD LEFT HAND   Final    Special Requests BOTTLES DRAWN AEROBIC AND ANAEROBIC 10CC   Final    Culture  Setup Time 05/06/2012 16:10   Final    Culture NO GROWTH 5 DAYS   Final    Report Status 05/12/2012 FINAL   Final   URINE CULTURE     Status: Normal   Collection Time   05/06/12  9:43 AM      Component Value Range Status Comment   Specimen Description URINE, CATHETERIZED   Final    Special Requests NONE   Final    Culture  Setup Time 05/06/2012 17:18   Final    Colony Count NO GROWTH   Final    Culture NO GROWTH   Final    Report Status 05/07/2012 FINAL   Final   MRSA PCR SCREENING     Status: Normal   Collection Time   05/06/12  1:39 PM      Component Value Range Status Comment   MRSA by PCR NEGATIVE  NEGATIVE Final   CULTURE, RESPIRATORY     Status: Normal   Collection Time   05/06/12  8:17 PM      Component Value Range Status Comment   Specimen Description TRACHEAL ASPIRATE   Final    Special Requests NONE   Final    Gram Stain     Final    Value: FEW WBC PRESENT, PREDOMINANTLY PMN     NO SQUAMOUS EPITHELIAL CELLS SEEN     RARE GRAM POSITIVE RODS   Culture MODERATE STREPTOCOCCUS,BETA HEMOLYIC NOT GROUP A   Final    Report Status 05/09/2012 FINAL   Final   URINE CULTURE     Status: Normal   Collection Time   05/10/12  6:33 PM      Component Value Range Status Comment   Specimen Description URINE, RANDOM   Final    Special Requests NONE   Final    Culture  Setup Time 05/10/2012 18:58   Final    Colony Count 20,OOO COLONIES/ML   Final    Culture     Final    Value:  Multiple  bacterial morphotypes present, none predominant. Suggest appropriate recollection if clinically indicated.   Report Status 05/11/2012 FINAL   Final      Studies: Dg Chest Port 1 View  05/10/2012  *RADIOLOGY REPORT*  Clinical Data: Unexplained leukocytosis.  Respiratory failure. Chest pain.  PORTABLE CHEST - 1 VIEW  Comparison: 05/09/2012  Findings: Central venous catheter has been removed.  Pulmonary vascularity is normal.  There is loss of the left hemidiaphragm silhouette laterally consistent with an area of infiltrate or atelectasis.  The overall heart size is unchanged.  IMPRESSION: Focal area of infiltrate or atelectasis at the left base laterally, new since the prior exam.   Original Report Authenticated By: Francene Boyers, M.D.    Dg Chest Port 1 View  05/09/2012  *RADIOLOGY REPORT*  Clinical Data: Edema, shortness of breath.  PORTABLE CHEST - 1 VIEW  Comparison: 05/08/2012.  Findings: Trachea is midline.  Heart size stable.  Right IJ central line tip projects over the SVC.  Nasogastric tube and endotracheal tube have been removed in the interval.  Bibasilar dependent air space disease persists.  Probable small bilateral pleural effusions.  IMPRESSION: Persistent mild congestive heart failure.   Original Report Authenticated By: Leanna Battles, M.D.    Dg Chest Port 1 View  05/08/2012  *RADIOLOGY REPORT*  Clinical Data: Evaluate endotracheal tube  PORTABLE CHEST - 1 VIEW  Comparison: 05/07/2012; 05/06/2012; 10/19/2011; chest CT - 10/19/2011  Findings: Grossly unchanged borderline enlarged cardiac silhouette and mediastinal contours.  Stable position of support apparatus. No pneumothorax.  Improved aeration of the bilateral lung bases with persistent bibasilar opacities, left greater than right. Pulmonary vasculature is more distinct on the present examination. There is persistent thickening along the right minor fissure. Trace bilateral effusions are suspected, unchanged.  Unchanged bones including  right rotator cuff repair.  IMPRESSION:  1.  Stable positioning of support apparatus.  No pneumothorax. 2.  Overall findings most suggestive of improving pulmonary edema and atelectasis.   Original Report Authenticated By: Tacey Ruiz, MD    Portable Chest Xray In Am  05/07/2012  *RADIOLOGY REPORT*  Clinical Data: Check endotracheal tube position.  PORTABLE CHEST - 1 VIEW  Comparison: 05/06/2012 and CT chest 10/19/2011.  Findings: Endotracheal tube is in satisfactory position. Nasogastric tube is followed into the stomach.  Right IJ central line tip projects over the SVC.  Heart size stable.  Moderate diffuse bilateral air space disease, bibasilar dependent, with slight interval improved aeration bilaterally.  No definite pleural fluid.  IMPRESSION: Slight interval improvement in diffuse bilateral air space disease, which may be due to edema.   Original Report Authenticated By: Leanna Battles, M.D.    Dg Chest Portable 1 View  05/06/2012  *RADIOLOGY REPORT*  Clinical Data: Central venous catheter placement bedside.  PORTABLE CHEST - 1 VIEW 05/06/2012 1118 hours:  Comparison: Portable chest x-ray earlier same day 0919 hours.  Findings: Right jugular central venous catheter tip projects over the SVC (patient rotated to the left).  Cardiomegaly and dense airspace consolidation throughout both lungs, unchanged.  No evidence of pneumothorax mediastinal hematoma.  Endotracheal tube tip remains in satisfactory position approximately 4 cm above the carina.  Nasogastric tube courses below the diaphragm.  IMPRESSION:  1.  Right jugular central venous catheter tip projects over the SVC.  No acute complicating features. 2.  Remaining support apparatus satisfactory. 3.  Stable severe bilateral pneumonia/edema/ARDS.   Original Report Authenticated By: Hulan Saas, M.D.    Dg Chest Portable 1 View  05/06/2012  *RADIOLOGY REPORT*  Clinical Data: Short of breath  PORTABLE CHEST - 1 VIEW  Comparison: Prior chest x-ray  and CT scan of the chest 10/19/2011  Findings: The patient is intubated.  The carina is difficult to identify, the tip of the endotracheal tube is likely just above the carina.  A nasogastric tube is in place, the tip lies below the diaphragm off the field of view.  Diffuse bilateral interstitial opacities with confluent airspace disease in the bilateral lower lobes.  Cardiomegaly appears slightly progressed compared to the prior study.  No large pleural effusion or pneumothorax identified.  IMPRESSION:  1.  Interval development of diffuse bilateral interstitial opacities with more confluent airspace disease in the bilateral lower lobes.  In the setting of slightly progressed cardiomegaly, findings are most consistent with congestive heart failure and acute  pulmonary edema.  Additional differential considerations include atypical infection, multilobar pneumonia, and in the appropriate clinical setting - aspiration.  2.  Interval intubation.  The tip of the endotracheal tube is low, likely just above the carina.  Recommend withdrawing 2 cm for more optimal placement.   Original Report Authenticated By: Malachy Moan, M.D.     Scheduled Meds:    . amLODipine  10 mg Oral Daily  . furosemide  40 mg Oral Daily  . heparin  5,000 Units Subcutaneous Q12H  . insulin aspart  2-6 Units Subcutaneous TID WC  . insulin glargine  20 Units Subcutaneous QHS  . interferon beta-1a  30 mcg Intramuscular Once  . levofloxacin  500 mg Oral Q24H  . metFORMIN  1,000 mg Oral BID WC  . metoprolol tartrate  75 mg Oral BID  . pantoprazole  40 mg Oral BID AC  . [COMPLETED] potassium chloride  20 mEq Oral Once  . sucralfate  1 g Oral TID WC & HS  . traZODone  100 mg Oral QHS  . [DISCONTINUED] insulin glargine  25 Units Subcutaneous QHS   Continuous Infusions:   ________________________________________________________________________  Time spent: 35 min    Tadeo Besecker  [facility] Pager [phone] If  8PM-8AM, please contact night-coverage at [email or web address], password [facility] 05/14/2012, 2:18 PM  LOS: 8 days

## 2012-05-14 NOTE — Progress Notes (Signed)
Md aware pT having nausea after dinner, zofran giving. Pt stated " i almost lost lunch also". Diet advance earlier in the day. Will continue to monitor

## 2012-05-14 NOTE — Progress Notes (Signed)
Utilization review completed.  

## 2012-05-14 NOTE — Evaluation (Signed)
Seen and agree with SPT note Mintie Witherington Tabor Alfard Cochrane, PT [phone]  

## 2012-05-14 NOTE — Evaluation (Signed)
Physical Therapy Evaluation Patient Details Name: [name] Fry MRN: [id number] DOB: [birth year] Today's Date: 05/14/2012 Time: 2130-8657 PT Time Calculation (min): 18 min  PT Assessment / Plan / Recommendation Clinical Impression  Pt. admitted s/p NSTEMI, pulmonary edema, and acute respiratory failure, h/o of CHF and MS; Pt. would benefit from acute PT to address safe  mobility and decreased activity tolerance so pt. may return home with husband. Pt. ambulates with RW at home. Pt. stated that when ascending/descending steps at home she needs her husband and sister-in-law to assist her. Pt. had HH PT after last hospital admission and had nursing coming to make sure she was compliant with her medication.    PT Assessment  Patient needs continued PT services    Follow Up Recommendations  Home health PT       Barriers to Discharge None      Equipment Recommendations  None recommended by PT       Frequency Min 3X/week    Precautions / Restrictions Precautions Precautions: Fall Restrictions Weight Bearing Restrictions: No   Pertinent Vitals/Pain HR 52 at rest in chair;  64 at highest during ambulation       Mobility  Bed Mobility Bed Mobility: Not assessed Details for Bed Mobility Assistance: Pt. sitting in chair. Transfers Transfers: Sit to Stand;Stand to Sit Sit to Stand: 4: Min assist;From chair/3-in-1;With armrests Stand to Sit: 4: Min guard;With armrests;To chair/3-in-1 Details for Transfer Assistance: Pt. required cueing for hand placement; pulling on RW to stand. Physical assist to stand from chair secondary to h/o bil knee pain.  Ambulation/Gait Ambulation/Gait Assistance: 4: Min guard Ambulation Distance (Feet): 130 Feet Assistive device: Rolling walker Ambulation/Gait Assistance Details: Pt. required cuieng to stay inside RW. Pt. limited in ambulation secondary to fatigue. Gait Pattern: Step-through pattern;Decreased stride length Gait velocity:  decreased Stairs: No           PT Diagnosis: Difficulty walking  PT Problem List: Decreased activity tolerance;Decreased mobility;Decreased knowledge of use of DME PT Treatment Interventions: Gait training;Stair training;DME instruction;Functional mobility training;Therapeutic activities;Therapeutic exercise;Patient/family education   PT Goals Acute Rehab PT Goals PT Goal Formulation: With patient Time For Goal Achievement: 05/21/12 Potential to Achieve Goals: Good Pt will go Sit to Stand: with modified independence PT Goal: Sit to Stand - Progress: Goal set today Pt will go Stand to Sit: with modified independence PT Goal: Stand to Sit - Progress: Goal set today Pt will Ambulate: >150 feet;with modified independence;with rolling walker PT Goal: Ambulate - Progress: Goal set today Pt will Go Up / Down Stairs: 3-5 stairs;with min assist;with least restrictive assistive device PT Goal: Up/Down Stairs - Progress: Goal set today Pt will Perform Home Exercise Program: Independently PT Goal: Perform Home Exercise Program - Progress: Goal set today  Visit Information  Last PT Received On: 05/14/12 Assistance Needed: +1    Subjective Data  Subjective: "My husband spoils me." Patient Stated Goal: Return home   Prior Functioning  Home Living Lives With: Spouse;Family (Sister-in-law) Available Help at Discharge: Family;Available 24 hours/day Type of Home: House Home Access: Stairs to enter [employer] of Steps: 3 Entrance Stairs-Rails: None Home Layout: One level Bathroom Shower/Tub: Engineer, manufacturing systems: Handicapped height Home Adaptive Equipment: Hand-held shower hose;Straight cane;Walker - rolling Additional Comments: Pts. RW in her room Prior Function Level of Independence: Needs assistance Needs Assistance: Dressing;Light Housekeeping;Meal Prep Dressing: Minimal (Helps with shoes and socks) Meal Prep: Supervision/set-up Light Housekeeping:  Total Driving: Yes Vocation: Unemployed Communication Communication: No difficulties Dominant  Hand: Right    Cognition  Overall Cognitive Status: Appears within functional limits for tasks assessed/performed Arousal/Alertness: Awake/alert Orientation Level: Appears intact for tasks assessed Behavior During Session: [facility] for tasks performed    Extremity/Trunk Assessment Right Upper Extremity Assessment RUE ROM/Strength/Tone: [facility] for tasks assessed Left Upper Extremity Assessment LUE ROM/Strength/Tone: [facility] for tasks assessed Right Lower Extremity Assessment RLE ROM/Strength/Tone: [facility] for tasks assessed Left Lower Extremity Assessment LLE ROM/Strength/Tone: WFL for tasks assessed Trunk Assessment Trunk Assessment: Normal      End of Session PT - End of Session Equipment Utilized During Treatment: Gait belt Activity Tolerance: Patient limited by fatigue Patient left: in chair;with call bell/phone within reach Nurse Communication: Mobility status    Army Chaco SPT 05/14/2012, 2:22 PM

## 2012-05-15 LAB — BASIC METABOLIC PANEL
BUN: 12 mg/dL (ref 6–23)
CO2: 27 mEq/L (ref 19–32)
Calcium: 8.9 mg/dL (ref 8.4–10.5)
Creatinine, Ser: 1.34 mg/dL — ABNORMAL HIGH (ref 0.50–1.10)

## 2012-05-15 LAB — TRIGLYCERIDES: Triglycerides: 135 mg/dL (ref <150)

## 2012-05-15 LAB — GLUCOSE, CAPILLARY: Glucose-Capillary: 96 mg/dL (ref 70–99)

## 2012-05-15 MED ORDER — ONDANSETRON HCL 4 MG PO TABS: 4.0000 mg | ORAL_TABLET | Freq: Four times a day (QID) | ORAL | Status: DC | PRN

## 2012-05-15 MED ORDER — INSULIN GLARGINE 100 UNIT/ML ~~LOC~~ SOLN: 20.0000 [IU] | Freq: Every day | SUBCUTANEOUS | Status: DC

## 2012-05-15 MED ORDER — SUCRALFATE 1 GM/10ML PO SUSP: 1.0000 g | Freq: Three times a day (TID) | ORAL | Status: DC

## 2012-05-15 MED ORDER — PANTOPRAZOLE SODIUM 40 MG PO TBEC: 40.0000 mg | DELAYED_RELEASE_TABLET | Freq: Two times a day (BID) | ORAL | Status: DC

## 2012-05-15 NOTE — Progress Notes (Signed)
05/15/12 1145 Noted orders for [facility] PT.  In to speak with pt. about HH and pt. stated she has used [facility] in past and was satisfied with their services.  TC to [place], with [facility], to give referral for [facility] PT.  Pt. to dc home today and will be transported home by family. Tera Mater, RN, BSN NCM [phone]

## 2012-05-15 NOTE — Progress Notes (Signed)
Pt d/c home with husband. D/c instructions and medications reviewed with Pt. Pt states understanding. All Pt questions answered

## 2012-05-28 NOTE — Discharge Summary (Signed)
Physician Discharge Summary  Patient ID: [name] Fry MRN: [id number] DOB/AGE: [birth year] 61 y.o.  Admit date: 05/06/2012 Discharge date: 05/28/2012  Primary Care Physician:  Judene Companion, MD   Discharge Diagnoses:    Principal Problem:  *Acute respiratory failure with hypoxia Active Problems:  Diabetes mellitus type 2, uncontrolled, with complications  Hyperlipidemia  Multiple sclerosis  Morbid obesity  Normal coronary arteries by cath 2008 & 12/ 2013; Normal EF;  LVH on Echo    Acute on chronic diastolic HF (heart failure)  HTN (hypertension), malignant  Hypertensive hypertrophic cardiomyopathy, by echo 05/07/12 Elevated cardiac enzymes, demand ischemia  Goiter, chronic  Leukocytosis  Pneumonia  Dysphagia      Medication List     As of 05/28/2012 10:44 PM    STOP taking these medications         losartan 100 MG tablet   Commonly known as: COZAAR      TAKE these medications         amLODipine 10 MG tablet   Commonly known as: NORVASC   Take 10 mg by mouth daily.      atorvastatin 20 MG tablet   Commonly known as: LIPITOR   Take 20 mg by mouth daily.      fluticasone 50 MCG/ACT nasal spray   Commonly known as: FLONASE   Place 2 sprays into the nose daily.      furosemide 40 MG tablet   Commonly known as: LASIX   Take 40 mg by mouth daily.      hydrALAZINE 25 MG tablet   Commonly known as: APRESOLINE   Take 50 mg by mouth 2 (two) times daily.      insulin glargine 100 UNIT/ML injection   Commonly known as: LANTUS   Inject 20 Units into the skin at bedtime.      interferon beta-1a 30 MCG/0.5ML injection   Commonly known as: AVONEX   Inject 30 mcg into the muscle every 7 (seven) days.      metFORMIN 1000 MG tablet   Commonly known as: GLUCOPHAGE   Take 1,000 mg by mouth 2 (two) times daily with a meal.      metoprolol succinate 100 MG 24 hr tablet   Commonly known as: TOPROL-XL   Take 1 tablet (100 mg total) by mouth daily. Take with or  immediately following a meal.      ondansetron 4 MG tablet   Commonly known as: ZOFRAN   Take 1 tablet (4 mg total) by mouth every 6 (six) hours as needed.      pantoprazole 40 MG tablet   Commonly known as: PROTONIX   Take 1 tablet (40 mg total) by mouth 2 (two) times daily before a meal.      sucralfate 1 GM/10ML suspension   Commonly known as: CARAFATE   Take 10 mLs (1 g total) by mouth 4 (four) times daily -  with meals and at bedtime. For 1 week      traZODone 100 MG tablet   Commonly known as: DESYREL   Take 100 mg by mouth at bedtime.         Disposition and Follow-up:  PCP in 1 week CArdiologist/[facility] in 2 weeks  Consults:  Pulm critical care Cardiology: [facility]   Significant Diagnostic Studies:  No results found.  Brief H and P: 61 yo MO AAF with multiple medical problems including MS/HTN/, smoker presented with dyspnea found to be in acute resp failure, secondary to pulm edema,  HTN.  Procedures: 1. Endotracheal intubation 2. CArdiac Cath: 12/2: Essentially Angiographically normal coronary arteries with only mild proximal LAD disease   Hospital Course:   1. *Acute respiratory failure with hypoxia  Secondary to pulmonary edema/ Hypertensive urgency resolved  Required intubation from 12/1 through 12/13  2. Acute on chronic diastolic HF (heart failure)  Aggressively diuresed initially with IV lasix then transitioned to PO lasix  Continue metoprolol and PO lasix  7.2L negative so far   3. Health care associated/Ventilator associated pneumonia  On Levaquin/VAnc for 5 days , sputum Cx. negative Then transitioned to PO levaquin   4. Dysphagia with heartburn : Likely from intubation and heartburn Esophagram with GERD, continue PPI BID  Advance diet gradually, started on carafate, Fu with Gi after medical management if symptoms dont resolve   5. HTN /Hypertensive hypertrophic cardiomyopathy, by echo 05/07/12  cont metoprolol, hydralazine, DC losartan   6.  ELevated cardiac enzymes/- Troponin 1.4: due to demand ischemia  Normal coronary arteries by cath 2008 & 12/ 2013; Normal EF; LVH on Echo   7. Goiter, chronic   8. Diabetes mellitus type 2, uncontrolled, with complications  Stable, on lantus, metformin, SSI   9. Multiple sclerosis: stable      Time spent on Discharge:  Signed: Dorismar Chay [facility]  05/28/2012, 10:44 PM

## 2012-07-21 ENCOUNTER — Other Ambulatory Visit: Payer: Self-pay

## 2012-10-15 ENCOUNTER — Encounter: Payer: Self-pay | Admitting: Cardiovascular Disease

## 2012-10-17 ENCOUNTER — Ambulatory Visit (INDEPENDENT_AMBULATORY_CARE_PROVIDER_SITE_OTHER): Payer: 59 | Admitting: Cardiology

## 2012-10-17 ENCOUNTER — Encounter: Payer: Self-pay | Admitting: Cardiology

## 2012-10-17 VITALS — BP 120/70 | HR 66 | Ht 61.0 in | Wt 235.0 lb

## 2012-10-17 DIAGNOSIS — Z9889 Other specified postprocedural states: Secondary | ICD-10-CM

## 2012-10-17 DIAGNOSIS — E785 Hyperlipidemia, unspecified: Secondary | ICD-10-CM

## 2012-10-17 DIAGNOSIS — I10 Essential (primary) hypertension: Secondary | ICD-10-CM | POA: Insufficient documentation

## 2012-10-17 DIAGNOSIS — E049 Nontoxic goiter, unspecified: Secondary | ICD-10-CM

## 2012-10-17 DIAGNOSIS — G35 Multiple sclerosis: Secondary | ICD-10-CM

## 2012-10-17 DIAGNOSIS — I5032 Chronic diastolic (congestive) heart failure: Secondary | ICD-10-CM

## 2012-10-17 HISTORY — DX: Other specified postprocedural states: Z98.890

## 2012-10-17 HISTORY — DX: Chronic diastolic (congestive) heart failure: I50.32

## 2012-10-17 NOTE — Assessment & Plan Note (Signed)
Stable

## 2012-10-17 NOTE — Progress Notes (Signed)
10/17/2012  PCP: [name], MD   Chief Complaint  Patient presents with  . Follow-up    Primary Cardiologist:  Dr. Erlene Quan   HPI: 62 y.o. female with a past medical history significant for HTN, DM, morbid obesity, and MS, presented today for routine office visit. She was followed by Dr Mervyn Skeeters. Little in the past and now by [name]. She had a cath in 2008 that showed normal coronaries and LVF and repeat cardiac catheterization December of 2013 by Dr. Herbie Baltimore with normal coronary arteries and mild proximal LAD disease of 20%. This was done for non-STEMI secondary to acute pneumonia requiring intubation for respiratory failure. She also had accelerated hypertension felt to be cause of non-ST elevation MI. She has had difficult to control HTN, though today is well-controlled.  Last echo was December 2013 had severe LVH with normal LV size consistent with hypertensive hypertrophic cardiomyopathy.  She was last seen by [name] in January and medications were adjusted for hypertension at that time.  Today she has no complaints she states she feels quite well she has changed her primary care physician from Dr. Leretha Dykes to Dr. Margaretmary Bayley. She denies chest pain or shortness of breath and no palpitations.  She is somewhat concerned that her weight has increased between 8-10 pounds.  Which is similar to wait in our office on her last visit she was 226 and today she's 235 pounds.     Allergies  Allergen Reactions  . Codeine Other (See Comments)    unknown  . Penicillins Other (See Comments)    unknown  . Tape     Pink tape    Current Outpatient Prescriptions  Medication Sig Dispense Refill  . amLODipine (NORVASC) 10 MG tablet Take 10 mg by mouth daily.      Marland Kitchen aspirin 81 MG tablet Take 81 mg by mouth daily.      Marland Kitchen atorvastatin (LIPITOR) 20 MG tablet Take 20 mg by mouth daily.      . fluticasone (FLONASE) 50 MCG/ACT nasal spray Place 2 sprays into the nose daily.  1 g  0  . furosemide  (LASIX) 40 MG tablet Take 40 mg by mouth daily.      . hydrALAZINE (APRESOLINE) 25 MG tablet Take 50 mg by mouth 2 (two) times daily.      . insulin glargine (LANTUS) 100 UNIT/ML injection Inject 20 Units into the skin at bedtime.  10 mL  0  . interferon beta-1a (AVONEX) 30 MCG/0.5ML injection Inject 30 mcg into the muscle every 7 (seven) days.      . metFORMIN (GLUCOPHAGE-XR) 750 MG 24 hr tablet Take 750 mg by mouth 2 (two) times daily.      . metoprolol succinate (TOPROL-XL) 100 MG 24 hr tablet Take 1 tablet (100 mg total) by mouth daily. Take with or immediately following a meal.  30 tablet  0  . pantoprazole (PROTONIX) 40 MG tablet Take 1 tablet (40 mg total) by mouth 2 (two) times daily before a meal.  60 tablet  0  . sucralfate (CARAFATE) 1 GM/10ML suspension Take 10 mLs (1 g total) by mouth 4 (four) times daily -  with meals and at bedtime. For 1 week  420 mL  0  . traZODone (DESYREL) 100 MG tablet Take 100 mg by mouth at bedtime.      . metFORMIN (GLUCOPHAGE) 1000 MG tablet Take 1,000 mg by mouth 2 (two) times daily with a meal.      .  ondansetron (ZOFRAN) 4 MG tablet Take 1 tablet (4 mg total) by mouth every 6 (six) hours as needed.  20 tablet  0   No current facility-administered medications for this visit.    Past Medical History  Diagnosis Date  . Diabetes mellitus   . Hypertension 05/07/2012    Normal 2D Echo; Renal Arterial Doppler 02/02/2007 - less than 60% diameter reduction, left proximal renal artery  . Asthma   . COPD (chronic obstructive pulmonary disease)   . Multiple sclerosis   . Goiter   . Hypertensive hypertrophic cardiomyopathy, by echo 05/07/12 05/08/2012  . Dyspnea 12/12/2005    CHF and Unspecified Chest pain - Myocardial Perfusion - post stress EF-72, negative for ischemia  . Hx of cardiac catheterization     normal cardiac cath  . Chronic diastolic HF (heart failure) 10/17/2012  . H/O cardiac catheterization 10/17/2012    Past Surgical History  Procedure  Laterality Date  . Abdominal hysterectomy  Nov 2005  . Cardiac catheterization Left 05/06/2012    Moderate caliber vessel from mid LAD  . Cardiac catheterization Right 04/02/2007    Left ventricular hypertrophy, no coronary disease, no renal artery stenosis    DGU:[id number]:QV colds or fevers, increasing weight changes Skin:no rashes or ulcers HEENT:no blurred vision, no congestion CV:see HPI PUL:see HPI GI:no diarrhea constipation or melena, no indigestion GU:no hematuria, no dysuria MS:no joint pain, no claudication Neuro:no syncope, no lightheadedness, though she stated she had rare dizziness Endo:no diabetes, no thyroid disease PHYSICAL EXAM BP 120/70  Pulse 66  Ht 5\' 1"  (1.549 m)  Wt 235 lb (106.595 kg)  BMI 44.43 kg/m2 General:Pleasant affect, NAD Skin:Warm and dry, brisk capillary refill HEENT:normocephalic, sclera clear, mucus membranes moist Neck:supple, no JVD, no bruits, large thyroid goiter followed by Dr. Chestine Spore  Heart:S1S2 RRR without murmur, gallup, rub or click Lungs:clear without rales, rhonchi, or wheezes ZDG:LOVFI,EPPI, non tender, + BS, do not palpate liver spleen or masses Ext:no lower ext edema, 2+ pedal pulses, 2+ radial pulses Neuro:alert and oriented, MAE, follows commands, + facial symmetry  EKG: Sinus rhythm rate of 66 new EKG changes from previous tracing normal EKG.  ASSESSMENT AND PLAN Chronic diastolic HF (heart failure) Mild volume overload, will increase lasix for 1 week.  She'll take 80 mg of Lasix on Thursday Saturday and Monday and 40 mg other days after Monday she'll go back to 40 mg daily. She'll call if she develops any increasing shortness of breath or swelling.  I will also check a basic metabolic panel today.  Multiple sclerosis Stable   Hyperlipidemia Stable   Morbid obesity Obesity continues, some with volume overload today  Goiter, chronic Followed by Dr. Chestine Spore  HTN (hypertension) Currently controlled  H/O cardiac  catheterization, 05/2012 with normal coronary arteries No chest pain

## 2012-10-17 NOTE — Assessment & Plan Note (Addendum)
Mild volume overload, will increase lasix for 1 week.  She'll take 80 mg of Lasix on Thursday Saturday and Monday and 40 mg other days after Monday she'll go back to 40 mg daily. She'll call if she develops any increasing shortness of breath or swelling.  I will also check a basic metabolic panel today.

## 2012-10-17 NOTE — Assessment & Plan Note (Signed)
Followed by Dr. Chestine Spore

## 2012-10-17 NOTE — Assessment & Plan Note (Signed)
No chest pain

## 2012-10-17 NOTE — Patient Instructions (Addendum)
Increase your Lasix (furosemide) to 80 mg Thursday, Sat. And Monday 40 mg all other days. Only increase for those 3 days.  Follow up with [name] in 6 months.  Call us if more swelling or SOB.    Have blood work done this week.

## 2012-10-17 NOTE — Assessment & Plan Note (Signed)
Obesity continues, some with volume overload today

## 2012-10-17 NOTE — Assessment & Plan Note (Signed)
Currently controlled.

## 2012-10-18 LAB — BASIC METABOLIC PANEL
CO2: 30 mEq/L (ref 19–32)
Chloride: 101 mEq/L (ref 96–112)
Creat: 0.94 mg/dL (ref 0.50–1.10)
Potassium: 4.4 mEq/L (ref 3.5–5.3)
Sodium: 140 mEq/L (ref 135–145)

## 2012-10-19 ENCOUNTER — Telehealth: Payer: Self-pay | Admitting: Cardiovascular Disease

## 2012-10-19 NOTE — Telephone Encounter (Signed)
[name] Fry is calling for her results of test that was on 10/17/12.Marland Kitchen Please call her @ [phone]

## 2012-10-19 NOTE — Telephone Encounter (Signed)
Message forwarded to K. Vogel, RN.  

## 2012-10-22 ENCOUNTER — Telehealth: Payer: Self-pay | Admitting: Cardiology

## 2012-10-22 NOTE — Telephone Encounter (Signed)
Patient was supposed to hear from [name] Fry on Friday regarding lab tests.  She is taking extra fluid pills and needs to know what to do.

## 2012-10-22 NOTE — Telephone Encounter (Signed)
Called Friday to get lab results from [facility] waiting to hear something!She needs to know how to take her fluid pills.

## 2012-10-22 NOTE — Telephone Encounter (Signed)
Returned call.  Stated she saw L. Annie Paras, NP on the 14th.  Stated she received a call on Friday, but missed it.  Chart reviewed and pt informed per note that her labs are stable.  Pt also informed per OV note on 5.14.14, that she is to take 40mg  lasix daily.  Pt verbalized understanding and agreed w/ plan.

## 2012-11-12 ENCOUNTER — Encounter: Payer: Self-pay | Admitting: *Deleted

## 2012-11-22 ENCOUNTER — Other Ambulatory Visit: Payer: Self-pay | Admitting: Cardiovascular Disease

## 2012-12-05 ENCOUNTER — Encounter: Payer: Self-pay | Admitting: Neurology

## 2012-12-05 ENCOUNTER — Ambulatory Visit (INDEPENDENT_AMBULATORY_CARE_PROVIDER_SITE_OTHER): Payer: 59 | Admitting: Neurology

## 2012-12-05 DIAGNOSIS — R269 Unspecified abnormalities of gait and mobility: Secondary | ICD-10-CM

## 2012-12-05 DIAGNOSIS — Z5181 Encounter for therapeutic drug level monitoring: Secondary | ICD-10-CM

## 2012-12-05 DIAGNOSIS — G35 Multiple sclerosis: Secondary | ICD-10-CM

## 2012-12-05 DIAGNOSIS — G35D Multiple sclerosis, unspecified: Secondary | ICD-10-CM

## 2012-12-05 DIAGNOSIS — R6889 Other general symptoms and signs: Secondary | ICD-10-CM

## 2012-12-05 DIAGNOSIS — M79609 Pain in unspecified limb: Secondary | ICD-10-CM

## 2012-12-05 DIAGNOSIS — D518 Other vitamin B12 deficiency anemias: Secondary | ICD-10-CM

## 2012-12-05 NOTE — Progress Notes (Signed)
Reason for visit: Multiple sclerosis  [name] Fry is an 62 y.o. female  History of present illness:  [name] Fry is a 62 year old right-handed black female with a history of obesity, diabetes, multiple sclerosis, and a gait disorder. The patient tends to space out her revisit over a long durations, the patient was last seen through this office in November of 2012. The patient has been on Avonex, and she is tolerating the medication well. The patient indicates that she was in the hospital in December 2013 with congestive heart failure and pneumonia. The patient has had some alteration in her ability to ambulate since that time, and she has not gotten back to baseline. The patient had home health physical therapy, and she has been using a walker. The patient is still falling on occasion. The patient feels as if both legs are weak. The patient has some numbness in the hands, not the legs. The patient indicates that she has some problems with the bladder, and she is on Detrol for this. The patient had a MRI scan of the head since 2007. The patient returns for an evaluation.  Past Medical History  Diagnosis Date  . Diabetes mellitus   . Hypertension 05/07/2012    Normal 2D Echo; Renal Arterial Doppler 02/02/2007 - less than 60% diameter reduction, left proximal renal artery  . Asthma   . COPD (chronic obstructive pulmonary disease)   . Multiple sclerosis   . Goiter   . Hypertensive hypertrophic cardiomyopathy, by echo 05/07/12 05/08/2012  . Dyspnea 12/12/2005    CHF and Unspecified Chest pain - Myocardial Perfusion - post stress EF-72, negative for ischemia  . Hx of cardiac catheterization     normal cardiac cath  . Chronic diastolic HF (heart failure) 10/17/2012  . H/O cardiac catheterization 10/17/2012  . Hypothyroidism   . Depression   . Dyslipidemia   . Chronic low back pain   . Peripheral edema   . Neurogenic bladder   . Obesity   . Gait disorder     Past Surgical History   Procedure Laterality Date  . Abdominal hysterectomy  Nov 2005  . Cardiac catheterization Left 05/06/2012    Moderate caliber vessel from mid LAD  . Cardiac catheterization Right 04/02/2007    Left ventricular hypertrophy, no coronary disease, no renal artery stenosis    Family History  Problem Relation Age of Onset  . Coronary artery disease Father 20    MI  . Diabetes Father   . Heart disease Mother   . Diabetes Mother     Social history:  reports that she quit smoking about 13 months ago. Her smoking use included Cigarettes. She smoked 0.00 packs per day. She has never used smokeless tobacco. She reports that she does not drink alcohol or use illicit drugs.  Allergies:  Allergies  Allergen Reactions  . Codeine Other (See Comments)    unknown  . Penicillins Other (See Comments)    unknown  . Tape     Pink tape    Medications:  Current Outpatient Prescriptions on File Prior to Visit  Medication Sig Dispense Refill  . amLODipine (NORVASC) 10 MG tablet Take 10 mg by mouth daily.      Marland Kitchen aspirin 81 MG tablet Take 81 mg by mouth daily.      Marland Kitchen atorvastatin (LIPITOR) 20 MG tablet Take 20 mg by mouth daily.      . furosemide (LASIX) 40 MG tablet Take 40 mg by mouth daily.      Marland Kitchen  hydrALAZINE (APRESOLINE) 25 MG tablet Take 50 mg by mouth 2 (two) times daily.      . interferon beta-1a (AVONEX) 30 MCG/0.5ML injection Inject 30 mcg into the muscle every 7 (seven) days.      . metFORMIN (GLUCOPHAGE-XR) 750 MG 24 hr tablet Take 750 mg by mouth 2 (two) times daily.      . metoprolol succinate (TOPROL-XL) 100 MG 24 hr tablet Take 1 tablet (100 mg total) by mouth daily. Take with or immediately following a meal.  30 tablet  0  . pantoprazole (PROTONIX) 40 MG tablet Take 1 tablet by mouth daily **NEED APPOINTMENT FOR FURTHER REFILLS**  90 tablet  0  . sucralfate (CARAFATE) 1 GM/10ML suspension Take 10 mLs (1 g total) by mouth 4 (four) times daily -  with meals and at bedtime. For 1 week  420  mL  0  . traZODone (DESYREL) 100 MG tablet Take 100 mg by mouth at bedtime.      . fluticasone (FLONASE) 50 MCG/ACT nasal spray Place 2 sprays into the nose daily.  1 g  0   No current facility-administered medications on file prior to visit.    ROS:  Out of a complete 14 system review of symptoms, the patient complains only of the following symptoms, and all other reviewed systems are negative.  Weight loss, fatigue Spinning sensations, difficulty swallowing Skin rash Eye pain Shortness of breath Easy bruising Joint pain, muscle cramps, achy muscles Skin sensitivity Memory disturbance, numbness, weakness Dizziness Tremor Decreased energy, insomnia  Blood pressure 118/67, pulse 80, height 5\' 1"  (1.549 m), weight 240 lb (108.863 kg).  Physical Exam  General: The patient is alert and cooperative at the time of the examination. The patient is markedly obese.  Skin: 1+ edema is noted in the ankles bilaterally.   Neurologic Exam  Cranial nerves: Facial symmetry is present. Speech is normal, no aphasia or dysarthria is noted. Extraocular movements are full with the left eye. There is exotropia of the right, incomplete abduction of this eye. Visual fields are full.  Motor: The patient has giveaway weakness involving the hands and the lower extent is bilaterally. The proximal arms appear to be of normal strength. The patient is unable to arise from a seated position with arms crossed.  Coordination: The patient has good finger-nose-finger and heel-to-shin bilaterally.  Gait and station: The patient has a slightly wide-based, unsteady gait. The patient uses a walker for ambulation. Tandem gait is unsteady. Romberg is negative. No drift is seen.  Reflexes: Deep tendon reflexes are symmetric, but are depressed.   Assessment/Plan:  1. Multiple sclerosis  2. Obesity  3. Gait disorder  4. Diabetes  5. Congenital strabismus  The patient believes that the lower extremities  are weaker than usual, and she is having some problems with walking. The patient will be set up for MRI evaluation of the brain, and for physical therapy for gait training. The patient will remain on Avonex for now, but she may be switched to another medication if significant progression is noted by MRI. Clinical examination shows a lot of giveaway weakness of the legs. The patient will followup in 6 months.  Marlan Palau MD 12/05/2012 9:05 PM  [facility] [address] [place], [place] [address]  Phone [phone] Fax [phone]

## 2012-12-06 ENCOUNTER — Other Ambulatory Visit: Payer: Self-pay | Admitting: Cardiovascular Disease

## 2012-12-06 ENCOUNTER — Telehealth: Payer: Self-pay | Admitting: Neurology

## 2012-12-06 LAB — TSH: TSH: 0.886 u[IU]/mL (ref 0.450–4.500)

## 2012-12-06 LAB — COMPREHENSIVE METABOLIC PANEL
ALT: 15 IU/L (ref 0–32)
AST: 10 IU/L (ref 0–40)
BUN: 13 mg/dL (ref 8–27)
Calcium: 9.4 mg/dL (ref 8.6–10.2)
Chloride: 98 mmol/L (ref 97–108)
Creatinine, Ser: 0.95 mg/dL (ref 0.57–1.00)
Potassium: 3.9 mmol/L (ref 3.5–5.2)
Sodium: 139 mmol/L (ref 134–144)
Total Bilirubin: 0.6 mg/dL (ref 0.0–1.2)
Total Protein: 7.7 g/dL (ref 6.0–8.5)

## 2012-12-06 LAB — CBC WITH DIFFERENTIAL
Basophils Absolute: 0 10*3/uL (ref 0.0–0.2)
Eosinophils Absolute: 0.3 10*3/uL (ref 0.0–0.4)
HCT: 41.1 % (ref 34.0–46.6)
Hemoglobin: 13.4 g/dL (ref 11.1–15.9)
Immature Grans (Abs): 0 10*3/uL (ref 0.0–0.1)
Lymphs: 27 % (ref 14–46)
Monocytes: 7 % (ref 4–12)
Neutrophils Absolute: 7.8 10*3/uL — ABNORMAL HIGH (ref 1.4–7.0)
Neutrophils Relative %: 63 % (ref 40–74)
RDW: 14.5 % (ref 12.3–15.4)
WBC: 12.4 10*3/uL — ABNORMAL HIGH (ref 3.4–10.8)

## 2012-12-06 LAB — VITAMIN B12: Vitamin B-12: 850 pg/mL (ref 211–946)

## 2012-12-06 NOTE — Telephone Encounter (Signed)
I called patient. The blood work that was done shows a mild elevation in alkaline phosphatase level of 125. There is a chronic stable elevation in the white blood count is minimal, and it has actually reduced from 7 months ago when he was running around 20. The platelet level is slightly low at 141, but this is higher than what it was 7 months ago at 111. I discussed this with the patient. Thyroid and B12 levels are unremarkable.

## 2012-12-06 NOTE — Telephone Encounter (Signed)
Note filed in error.

## 2012-12-10 ENCOUNTER — Telehealth: Payer: Self-pay | Admitting: Cardiovascular Disease

## 2012-12-10 NOTE — Telephone Encounter (Signed)
Returned call.  Pt stated she is 14 lbs over her target weight.  Denied SOB.  See weights below:  Today: 240 lbs Sun: 239 lbs Sat: 239 lbs Fri: 239 lbs Th: 238 lbs W: 239 lbs  Asked pt when was the last time she was at her target weight.  Pt stated in December 2013.  Pt advised to continue to monitor weight and if she gains 3 lbs or more in one day or 5 lbs in one week and if she is having SOB w/ swelling to call the office.  Pt verbalized understanding and agreed w/ plan.

## 2012-12-10 NOTE — Telephone Encounter (Signed)
Pt called stating that has gained 14 lbs in the last couple of days. Was supposed to call in if pt has gained more than 5 lbs.

## 2012-12-17 ENCOUNTER — Ambulatory Visit: Payer: 59 | Attending: Neurology

## 2012-12-17 DIAGNOSIS — R269 Unspecified abnormalities of gait and mobility: Secondary | ICD-10-CM | POA: Insufficient documentation

## 2012-12-17 DIAGNOSIS — G35 Multiple sclerosis: Secondary | ICD-10-CM | POA: Insufficient documentation

## 2012-12-17 DIAGNOSIS — R4701 Aphasia: Secondary | ICD-10-CM | POA: Insufficient documentation

## 2012-12-17 DIAGNOSIS — R4189 Other symptoms and signs involving cognitive functions and awareness: Secondary | ICD-10-CM | POA: Insufficient documentation

## 2012-12-17 DIAGNOSIS — R279 Unspecified lack of coordination: Secondary | ICD-10-CM | POA: Insufficient documentation

## 2012-12-17 DIAGNOSIS — R5381 Other malaise: Secondary | ICD-10-CM | POA: Insufficient documentation

## 2012-12-17 DIAGNOSIS — M6281 Muscle weakness (generalized): Secondary | ICD-10-CM | POA: Insufficient documentation

## 2012-12-17 DIAGNOSIS — Z5189 Encounter for other specified aftercare: Secondary | ICD-10-CM | POA: Insufficient documentation

## 2012-12-17 DIAGNOSIS — R413 Other amnesia: Secondary | ICD-10-CM | POA: Insufficient documentation

## 2012-12-19 DIAGNOSIS — G35 Multiple sclerosis: Secondary | ICD-10-CM

## 2012-12-20 ENCOUNTER — Other Ambulatory Visit: Payer: Self-pay | Admitting: Diagnostic Neuroimaging

## 2012-12-20 DIAGNOSIS — G35 Multiple sclerosis: Secondary | ICD-10-CM

## 2012-12-21 ENCOUNTER — Ambulatory Visit: Payer: 59 | Admitting: Physical Therapy

## 2012-12-22 ENCOUNTER — Telehealth: Payer: Self-pay | Admitting: Neurology

## 2012-12-22 NOTE — Telephone Encounter (Signed)
I called patient. The MRI study shows some white matter lesions in the right brain mainly. The study is unchanged from 2008. I discussed this with the patient.

## 2012-12-24 ENCOUNTER — Ambulatory Visit: Payer: 59

## 2012-12-26 ENCOUNTER — Ambulatory Visit: Payer: 59 | Admitting: Physical Therapy

## 2012-12-31 ENCOUNTER — Ambulatory Visit: Payer: 59 | Admitting: Speech Pathology

## 2012-12-31 ENCOUNTER — Ambulatory Visit: Payer: 59 | Admitting: Physical Therapy

## 2013-01-02 ENCOUNTER — Ambulatory Visit: Payer: 59 | Admitting: Occupational Therapy

## 2013-01-02 ENCOUNTER — Ambulatory Visit: Payer: 59 | Admitting: Physical Therapy

## 2013-01-07 ENCOUNTER — Ambulatory Visit: Payer: 59 | Admitting: Occupational Therapy

## 2013-01-07 ENCOUNTER — Ambulatory Visit: Payer: 59 | Attending: Neurology | Admitting: Physical Therapy

## 2013-01-07 DIAGNOSIS — R4701 Aphasia: Secondary | ICD-10-CM | POA: Insufficient documentation

## 2013-01-07 DIAGNOSIS — M6281 Muscle weakness (generalized): Secondary | ICD-10-CM | POA: Insufficient documentation

## 2013-01-07 DIAGNOSIS — G35 Multiple sclerosis: Secondary | ICD-10-CM | POA: Insufficient documentation

## 2013-01-07 DIAGNOSIS — R413 Other amnesia: Secondary | ICD-10-CM | POA: Insufficient documentation

## 2013-01-07 DIAGNOSIS — Z5189 Encounter for other specified aftercare: Secondary | ICD-10-CM | POA: Insufficient documentation

## 2013-01-07 DIAGNOSIS — R269 Unspecified abnormalities of gait and mobility: Secondary | ICD-10-CM | POA: Insufficient documentation

## 2013-01-07 DIAGNOSIS — R279 Unspecified lack of coordination: Secondary | ICD-10-CM | POA: Insufficient documentation

## 2013-01-07 DIAGNOSIS — R4189 Other symptoms and signs involving cognitive functions and awareness: Secondary | ICD-10-CM | POA: Insufficient documentation

## 2013-01-07 DIAGNOSIS — R5381 Other malaise: Secondary | ICD-10-CM | POA: Insufficient documentation

## 2013-01-09 ENCOUNTER — Ambulatory Visit: Payer: 59 | Admitting: Physical Therapy

## 2013-01-09 ENCOUNTER — Ambulatory Visit: Payer: 59 | Admitting: Occupational Therapy

## 2013-01-10 ENCOUNTER — Other Ambulatory Visit: Payer: Self-pay | Admitting: Internal Medicine

## 2013-01-10 ENCOUNTER — Ambulatory Visit (HOSPITAL_COMMUNITY)
Admission: RE | Admit: 2013-01-10 | Discharge: 2013-01-10 | Disposition: A | Discharge status: Home / Self Care | Payer: 59 | Source: Ambulatory Visit | Attending: Internal Medicine | Admitting: Internal Medicine

## 2013-01-10 DIAGNOSIS — R059 Cough, unspecified: Secondary | ICD-10-CM | POA: Insufficient documentation

## 2013-01-10 DIAGNOSIS — R079 Chest pain, unspecified: Secondary | ICD-10-CM | POA: Insufficient documentation

## 2013-01-10 DIAGNOSIS — M5380 Other specified dorsopathies, site unspecified: Secondary | ICD-10-CM | POA: Insufficient documentation

## 2013-01-10 DIAGNOSIS — R05 Cough: Secondary | ICD-10-CM | POA: Insufficient documentation

## 2013-01-14 ENCOUNTER — Ambulatory Visit: Payer: 59 | Admitting: Physical Therapy

## 2013-01-16 ENCOUNTER — Ambulatory Visit: Payer: 59 | Admitting: Occupational Therapy

## 2013-01-16 ENCOUNTER — Ambulatory Visit: Payer: 59 | Admitting: Physical Therapy

## 2013-01-18 ENCOUNTER — Ambulatory Visit: Payer: 59

## 2013-01-18 ENCOUNTER — Ambulatory Visit: Payer: 59 | Admitting: Occupational Therapy

## 2013-01-21 ENCOUNTER — Ambulatory Visit: Payer: 59 | Admitting: Physical Therapy

## 2013-01-21 ENCOUNTER — Ambulatory Visit: Payer: 59 | Admitting: Occupational Therapy

## 2013-01-23 ENCOUNTER — Ambulatory Visit: Payer: 59 | Admitting: Physical Therapy

## 2013-01-25 ENCOUNTER — Ambulatory Visit: Payer: 59

## 2013-01-25 ENCOUNTER — Ambulatory Visit: Payer: 59 | Admitting: Occupational Therapy

## 2013-01-25 ENCOUNTER — Ambulatory Visit: Payer: 59 | Admitting: Physical Therapy

## 2013-01-28 ENCOUNTER — Ambulatory Visit: Payer: 59 | Admitting: Physical Therapy

## 2013-01-28 ENCOUNTER — Other Ambulatory Visit: Payer: Self-pay

## 2013-01-28 ENCOUNTER — Ambulatory Visit: Payer: 59 | Admitting: Occupational Therapy

## 2013-01-28 MED ORDER — TRAZODONE HCL 100 MG PO TABS: 200.0000 mg | ORAL_TABLET | Freq: Every evening | ORAL | Status: DC | PRN

## 2013-01-28 MED ORDER — INTERFERON BETA-1A 30 MCG/0.5ML IM KIT: 30.0000 ug | PACK | INTRAMUSCULAR | Status: DC

## 2013-01-30 ENCOUNTER — Ambulatory Visit: Payer: 59

## 2013-01-30 ENCOUNTER — Ambulatory Visit: Payer: 59 | Admitting: Occupational Therapy

## 2013-02-01 ENCOUNTER — Ambulatory Visit: Payer: 59

## 2013-02-05 ENCOUNTER — Ambulatory Visit: Payer: 59 | Admitting: Physical Therapy

## 2013-02-06 ENCOUNTER — Ambulatory Visit: Payer: 59 | Admitting: Occupational Therapy

## 2013-02-06 ENCOUNTER — Ambulatory Visit: Payer: 59 | Admitting: Physical Therapy

## 2013-02-06 ENCOUNTER — Ambulatory Visit: Payer: 59 | Attending: Neurology

## 2013-02-06 DIAGNOSIS — R279 Unspecified lack of coordination: Secondary | ICD-10-CM | POA: Insufficient documentation

## 2013-02-06 DIAGNOSIS — R413 Other amnesia: Secondary | ICD-10-CM | POA: Insufficient documentation

## 2013-02-06 DIAGNOSIS — M6281 Muscle weakness (generalized): Secondary | ICD-10-CM | POA: Insufficient documentation

## 2013-02-06 DIAGNOSIS — Z5189 Encounter for other specified aftercare: Secondary | ICD-10-CM | POA: Insufficient documentation

## 2013-02-06 DIAGNOSIS — R4189 Other symptoms and signs involving cognitive functions and awareness: Secondary | ICD-10-CM | POA: Insufficient documentation

## 2013-02-06 DIAGNOSIS — R4701 Aphasia: Secondary | ICD-10-CM | POA: Insufficient documentation

## 2013-02-06 DIAGNOSIS — G35 Multiple sclerosis: Secondary | ICD-10-CM | POA: Insufficient documentation

## 2013-02-06 DIAGNOSIS — R5381 Other malaise: Secondary | ICD-10-CM | POA: Insufficient documentation

## 2013-02-06 DIAGNOSIS — R269 Unspecified abnormalities of gait and mobility: Secondary | ICD-10-CM | POA: Insufficient documentation

## 2013-02-07 ENCOUNTER — Ambulatory Visit: Payer: 59 | Admitting: Physical Therapy

## 2013-02-08 ENCOUNTER — Ambulatory Visit: Payer: 59

## 2013-02-11 ENCOUNTER — Ambulatory Visit: Payer: 59 | Admitting: Physical Therapy

## 2013-02-12 ENCOUNTER — Ambulatory Visit: Payer: 59 | Admitting: Occupational Therapy

## 2013-02-12 ENCOUNTER — Ambulatory Visit: Payer: 59 | Admitting: Physical Therapy

## 2013-02-13 ENCOUNTER — Ambulatory Visit: Payer: 59 | Admitting: Physical Therapy

## 2013-02-14 ENCOUNTER — Ambulatory Visit: Payer: 59

## 2013-02-14 ENCOUNTER — Ambulatory Visit: Payer: 59 | Admitting: Occupational Therapy

## 2013-02-15 ENCOUNTER — Ambulatory Visit: Payer: 59

## 2013-03-04 ENCOUNTER — Other Ambulatory Visit: Payer: Self-pay | Admitting: *Deleted

## 2013-03-04 MED ORDER — PANTOPRAZOLE SODIUM 40 MG PO TBEC: DELAYED_RELEASE_TABLET | ORAL | Status: DC

## 2013-03-04 MED ORDER — FUROSEMIDE 40 MG PO TABS: 40.0000 mg | ORAL_TABLET | Freq: Every day | ORAL | Status: DC

## 2013-03-04 MED ORDER — LOSARTAN POTASSIUM 100 MG PO TABS: 100.0000 mg | ORAL_TABLET | Freq: Every day | ORAL | Status: DC

## 2013-03-04 MED ORDER — METOPROLOL TARTRATE 100 MG PO TABS: 100.0000 mg | ORAL_TABLET | Freq: Two times a day (BID) | ORAL | Status: DC

## 2013-03-04 MED ORDER — AMLODIPINE BESYLATE 10 MG PO TABS: 10.0000 mg | ORAL_TABLET | Freq: Every day | ORAL | Status: DC

## 2013-03-04 MED ORDER — HYDRALAZINE HCL 50 MG PO TABS: 50.0000 mg | ORAL_TABLET | Freq: Two times a day (BID) | ORAL | Status: DC

## 2013-03-04 NOTE — Telephone Encounter (Signed)
Refill request received from [facility].  I called patient to verify medications.  [facility] listed metoprolol tar 100mg  bid and losartan 100mg  qd. Hosptial d/c summary listed losartan as discontinued and follow up office visit in May with Vernona Rieger didn't list losartan as a medication. I called patient and she said that she has been taking the losartan the whole time.  I spoke with Dr Allyson Sabal and he said to continue the losartan.  I corrected the medication list and sent in the RX refills.

## 2013-04-11 ENCOUNTER — Other Ambulatory Visit: Payer: Self-pay

## 2013-04-15 ENCOUNTER — Ambulatory Visit (INDEPENDENT_AMBULATORY_CARE_PROVIDER_SITE_OTHER): Payer: 59 | Admitting: Cardiovascular Disease

## 2013-04-15 ENCOUNTER — Encounter: Payer: Self-pay | Admitting: Cardiovascular Disease

## 2013-04-15 VITALS — BP 130/82 | HR 78 | Ht 61.0 in | Wt 249.2 lb

## 2013-04-15 DIAGNOSIS — E785 Hyperlipidemia, unspecified: Secondary | ICD-10-CM

## 2013-04-15 DIAGNOSIS — I10 Essential (primary) hypertension: Secondary | ICD-10-CM

## 2013-04-15 NOTE — Assessment & Plan Note (Signed)
On statin therapy followed by Dr. Margaretmary Bayley

## 2013-04-15 NOTE — Assessment & Plan Note (Signed)
Well-controlled on current medications 

## 2013-04-15 NOTE — Progress Notes (Signed)
04/15/2013 [name] Fry   February 29, 1951  [id number]  Primary Physician [name], MD Primary Cardiologist: Runell Gess MD [name]   HPI:  The patient is a 62 year old mildly overweight married [place] American female, mother of 3, grandmother to 6 grandchildren, formerly a patient of Dr. Fredirick Maudlin. She has a history of normal coronary arteries by catheterization back in 2008 as well as most recently May 07, 2012, by Dr. Herbie Baltimore. She has severe hypertension, diabetes, and hyperlipidemia. She was admitted to [facility], December 1 through 10, with hypertensive urgency, was intubated, and also treated for pneumonia. Medicines were adjusted. She is aware of salt restriction. She has history of remote tobacco abuse, having stopped in 2005.since I saw her in January she has been completely asymptomatic. Dr. Chestine Spore follows her lipid profile.    Current Outpatient Prescriptions  Medication Sig Dispense Refill  . amLODipine (NORVASC) 10 MG tablet Take 1 tablet (10 mg total) by mouth daily.  90 tablet  3  . aspirin 81 MG tablet Take 81 mg by mouth daily.      Marland Kitchen atorvastatin (LIPITOR) 20 MG tablet Take 20 mg by mouth daily.      Marland Kitchen dextromethorphan-guaiFENesin (DIABETIC TUSSIN DM) 10-100 MG/5ML liquid Take 5 mLs by mouth every 4 (four) hours as needed for cough.      . fluticasone (FLONASE) 50 MCG/ACT nasal spray Place 2 sprays into the nose daily.  1 g  0  . furosemide (LASIX) 40 MG tablet Take 1 tablet (40 mg total) by mouth daily.  90 tablet  3  . hydrALAZINE (APRESOLINE) 50 MG tablet Take 1 tablet (50 mg total) by mouth 2 (two) times daily.  180 tablet  3  . insulin glargine (LANTUS) 100 UNIT/ML injection Inject 40 Units into the skin at bedtime.      . insulin lispro (HUMALOG) 100 UNIT/ML injection Inject 30 Units into the skin daily.      . interferon beta-1a (AVONEX) 30 MCG/0.5ML injection Inject 0.5 mLs (30 mcg total) into the muscle every 7 (seven) days.  1  kit  5  . losartan (COZAAR) 100 MG tablet Take 1 tablet (100 mg total) by mouth daily.  90 tablet  3  . metFORMIN (GLUCOPHAGE-XR) 750 MG 24 hr tablet Take 750 mg by mouth 2 (two) times daily.      . metoprolol (LOPRESSOR) 100 MG tablet Take 1 tablet (100 mg total) by mouth 2 (two) times daily.  180 tablet  3  . pantoprazole (PROTONIX) 40 MG tablet Take 1 tablet by mouth daily  90 tablet  3  . traZODone (DESYREL) 100 MG tablet Take 2 tablets (200 mg total) by mouth at bedtime as needed.  180 tablet  1   No current facility-administered medications for this visit.    Allergies  Allergen Reactions  . Codeine Other (See Comments)    unknown  . Other     Poppy seeds.  . Penicillins Other (See Comments)    unknown  . Tape     Pink tape    History   Social History  . Marital Status: Married    Spouse Name: N/A    Number of Children: 3  . Years of Education: 15   Occupational History  . Not on file.   Social History Main Topics  . Smoking status: Former Smoker    Types: Cigarettes    Quit date: 10/24/2011  . Smokeless tobacco: Never Used  . Alcohol Use:  No  . Drug Use: No  . Sexual Activity: Not on file   Other Topics Concern  . Not on file   Social History Narrative  . No narrative on file     Review of Systems: General: negative for chills, fever, night sweats or weight changes.  Cardiovascular: negative for chest pain, dyspnea on exertion, edema, orthopnea, palpitations, paroxysmal nocturnal dyspnea or shortness of breath Dermatological: negative for rash Respiratory: negative for cough or wheezing Urologic: negative for hematuria Abdominal: negative for nausea, vomiting, diarrhea, bright red blood per rectum, melena, or hematemesis Neurologic: negative for visual changes, syncope, or dizziness All other systems reviewed and are otherwise negative except as noted above.    Blood pressure 130/82, pulse 78, height 5\' 1"  (1.549 m), weight 249 lb 3.2 oz (113.036  kg).  General appearance: alert and no distress Neck: no adenopathy, no carotid bruit, no JVD, supple, symmetrical, trachea midline and thyroid not enlarged, symmetric, no tenderness/mass/nodules Lungs: clear to auscultation bilaterally Heart: regular rate and rhythm, S1, S2 normal, no murmur, click, rub or gallop Extremities: extremities normal, atraumatic, no cyanosis or edema  EKG normal sinus rhythm at 78 with nonspecific ST and T wave changes  ASSESSMENT AND PLAN:   HTN (hypertension) Well-controlled on current medications  Hyperlipidemia On statin therapy followed by Dr. Margaretmary Bayley      Runell Gess MD [facility], [facility] 04/15/2013 2:01 PM

## 2013-04-15 NOTE — Patient Instructions (Signed)
Your physician recommends that you schedule a follow-up appointment in:  1 YEAR. NO CHANGES WERE MADE TODAY IN YOUR THERAPY. 

## 2013-04-16 ENCOUNTER — Encounter: Payer: Self-pay | Admitting: Cardiovascular Disease

## 2013-05-19 IMAGING — RF DG ESOPHAGUS
14 of 23 series · 14 of 24 positions shown · non-contrast
Comparison: None

CLINICAL DATA: 61-year-old female with solid food dysphagia and
nausea.

ESOPHOGRAM / BARIUM SWALLOW / BARIUM TABLET STUDY
TECHNIQUE: Combined double contrast and single contrast
examination performed using effervescent crystals, thick barium
liquid, and thin barium liquid.  The patient was observed with
fluoroscopy swallowing a 13mm barium sulphate tablet.
Fluoroscopy time:  2.1 minutes.

[Series 1: run · 1 of 15 slices shown (1 of 14)]
[im 1/15]
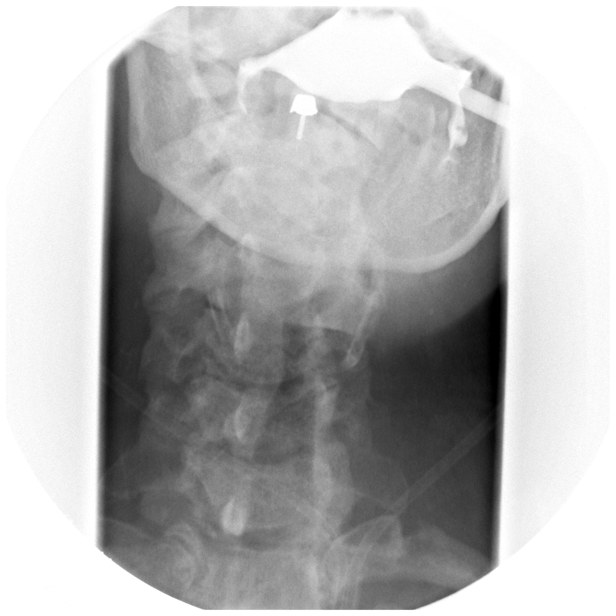

[Series 2: run · 1 of 13 slices shown (2 of 14)]
[im 1/13]
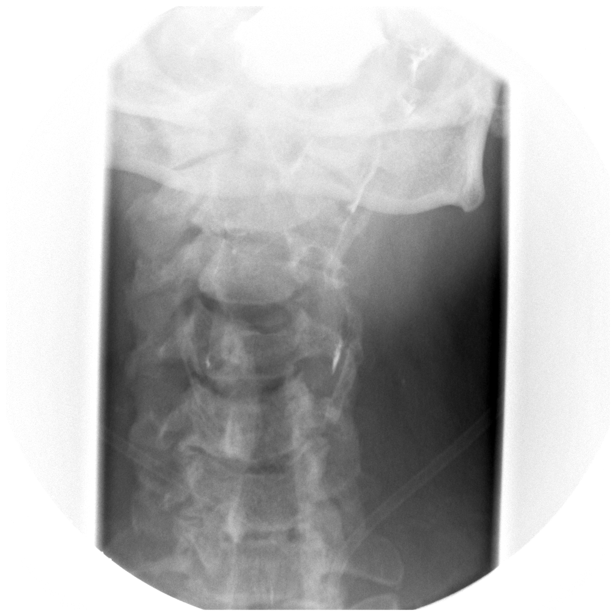

[Series 4: run · 1 of 1 slices shown (3 of 14)]
[im 1/1]
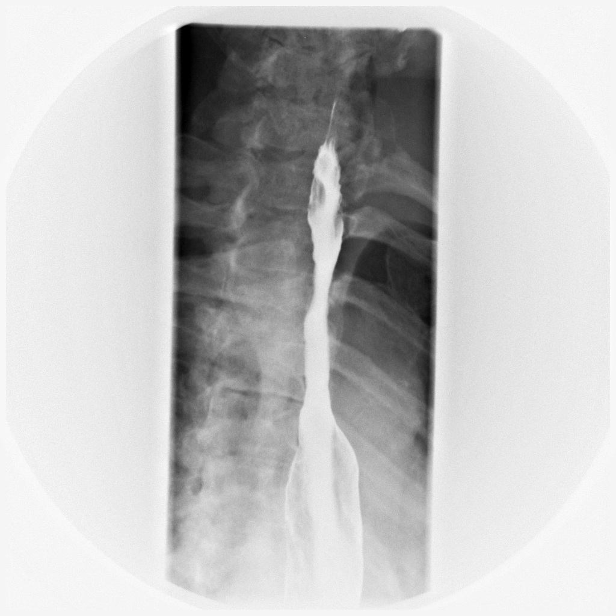

[Series 6: run · 1 of 1 slices shown (4 of 14)]
[im 1/1]
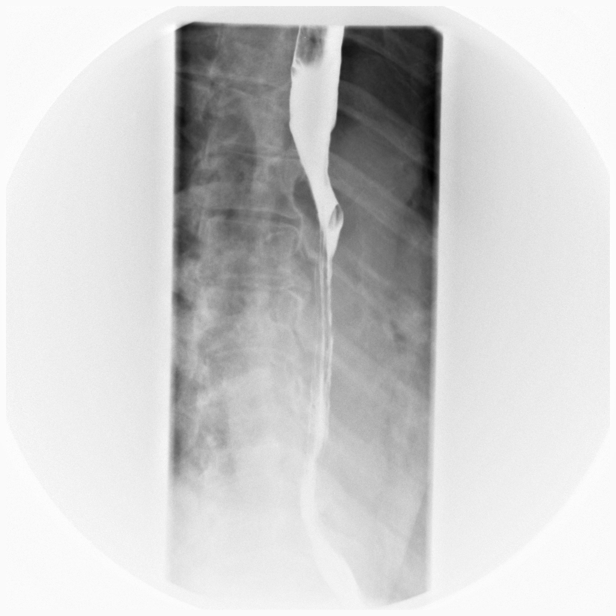

[Series 7: run · 1 of 1 slices shown (5 of 14)]
[im 1/1]
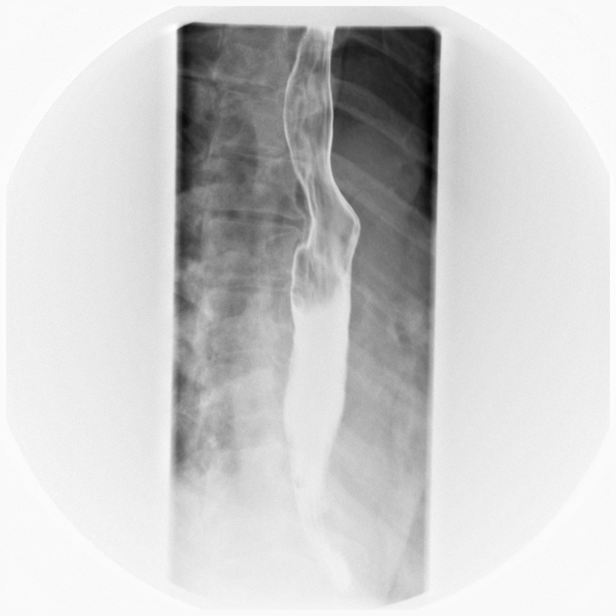

[Series 9: run · 1 of 1 slices shown (6 of 14)]
[im 1/1]
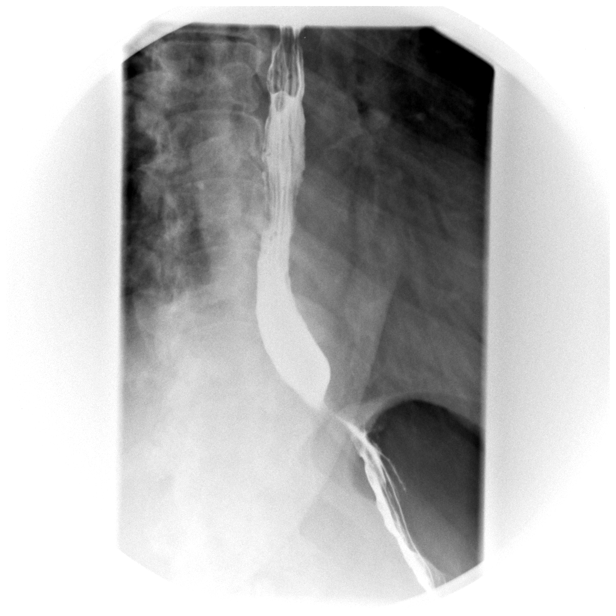

[Series 11: run · 1 of 1 slices shown (7 of 14)]
[im 1/1]
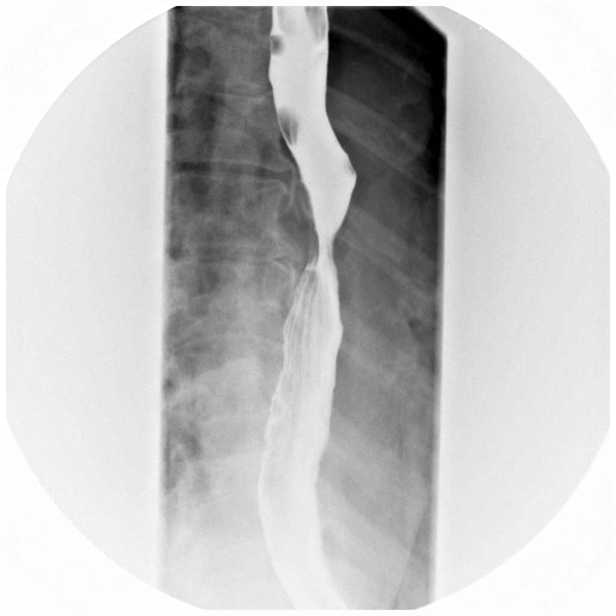

[Series 12: run · 1 of 1 slices shown (8 of 14)]
[im 1/1]
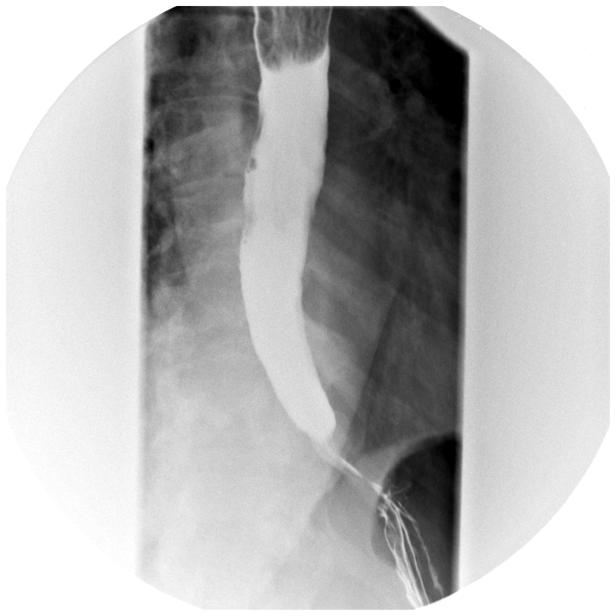

[Series 14: run · 1 of 1 slices shown (9 of 14)]
[im 1/1]
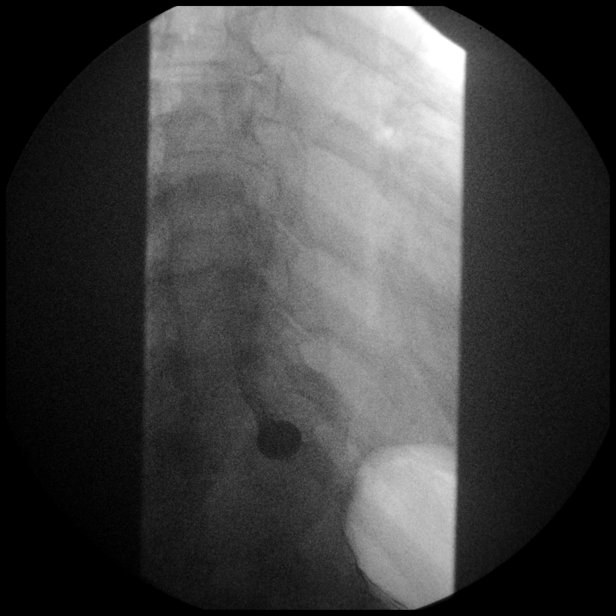

[Series 16: run · 1 of 1 slices shown (10 of 14)]
[im 1/1]
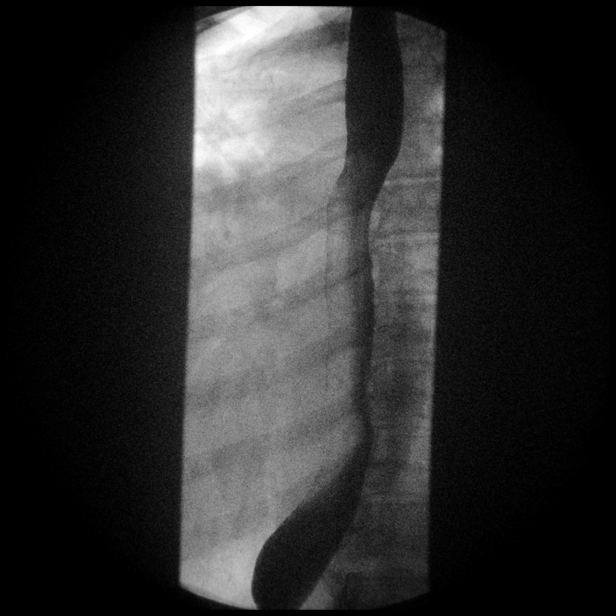

[Series 18: run · 1 of 1 slices shown (11 of 14)]
[im 1/1]
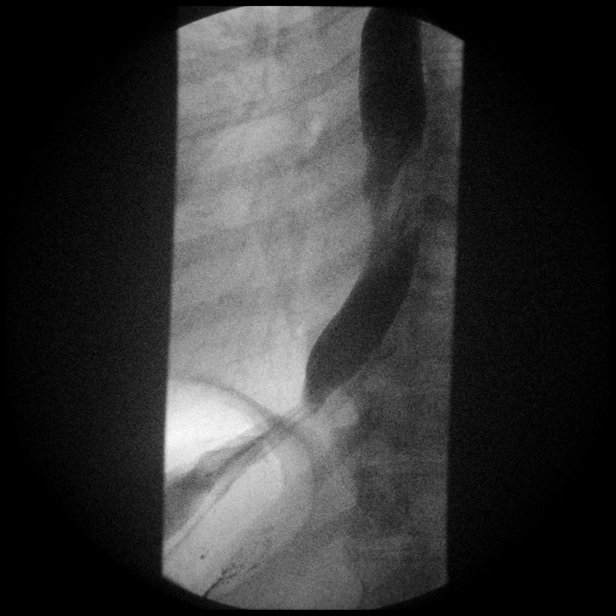

[Series 19: run · 1 of 1 slices shown (12 of 14)]
[im 1/1]
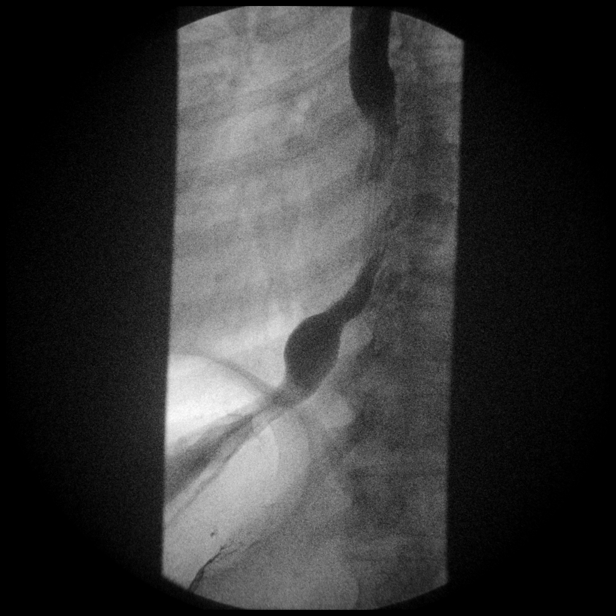

[Series 21: run · 1 of 1 slices shown (13 of 14)]
[im 1/1]
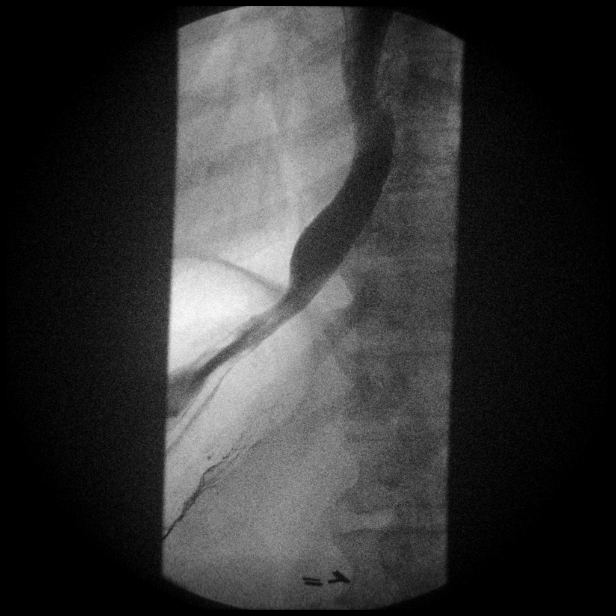

[Series 23: run · 1 of 1 slices shown (14 of 14)]
[im 1/1]
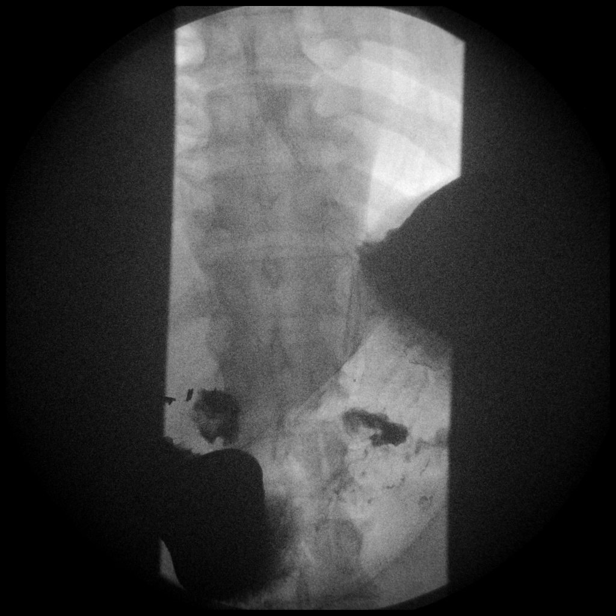

[14 of 24 positions shown; findings below may reference images not displayed]

FINDINGS: The pharynx is unremarkable.

Normal primary esophageal contractions are noted.
There are no fixed filling defects, areas of fixed narrowing or
mucosal abnormalities.
The barium tablet passes through the esophagus into the stomach
without obstruction.

A small amount of gastroesophageal reflux is noted during the
study.
There is no evidence of hiatal hernia.
IMPRESSION: Gastroesophageal reflux, otherwise unremarkable exam.

## 2013-05-21 ENCOUNTER — Telehealth: Payer: Self-pay | Admitting: Neurology

## 2013-05-21 NOTE — Telephone Encounter (Signed)
I called [facility].  Spoke with [employer].  She says the patient was shipped PFS (Prefilled Syringes) so no separate syringe would be needed.  She placed me on hold for several minutes while she consulted the pharmacist, who agreed no Rx for separate syringes are needed.  They said they will ship the patient a new order (perhaps it was a defective box?).  She stated they will call the patient back and nothing further is needed on our end.

## 2013-05-21 NOTE — Telephone Encounter (Signed)
Patient says she received her Avonex medication in the mail but she needs a new script in because it did not come with her needles. Please call the patient. Patient states she needs a new script sent in to [facility] so she can get her needles.

## 2013-06-10 ENCOUNTER — Ambulatory Visit (INDEPENDENT_AMBULATORY_CARE_PROVIDER_SITE_OTHER): Payer: 59 | Admitting: Nurse Practitioner

## 2013-06-10 ENCOUNTER — Encounter: Payer: Self-pay | Admitting: Nurse Practitioner

## 2013-06-10 VITALS — BP 146/84 | HR 72 | Ht 62.0 in | Wt 249.0 lb

## 2013-06-10 DIAGNOSIS — R269 Unspecified abnormalities of gait and mobility: Secondary | ICD-10-CM

## 2013-06-10 DIAGNOSIS — G35 Multiple sclerosis: Secondary | ICD-10-CM

## 2013-06-10 DIAGNOSIS — Z79899 Other long term (current) drug therapy: Secondary | ICD-10-CM

## 2013-06-10 NOTE — Patient Instructions (Signed)
Continue Avonex weekly does not need refills Check labs today Continue chair exercises Use walker at all times for safe ambulation

## 2013-06-10 NOTE — Progress Notes (Signed)
[facility]  PATIENT: LESSLIE MOSSA DOB: 1950/08/10   REASON FOR VISIT: follow up for MS   HISTORY OF PRESENT ILLNESS: Ms. Pollok, 63 year old female returns for followup. She has a history of MS and a gait disorder. She is currently on Avonex weekly and denies injection site problems. She pretreats with Aleve .She is using a walker today and says she has had one fall in the last 6 months. She recently completed 2 months of rehabilitation and claims  she is doing chair exercises 3 times a day. She feels that physical therapy was very beneficial .She complains with aching muscles and joint pain. MRI of the brain done 12/19/2012 with few scattered chronic demyelinating plaques mainly in the right hemisphere. No significant change from her MRI December 2008. She denies any new weakness sensory changes speech or swallowing issues. She is due to see her ophthalmologist next week for eye exam.   HISTORY:right-handed black female with a history of obesity, diabetes, multiple sclerosis, and a gait disorder. The patient tends to space out her revisit over a long durations, the patient was last seen through this office in November of 2012. The patient has been on Avonex, and she is tolerating the medication well. The patient indicates that she was in the hospital in December 2013 with congestive heart failure and pneumonia. The patient has had some alteration in her ability to ambulate since that time, and she has not gotten back to baseline. The patient had home health physical therapy, and she has been using a walker. The patient is still falling on occasion. The patient feels as if both legs are weak. The patient has some numbness in the hands, not the legs. The patient indicates that she has some problems with the bladder, and she is on Detrol for this. The patient had a MRI scan of the head since 2007. The patient returns for an evaluation.   REVIEW OF SYSTEMS: Full 14 system review  of systems performed and notable only for those listed, all others are neg:  Constitutional: N/A  Cardiovascular: N/A  Ear/Nose/Throat: N/A  Skin: N/A  Eyes: N/A  Respiratory: N/A  Gastroitestinal: N/A  Hematology/Lymphatic: N/A  Endocrine: heat and cold intolerance Musculoskeletal:joint pain, aching muscles, walking difficulty Allergy/Immunology: N/A  Neurological: N/A Psychiatric: N/A   ALLERGIES: Allergies  Allergen Reactions  . Codeine Other (See Comments)    unknown  . Other     Poppy seeds.  . Penicillins Other (See Comments)    unknown  . Tape     Pink tape    HOME MEDICATIONS: Outpatient Prescriptions Prior to Visit  Medication Sig Dispense Refill  . amLODipine (NORVASC) 10 MG tablet Take 1 tablet (10 mg total) by mouth daily.  90 tablet  3  . aspirin 81 MG tablet Take 81 mg by mouth daily.      Marland Kitchen atorvastatin (LIPITOR) 20 MG tablet Take 20 mg by mouth daily.      Marland Kitchen dextromethorphan-guaiFENesin (DIABETIC TUSSIN DM) 10-100 MG/5ML liquid Take 5 mLs by mouth every 4 (four) hours as needed for cough.      . fluticasone (FLONASE) 50 MCG/ACT nasal spray Place 2 sprays into the nose daily.  1 g  0  . furosemide (LASIX) 40 MG tablet Take 1 tablet (40 mg total) by mouth daily.  90 tablet  3  . hydrALAZINE (APRESOLINE) 50 MG tablet Take 1 tablet (50 mg total) by mouth 2 (two) times daily.  180 tablet  3  .  insulin lispro (HUMALOG) 100 UNIT/ML injection Inject 30 Units into the skin daily.      . interferon beta-1a (AVONEX) 30 MCG/0.5ML injection Inject 0.5 mLs (30 mcg total) into the muscle every 7 (seven) days.  1 kit  5  . losartan (COZAAR) 100 MG tablet Take 1 tablet (100 mg total) by mouth daily.  90 tablet  3  . metFORMIN (GLUCOPHAGE-XR) 750 MG 24 hr tablet Take 750 mg by mouth 2 (two) times daily.      . metoprolol (LOPRESSOR) 100 MG tablet Take 1 tablet (100 mg total) by mouth 2 (two) times daily.  180 tablet  3  . pantoprazole (PROTONIX) 40 MG tablet Take 1 tablet by  mouth daily  90 tablet  3  . traZODone (DESYREL) 100 MG tablet Take 2 tablets (200 mg total) by mouth at bedtime as needed.  180 tablet  1  . insulin glargine (LANTUS) 100 UNIT/ML injection Inject 40 Units into the skin at bedtime.       No facility-administered medications prior to visit.    PAST MEDICAL HISTORY: Past Medical History  Diagnosis Date  . Diabetes mellitus   . Hypertension 05/07/2012    Normal 2D Echo; Renal Arterial Doppler 02/02/2007 - less than 60% diameter reduction, left proximal renal artery  . Asthma   . COPD (chronic obstructive pulmonary disease)   . Multiple sclerosis   . Goiter   . Hypertensive hypertrophic cardiomyopathy, by echo 05/07/12 05/08/2012  . Dyspnea 12/12/2005    CHF and Unspecified Chest pain - Myocardial Perfusion - post stress EF-72, negative for ischemia  . Hx of cardiac catheterization     normal cardiac cath  . Chronic diastolic HF (heart failure) 10/17/2012  . H/O cardiac catheterization 10/17/2012  . Hypothyroidism   . Depression   . Dyslipidemia   . Chronic low back pain   . Peripheral edema   . Neurogenic bladder   . Obesity   . Gait disorder     PAST SURGICAL HISTORY: Past Surgical History  Procedure Laterality Date  . Abdominal hysterectomy  Nov 2005  . Cardiac catheterization Left 05/06/2012    Moderate caliber vessel from mid LAD  . Cardiac catheterization Right 04/02/2007    Left ventricular hypertrophy, no coronary disease, no renal artery stenosis    FAMILY HISTORY: Family History  Problem Relation Age of Onset  . Coronary artery disease Father 38    MI  . Diabetes Father   . Heart disease Mother   . Diabetes Mother     SOCIAL HISTORY: History   Social History  . Marital Status: Married    Spouse Name: Johnny    Number of Children: 3  . Years of Education: 14   Occupational History  . Not on file.   Social History Main Topics  . Smoking status: Former Smoker    Types: Cigarettes    Quit date: 10/24/2003   . Smokeless tobacco: Never Used  . Alcohol Use: No  . Drug Use: No  . Sexual Activity: Not on file   Other Topics Concern  . Not on file   Social History Narrative   Patient is married Biomedical engineer) and lives with her husband and her sister-in-law.   Patient has three children.   Patient is disabled.   Patient has a college education.   Patient is right-handed.   Patient does not drink any caffeine.     PHYSICAL EXAM  Filed Vitals:   06/10/13 1421  BP: 146/84  Pulse: 72  Height: _0  (1.575 m)  Weight: 249 lb (112.946 kg)   Body mass index is 45.53 kg/(m^2).  Generalized: Well developed, morbidly obese,  in no acute distress  Skin 1 + edema bilaterally at the ankles  Neurological examination   Mentation: Alert oriented to time, place, history taking. Follows all commands speech and language fluent  Cranial nerve II-XII: Extraocular movements were full in the left eye, there is an exotropia on the right, incomplete abduction of the eye. Visual fields were full on confrontational test. Facial sensation and strength were normal. hearing was intact to finger rubbing bilaterally. Uvula tongue midline. head turning and shoulder shrug were normal and symmetric.Tongue protrusion into cheek strength was normal. Motor: normal bulk and tone, full strength in the BUE, BLE, fine finger movements normal, no pronator drift. No focal weakness Coordination: finger-nose-finger, heel-to-shin bilaterally, no dysmetria Reflexes: Brachioradialis 2/2, biceps 2/2, triceps 2/2, patellar 2/2, Achilles 2/2, plantar responses were flexor bilaterally. Gait and Station: Rising up from seated position without assistance, wide based stance,   smooth turning ambulates with a rolling walker. Tandem gait is unsteady.  DIAGNOSTIC DATA (LABS, IMAGING, TESTING) - I reviewed patient records, labs, notes, testing and imaging myself where available.  Lab Results  Component Value Date   WBC 12.4* 12/05/2012   HGB  13.4 12/05/2012   HCT 41.1 12/05/2012   MCV 92 12/05/2012   PLT 141* 12/05/2012      Component Value Date/Time   NA 139 12/05/2012 1535   NA 140 10/17/2012 1502   K 3.9 12/05/2012 1535   CL 98 12/05/2012 1535   CO2 27 12/05/2012 1535   GLUCOSE 283* 12/05/2012 1535   GLUCOSE 195* 10/17/2012 1502   BUN 13 12/05/2012 1535   BUN 12 10/17/2012 1502   CREATININE 0.95 12/05/2012 1535   CREATININE 0.94 10/17/2012 1502   CALCIUM 9.4 12/05/2012 1535   PROT 7.7 12/05/2012 1535   PROT 6.9 05/07/2012 1712   ALBUMIN 2.9* 05/07/2012 1712   AST 10 12/05/2012 1535   ALT 15 12/05/2012 1535   ALKPHOS 125* 12/05/2012 1535   BILITOT 0.6 12/05/2012 1535   GFRNONAA 65 12/05/2012 1535   GFRAA 75 12/05/2012 1535     Lab Results  Component Value Date   VITAMINB12 850 12/05/2012   Lab Results  Component Value Date   TSH 0.886 12/05/2012      ASSESSMENT AND PLAN  64 y.o. year old female  has a past medical history of multiple sclerosis currently on Avonex. Last MRI of the brain 12/19/2012 with few scattered chronic demyelinating plaques mainly in the right hemisphere. No significant change from MRI December 2008. Gait abnormality with one fall in the last 6 months.  Continue Avonex weekly does not need refills Check labs today to  monitor for adverse effects of long term use of Avonex Continue chair exercises 3 times daily Use walker at all times for safe ambulation Dennie Bible, [facility] [place], [facility], APRN  [facility] [address] [place], [place] [address] [phone]

## 2013-06-10 NOTE — Progress Notes (Signed)
I have read the note, and I agree with the clinical assessment and plan.  Indie Boehne KEITH   

## 2013-06-11 LAB — CBC WITH DIFFERENTIAL/PLATELET
BASOS: 0 %
Basophils Absolute: 0 10*3/uL (ref 0.0–0.2)
EOS: 2 %
Eosinophils Absolute: 0.2 10*3/uL (ref 0.0–0.4)
HEMATOCRIT: 43.7 % (ref 34.0–46.6)
Hemoglobin: 14.2 g/dL (ref 11.1–15.9)
Immature Grans (Abs): 0 10*3/uL (ref 0.0–0.1)
Immature Granulocytes: 0 %
LYMPHS ABS: 2.3 10*3/uL (ref 0.7–3.1)
Lymphs: 22 %
MCH: 29.7 pg (ref 26.6–33.0)
MCHC: 32.5 g/dL (ref 31.5–35.7)
MCV: 91 fL (ref 79–97)
MONOS ABS: 0.6 10*3/uL (ref 0.1–0.9)
Monocytes: 6 %
NEUTROS ABS: 7.2 10*3/uL — AB (ref 1.4–7.0)
Neutrophils Relative %: 70 %
RBC: 4.78 x10E6/uL (ref 3.77–5.28)
RDW: 14.4 % (ref 12.3–15.4)
WBC: 10.4 10*3/uL (ref 3.4–10.8)

## 2013-06-11 LAB — COMPREHENSIVE METABOLIC PANEL
A/G RATIO: 1.2 (ref 1.1–2.5)
ALK PHOS: 121 IU/L — AB (ref 39–117)
ALT: 20 IU/L (ref 0–32)
AST: 15 IU/L (ref 0–40)
Albumin: 4 g/dL (ref 3.6–4.8)
BUN / CREAT RATIO: 13 (ref 11–26)
BUN: 12 mg/dL (ref 8–27)
CALCIUM: 9.4 mg/dL (ref 8.6–10.2)
CO2: 23 mmol/L (ref 18–29)
CREATININE: 0.96 mg/dL (ref 0.57–1.00)
Chloride: 93 mmol/L — ABNORMAL LOW (ref 97–108)
GFR calc Af Amer: 74 mL/min/{1.73_m2} (ref >59)
GFR, EST NON AFRICAN AMERICAN: 64 mL/min/{1.73_m2} (ref >59)
GLOBULIN, TOTAL: 3.4 g/dL (ref 1.5–4.5)
Glucose: 476 mg/dL — ABNORMAL HIGH (ref 65–99)
Potassium: 4.2 mmol/L (ref 3.5–5.2)
SODIUM: 139 mmol/L (ref 134–144)
Total Bilirubin: 0.7 mg/dL (ref 0.0–1.2)
Total Protein: 7.4 g/dL (ref 6.0–8.5)

## 2013-06-12 NOTE — Progress Notes (Signed)
Quick Note:  Spoke to patient and relayed lab results, per [facility]. She is seeing her diabetic doctor next week. ______

## 2013-07-16 ENCOUNTER — Other Ambulatory Visit: Payer: Self-pay | Admitting: Internal Medicine

## 2013-07-16 DIAGNOSIS — E049 Nontoxic goiter, unspecified: Secondary | ICD-10-CM

## 2013-07-22 ENCOUNTER — Ambulatory Visit (HOSPITAL_COMMUNITY): Payer: 59

## 2013-08-07 ENCOUNTER — Ambulatory Visit (HOSPITAL_COMMUNITY)
Admission: RE | Admit: 2013-08-07 | Discharge: 2013-08-07 | Disposition: A | Discharge status: Home / Self Care | Payer: 59 | Source: Ambulatory Visit | Attending: Internal Medicine | Admitting: Internal Medicine

## 2013-08-07 DIAGNOSIS — E049 Nontoxic goiter, unspecified: Secondary | ICD-10-CM

## 2013-08-07 DIAGNOSIS — E042 Nontoxic multinodular goiter: Secondary | ICD-10-CM | POA: Insufficient documentation

## 2013-08-26 ENCOUNTER — Ambulatory Visit (INDEPENDENT_AMBULATORY_CARE_PROVIDER_SITE_OTHER): Payer: 59 | Admitting: General Surgery

## 2013-08-26 ENCOUNTER — Encounter (INDEPENDENT_AMBULATORY_CARE_PROVIDER_SITE_OTHER): Payer: Self-pay | Admitting: General Surgery

## 2013-08-26 VITALS — BP 124/78 | HR 82 | Temp 98.7°F | Resp 16 | Ht 61.0 in | Wt 250.6 lb

## 2013-08-26 DIAGNOSIS — E049 Nontoxic goiter, unspecified: Secondary | ICD-10-CM

## 2013-08-26 DIAGNOSIS — E040 Nontoxic diffuse goiter: Secondary | ICD-10-CM

## 2013-08-26 NOTE — Patient Instructions (Signed)
We will arrange for the needle biopsy and call you with the result.

## 2013-08-26 NOTE — Progress Notes (Signed)
Patient ID: [name] Fry, female   DOB: 05-27-1951, 63 y.o.   MRN: [id number]  Chief Complaint  Patient presents with  . New Evaluation    eval thyroid nodule    HPI [name] Fry is a 63 y.o. female.   HPI  She is referred by Dr. Carlis Abbott for further evaluation of a thyroid nodule. She has a multinodular goiter that has been there for many years. She was treated with suppression therapy. However, one nodule in the isthmus has been growing. She has mild dysphagia. No history of ionizing radiation exposure. No pain from the area.  Past Medical History  Diagnosis Date  . Diabetes mellitus   . Hypertension 05/07/2012    Normal 2D Echo; Renal Arterial Doppler 02/02/2007 - less than 60% diameter reduction, left proximal renal artery  . Asthma   . COPD (chronic obstructive pulmonary disease)   . Multiple sclerosis   . Goiter   . Hypertensive hypertrophic cardiomyopathy, by echo 05/07/12 05/08/2012  . Dyspnea 12/12/2005    CHF and Unspecified Chest pain - Myocardial Perfusion - post stress EF-72, negative for ischemia  . Hx of cardiac catheterization     normal cardiac cath  . Chronic diastolic HF (heart failure) 10/17/2012  . H/O cardiac catheterization 10/17/2012  . Hypothyroidism   . Depression   . Dyslipidemia   . Chronic low back pain   . Peripheral edema   . Neurogenic bladder   . Obesity   . Gait disorder     Past Surgical History  Procedure Laterality Date  . Abdominal hysterectomy  Nov 2005  . Cardiac catheterization Left 05/06/2012    Moderate caliber vessel from mid LAD  . Cardiac catheterization Right 04/02/2007    Left ventricular hypertrophy, no coronary disease, no renal artery stenosis  . Cesarean section       3x -[id number]  . Cholecystectomy      1973  . Breast surgery      2 tumors removed -right breast  . Appendectomy      1973  . Joint replacement       right shoulder 1985    Family History  Problem Relation Age of Onset  . Coronary artery disease  Father 43    MI  . Diabetes Father   . Heart disease Mother   . Diabetes Mother     Social History History  Substance Use Topics  . Smoking status: Former Smoker    Types: Cigarettes    Quit date: 10/24/2003  . Smokeless tobacco: Never Used  . Alcohol Use: No    Allergies  Allergen Reactions  . Codeine Other (See Comments)    unknown  . Other     Poppy seeds.  . Penicillins Other (See Comments)    unknown  . Tape     Pink tape    Current Outpatient Prescriptions  Medication Sig Dispense Refill  . amLODipine (NORVASC) 10 MG tablet Take 1 tablet (10 mg total) by mouth daily.  90 tablet  3  . aspirin 81 MG tablet Take 81 mg by mouth daily.      Marland Kitchen atorvastatin (LIPITOR) 20 MG tablet Take 20 mg by mouth daily.      . fluticasone (FLONASE) 50 MCG/ACT nasal spray Place 2 sprays into the nose daily.  1 g  0  . furosemide (LASIX) 40 MG tablet Take 1 tablet (40 mg total) by mouth daily.  90 tablet  3  . hydrALAZINE (APRESOLINE) 50 MG tablet  Take 1 tablet (50 mg total) by mouth 2 (two) times daily.  180 tablet  3  . insulin lispro (HUMALOG) 100 UNIT/ML injection Inject 30 Units into the skin daily.      . interferon beta-1a (AVONEX) 30 MCG/0.5ML injection Inject 0.5 mLs (30 mcg total) into the muscle every 7 (seven) days.  1 kit  5  . losartan (COZAAR) 100 MG tablet Take 1 tablet (100 mg total) by mouth daily.  90 tablet  3  . metFORMIN (GLUCOPHAGE-XR) 750 MG 24 hr tablet Take 750 mg by mouth 2 (two) times daily.      . metoprolol (LOPRESSOR) 100 MG tablet Take 1 tablet (100 mg total) by mouth 2 (two) times daily.  180 tablet  3  . pantoprazole (PROTONIX) 40 MG tablet Take 1 tablet by mouth daily  90 tablet  3  . Pregabalin (LYRICA PO) Take 80 mg by mouth.      . traZODone (DESYREL) 100 MG tablet Take 2 tablets (200 mg total) by mouth at bedtime as needed.  180 tablet  1  . insulin glargine (LANTUS) 100 UNIT/ML injection Inject 40 Units into the skin at bedtime.       No current  facility-administered medications for this visit.    Review of Systems Review of Systems  HENT: Positive for trouble swallowing.   Eyes: Negative.   Respiratory: Positive for wheezing.   Cardiovascular: Negative.   Gastrointestinal: Negative.   Musculoskeletal: Positive for arthralgias.  Neurological: Positive for weakness.    Blood pressure 124/78, pulse 82, temperature 98.7 F (37.1 C), resp. rate 16, height _0  (1.549 m), weight 250 lb 9.6 oz (113.671 kg).  Physical Exam Physical Exam  Constitutional:  Morbidly obese female in NAD.  She uses a rolling walker.  HENT:  Head: Normocephalic and atraumatic.  Eyes: No scleral icterus.  Rightward gaze with right eye.  Neck: Neck supple. Thyromegaly: There is a soft, 2-3 cm nodule centrally.  Cardiovascular: Normal rate and regular rhythm.   Pulmonary/Chest: Effort normal and breath sounds normal.  Abdominal: Soft. She exhibits no distension and no mass. There is no tenderness.  Right subcostal scar.  Musculoskeletal: She exhibits no edema.  Lymphadenopathy:    She has no cervical adenopathy.  Neurological: She is alert.  Skin: Skin is warm and dry.    Data Reviewed Note from Dr. Carlis Abbott.  THYROID ULTRASOUND  TECHNIQUE:  Ultrasound examination of the thyroid gland and adjacent soft  tissues was performed.  COMPARISON: 10/28/2010 and earlier.  FINDINGS:  Right thyroid lobe  Measurements: 6.7 x 2.4 x 3.0 cm (previously 6.3 x 2.3 x 2.8 cm).  Diffusely and chronically heterogeneous echotexture. Chronic upper  pole solid nodules measure up to 2.2 cm in size (previously 1.8 cm)  and demonstrate coarse shadowing calcification as before (image 31  today versus image 30 previously).  Left thyroid lobe  Measurements: 7.5 x 2.7 x 3.7 cm (previously 7.3 x 2.3 x 3.4 cm).  Diffusely and chronically heterogeneous echotexture. No discrete  nodule.  Isthmus  Thickness: 3 mm (unchanged). Diffusely and chronically heterogeneous   echotexture. The dominant nodule of this gland is situated within  the isthmus and is partially cystic and solid measuring up to 4.6 cm  diameter (images 39- 43). This appears to be new since 2012 (image  15 today versus image 15 at that time).  Lymphadenopathy  None visualized.  IMPRESSION:  Chronic multinodular goiter largely stable in appearance since 2012.  In the isthmus  a solid and cystic nodule has developed measuring up  to 4.6 cm.      Assessment    Chronic multinodular goiter now with enlargement or goiter at the isthmus. This is despite suppression therapy.     Plan    Fine-needle aspiration to see if this is malignant. Because she has some compressive symptoms (dysphagia), we did talk about total thyroidectomy.   The procedure and risks of thyroid surgery have been discussed.  Risks include but not limited to bleeding, infection, wound healing problems, presence of scar, anesthesia, injury to recurrent laryngeal nerve and permanent hoarseness, permanent hypocalcemia, voice changes, dysphagia, need for reoperation.  Will speak with her once we get the results of the fine-needle aspiration.       [name] Fry J 08/26/2013, 5:06 PM

## 2013-08-27 ENCOUNTER — Other Ambulatory Visit (INDEPENDENT_AMBULATORY_CARE_PROVIDER_SITE_OTHER): Payer: Self-pay | Admitting: General Surgery

## 2013-08-27 DIAGNOSIS — E041 Nontoxic single thyroid nodule: Secondary | ICD-10-CM

## 2013-08-28 ENCOUNTER — Other Ambulatory Visit (INDEPENDENT_AMBULATORY_CARE_PROVIDER_SITE_OTHER): Payer: Self-pay | Admitting: General Surgery

## 2013-08-28 DIAGNOSIS — E041 Nontoxic single thyroid nodule: Secondary | ICD-10-CM

## 2013-09-10 ENCOUNTER — Ambulatory Visit
Admission: RE | Admit: 2013-09-10 | Discharge: 2013-09-10 | Disposition: A | Discharge status: Home / Self Care | Payer: 59 | Source: Ambulatory Visit | Attending: General Surgery | Admitting: General Surgery

## 2013-09-10 ENCOUNTER — Other Ambulatory Visit (INDEPENDENT_AMBULATORY_CARE_PROVIDER_SITE_OTHER): Payer: Self-pay | Admitting: General Surgery

## 2013-09-10 DIAGNOSIS — E041 Nontoxic single thyroid nodule: Secondary | ICD-10-CM

## 2013-09-11 ENCOUNTER — Encounter (INDEPENDENT_AMBULATORY_CARE_PROVIDER_SITE_OTHER): Payer: Self-pay | Admitting: General Surgery

## 2013-09-11 ENCOUNTER — Encounter (INDEPENDENT_AMBULATORY_CARE_PROVIDER_SITE_OTHER): Payer: Self-pay

## 2013-09-11 NOTE — Progress Notes (Signed)
Patient ID: [name] Fry, female   DOB: Dec 10, 1950, 63 y.o.   MRN: [id number] She went for her ultrasound-guided biopsy but no discrete nodule could be found to biopsy. Please see results below. She is interested in proceeding with thyroidectomy because she's having compressive symptoms and the goiter is getting larger. She will need preoperative cardiac clearance and she sees [name].  We will request a preoperative cardiac assessment from him. If it is favorable, we can proceed with scheduling her total thyroidectomy.   FINDINGS:  Careful real-time examination was performed of the entire thyroid  gland prior to potential ultrasound-guided thyroid biopsy. On the  right side, the right lobe is diffusely heterogeneous in appearance.  What was measured previously as a nodule is not a reproducible  discretely marginated nodule. In this region, there are multiple  areas of shadowing dystrophic calcification and no vascular  delineation of a truly discrete nodule. One region measures  approximately 1.7 cm in diameter and corresponds to the 1.8 cm area  of measurement obtained on a prior ultrasound in 2012. I cannot  reproduce a larger nodule and there is no discrete nodule in the  right lobe to warrant biopsy.  The thyroid isthmus is diffusely enlarged and heterogeneous in  appearance and contiguous with an enlarged left lobe. There is  absolutely no discretely marginated mass identified by ultrasound.  What was measured previously is simply diffusely enlarged tissue  which currently measures approximately 2.6 cm in thickness in the  midline. Color Doppler imaging shows no vascular margination of a  nodule. No abnormal calcifications are identified.  IMPRESSION:  There is currently no indication for thyroid biopsy by ultrasound.  Careful examination of the thyroid gland shows a diffusely enlarged  gland with no discrete marginated nodules. No reproducible right  thyroid nodule is identified.  The thyroid isthmus is diffusely  enlarged and contiguous with an enlarged left lobe with no discrete  nodule identified.

## 2013-09-17 ENCOUNTER — Telehealth: Payer: Self-pay | Admitting: *Deleted

## 2013-09-17 NOTE — Telephone Encounter (Signed)
Dr Allyson Sabal reviewed the chart and gave cardiac clearance to proceed with the thyroidectomy.  Letter drafted and faxed

## 2013-09-23 ENCOUNTER — Other Ambulatory Visit (INDEPENDENT_AMBULATORY_CARE_PROVIDER_SITE_OTHER): Payer: Self-pay | Admitting: General Surgery

## 2013-10-09 ENCOUNTER — Encounter (INDEPENDENT_AMBULATORY_CARE_PROVIDER_SITE_OTHER): Payer: Self-pay

## 2013-10-16 ENCOUNTER — Other Ambulatory Visit: Payer: Self-pay | Admitting: Neurology

## 2013-10-18 ENCOUNTER — Encounter (HOSPITAL_COMMUNITY): Payer: Self-pay | Admitting: Pharmacy Technician

## 2013-10-20 ENCOUNTER — Other Ambulatory Visit: Payer: Self-pay | Admitting: Cardiovascular Disease

## 2013-10-21 NOTE — Progress Notes (Signed)
Cardiac clearance note dr berry on chart and in epic 09-17-13 under procedures lov dr berry 04-15-13 epic lov nancy martin np neurology 06-10-12 epic Chest xray 2 view 01-10-13 epic Echo 05-07-12 ep[ic ekg 04-15-13 epic

## 2013-10-21 NOTE — Patient Instructions (Addendum)
20 [name] Fry  10/21/2013   Your procedure is scheduled on: Friday May 29th, 2015  Report to [facility] Main Entrance and follow signs to  Short Stay Center at  930 AM.  Call this number if you have problems the morning of surgery [phone]   Remember:  Do not eat food or drink liquids :After Midnight.     Take these medicines the morning of surgery with A SIP OF WATER: AMLODIPINE, FLONASE NASAL SPRAY, METOPROLOL, TAKE 1/2 DOSE BEDTIME LANTUS INSULIN ON 10-31-2013.                               You may not have any metal on your body including hair pins and piercings  Do not wear jewelry, make-up, lotions, powders, or deodorant.   Men may shave face and neck.  Do not bring valuables to the hospital. [facility] IS NOT RESPONSIBLE FOR VALUABLES.  Contacts, dentures or bridgework may not be worn into surgery.  Leave suitcase in the car. After surgery it may be brought to your room.  For patients admitted to the hospital, checkout time is 11:00 AM the day of discharge.   Patients discharged the day of surgery will not be allowed to drive home.  Name and phone number of your driver:  Special Instructions: N/A  ________________________________________________________________________  [facility] - Preparing for Surgery Before surgery, you can play an important role.  Because skin is not sterile, your skin needs to be as free of germs as possible.  You can reduce the number of germs on your skin by washing with CHG (chlorahexidine gluconate) soap before surgery.  CHG is an antiseptic cleaner which kills germs and bonds with the skin to continue killing germs even after washing. Please DO NOT use if you have an allergy to CHG or antibacterial soaps.  If your skin becomes reddened/irritated stop using the CHG and inform your nurse when you arrive at Short Stay. Do not shave (including legs and underarms) for at least 48 hours prior to the first CHG shower.  You may shave your  face. Please follow these instructions carefully:  1.  Shower with CHG Soap the night before surgery and the  morning of Surgery.  2.  If you choose to wash your hair, wash your hair first as usual with your  normal  shampoo.  3.  After you shampoo, rinse your hair and body thoroughly to remove the  shampoo.                           4.  Use CHG as you would any other liquid soap.  You can apply chg directly  to the skin and wash                       Gently with a scrungie or clean washcloth.  5.  Apply the CHG Soap to your body ONLY FROM THE NECK DOWN.   Do not use on open                           Wound or open sores. Avoid contact with eyes, ears mouth and genitals (private parts).                        Genitals (private parts) with  your normal soap.             6.  Wash thoroughly, paying special attention to the area where your surgery  will be performed.  7.  Thoroughly rinse your body with warm water from the neck down.  8.  DO NOT shower/wash with your normal soap after using and rinsing off  the CHG Soap.                9.  Pat yourself dry with a clean towel.            10.  Wear clean pajamas.            11.  Place clean sheets on your bed the night of your first shower and do not  sleep with pets. Day of Surgery : Do not apply any lotions/deodorants the morning of surgery.  Please wear clean clothes to the hospital/surgery center.  FAILURE TO FOLLOW THESE INSTRUCTIONS MAY RESULT IN THE CANCELLATION OF YOUR SURGERY PATIENT SIGNATURE_________________________________  NURSE SIGNATURE__________________________________  ________________________________________________________________________

## 2013-10-21 NOTE — Telephone Encounter (Signed)
Rx was sent to pharmacy electronically. 

## 2013-10-23 ENCOUNTER — Encounter (HOSPITAL_COMMUNITY)
Admission: RE | Admit: 2013-10-23 | Discharge: 2013-10-23 | Disposition: A | Discharge status: Home / Self Care | Payer: 59 | Source: Ambulatory Visit | Attending: General Surgery | Admitting: General Surgery

## 2013-10-23 ENCOUNTER — Encounter (HOSPITAL_COMMUNITY): Payer: Self-pay

## 2013-10-23 DIAGNOSIS — Z01812 Encounter for preprocedural laboratory examination: Secondary | ICD-10-CM | POA: Insufficient documentation

## 2013-10-23 HISTORY — DX: Adverse effect of unspecified anesthetic, initial encounter: T41.45XA

## 2013-10-23 HISTORY — DX: Gastro-esophageal reflux disease without esophagitis: K21.9

## 2013-10-23 HISTORY — DX: Other complications of anesthesia, initial encounter: T88.59XA

## 2013-10-23 HISTORY — DX: Pneumonia, unspecified organism: J18.9

## 2013-10-23 HISTORY — DX: Unspecified osteoarthritis, unspecified site: M19.90

## 2013-10-23 LAB — CBC WITH DIFFERENTIAL/PLATELET
BASOS PCT: 0 % (ref 0–1)
Basophils Absolute: 0 10*3/uL (ref 0.0–0.1)
EOS ABS: 0.3 10*3/uL (ref 0.0–0.7)
Eosinophils Relative: 3 % (ref 0–5)
HCT: 42.8 % (ref 36.0–46.0)
HEMOGLOBIN: 14.1 g/dL (ref 12.0–15.0)
LYMPHS PCT: 25 % (ref 12–46)
Lymphs Abs: 2.5 10*3/uL (ref 0.7–4.0)
MCH: 30 pg (ref 26.0–34.0)
MCHC: 32.9 g/dL (ref 30.0–36.0)
MCV: 91.1 fL (ref 78.0–100.0)
MONOS PCT: 8 % (ref 3–12)
Monocytes Absolute: 0.8 10*3/uL (ref 0.1–1.0)
Neutro Abs: 6.3 10*3/uL (ref 1.7–7.7)
Neutrophils Relative %: 64 % (ref 43–77)
Platelets: 105 10*3/uL — ABNORMAL LOW (ref 150–400)
RBC: 4.7 MIL/uL (ref 3.87–5.11)
RDW: 14.7 % (ref 11.5–15.5)
WBC: 9.9 10*3/uL (ref 4.0–10.5)

## 2013-10-23 LAB — COMPREHENSIVE METABOLIC PANEL
ALBUMIN: 3.4 g/dL — AB (ref 3.5–5.2)
ALT: 26 U/L (ref 0–35)
AST: 19 U/L (ref 0–37)
Alkaline Phosphatase: 131 U/L — ABNORMAL HIGH (ref 39–117)
BUN: 9 mg/dL (ref 6–23)
CHLORIDE: 97 meq/L (ref 96–112)
CO2: 27 mEq/L (ref 19–32)
Calcium: 9.5 mg/dL (ref 8.4–10.5)
Creatinine, Ser: 0.72 mg/dL (ref 0.50–1.10)
GFR calc Af Amer: >90 mL/min (ref >90)
GFR calc non Af Amer: >90 mL/min (ref >90)
Glucose, Bld: 412 mg/dL — ABNORMAL HIGH (ref 70–99)
POTASSIUM: 3.8 meq/L (ref 3.7–5.3)
Sodium: 139 mEq/L (ref 137–147)
TOTAL PROTEIN: 8.1 g/dL (ref 6.0–8.3)
Total Bilirubin: 0.5 mg/dL (ref 0.3–1.2)

## 2013-10-23 LAB — NO BLOOD PRODUCTS

## 2013-10-23 LAB — PROTIME-INR
INR: 0.89 (ref 0.00–1.49)
Prothrombin Time: 11.9 seconds (ref 11.6–15.2)

## 2013-10-24 NOTE — Progress Notes (Signed)
Cbc with dif,  cmet results routed to dr Abbey Chatters inbasket by epic

## 2013-10-25 ENCOUNTER — Telehealth (INDEPENDENT_AMBULATORY_CARE_PROVIDER_SITE_OTHER): Payer: Self-pay

## 2013-10-25 NOTE — Telephone Encounter (Signed)
Pt's pre op labs show a BS of 400+.  Per Dr. Abbey Chatters, I informed the pt that unless she was able to get this under control he would have to cancel her surgery.  Pt voiced understanding.

## 2013-10-25 NOTE — Telephone Encounter (Signed)
Noted  

## 2013-10-31 MED ORDER — SODIUM CHLORIDE 0.9 % IV SOLN
1500.0000 mg | INTRAVENOUS | Status: AC
  Administered 2013-11-01: 1500 mg via INTRAVENOUS
  Filled 2013-10-31: qty 1500

## 2013-11-01 ENCOUNTER — Observation Stay (HOSPITAL_COMMUNITY)
Admission: RE | Admit: 2013-11-01 | Discharge: 2013-11-03 | Disposition: A | Discharge status: Home / Self Care | Payer: 59 | Source: Ambulatory Visit | Attending: General Surgery | Admitting: General Surgery

## 2013-11-01 ENCOUNTER — Encounter (HOSPITAL_COMMUNITY)
Admission: RE | Disposition: A | Discharge status: Home / Self Care | Payer: Self-pay | Source: Ambulatory Visit | Attending: General Surgery

## 2013-11-01 ENCOUNTER — Ambulatory Visit (HOSPITAL_COMMUNITY): Payer: 59 | Admitting: Anesthesiology

## 2013-11-01 ENCOUNTER — Encounter (HOSPITAL_COMMUNITY): Payer: 59 | Admitting: Anesthesiology

## 2013-11-01 ENCOUNTER — Encounter (HOSPITAL_COMMUNITY): Payer: Self-pay | Admitting: *Deleted

## 2013-11-01 DIAGNOSIS — E039 Hypothyroidism, unspecified: Secondary | ICD-10-CM | POA: Insufficient documentation

## 2013-11-01 DIAGNOSIS — F3289 Other specified depressive episodes: Secondary | ICD-10-CM | POA: Insufficient documentation

## 2013-11-01 DIAGNOSIS — J449 Chronic obstructive pulmonary disease, unspecified: Secondary | ICD-10-CM | POA: Insufficient documentation

## 2013-11-01 DIAGNOSIS — K219 Gastro-esophageal reflux disease without esophagitis: Secondary | ICD-10-CM | POA: Insufficient documentation

## 2013-11-01 DIAGNOSIS — Z794 Long term (current) use of insulin: Secondary | ICD-10-CM | POA: Insufficient documentation

## 2013-11-01 DIAGNOSIS — F329 Major depressive disorder, single episode, unspecified: Secondary | ICD-10-CM | POA: Insufficient documentation

## 2013-11-01 DIAGNOSIS — J4489 Other specified chronic obstructive pulmonary disease: Secondary | ICD-10-CM | POA: Insufficient documentation

## 2013-11-01 DIAGNOSIS — Z7982 Long term (current) use of aspirin: Secondary | ICD-10-CM | POA: Insufficient documentation

## 2013-11-01 DIAGNOSIS — I422 Other hypertrophic cardiomyopathy: Secondary | ICD-10-CM | POA: Insufficient documentation

## 2013-11-01 DIAGNOSIS — N319 Neuromuscular dysfunction of bladder, unspecified: Secondary | ICD-10-CM | POA: Insufficient documentation

## 2013-11-01 DIAGNOSIS — Z79899 Other long term (current) drug therapy: Secondary | ICD-10-CM | POA: Insufficient documentation

## 2013-11-01 DIAGNOSIS — I5032 Chronic diastolic (congestive) heart failure: Secondary | ICD-10-CM | POA: Insufficient documentation

## 2013-11-01 DIAGNOSIS — Z87891 Personal history of nicotine dependence: Secondary | ICD-10-CM | POA: Insufficient documentation

## 2013-11-01 DIAGNOSIS — Z9089 Acquired absence of other organs: Secondary | ICD-10-CM | POA: Insufficient documentation

## 2013-11-01 DIAGNOSIS — E042 Nontoxic multinodular goiter: Principal | ICD-10-CM | POA: Insufficient documentation

## 2013-11-01 DIAGNOSIS — E049 Nontoxic goiter, unspecified: Secondary | ICD-10-CM

## 2013-11-01 DIAGNOSIS — Z96619 Presence of unspecified artificial shoulder joint: Secondary | ICD-10-CM | POA: Insufficient documentation

## 2013-11-01 DIAGNOSIS — E785 Hyperlipidemia, unspecified: Secondary | ICD-10-CM | POA: Insufficient documentation

## 2013-11-01 DIAGNOSIS — E119 Type 2 diabetes mellitus without complications: Secondary | ICD-10-CM | POA: Insufficient documentation

## 2013-11-01 DIAGNOSIS — I10 Essential (primary) hypertension: Secondary | ICD-10-CM | POA: Insufficient documentation

## 2013-11-01 DIAGNOSIS — G35 Multiple sclerosis: Secondary | ICD-10-CM | POA: Insufficient documentation

## 2013-11-01 DIAGNOSIS — I252 Old myocardial infarction: Secondary | ICD-10-CM | POA: Insufficient documentation

## 2013-11-01 DIAGNOSIS — Z6841 Body Mass Index (BMI) 40.0 and over, adult: Secondary | ICD-10-CM | POA: Insufficient documentation

## 2013-11-01 HISTORY — PX: THYROIDECTOMY: SHX17

## 2013-11-01 LAB — GLUCOSE, CAPILLARY
GLUCOSE-CAPILLARY: 177 mg/dL — AB (ref 70–99)
Glucose-Capillary: 165 mg/dL — ABNORMAL HIGH (ref 70–99)
Glucose-Capillary: 209 mg/dL — ABNORMAL HIGH (ref 70–99)
Glucose-Capillary: 245 mg/dL — ABNORMAL HIGH (ref 70–99)
Glucose-Capillary: 257 mg/dL — ABNORMAL HIGH (ref 70–99)
Glucose-Capillary: 266 mg/dL — ABNORMAL HIGH (ref 70–99)

## 2013-11-01 LAB — BASIC METABOLIC PANEL
BUN: 16 mg/dL (ref 6–23)
CHLORIDE: 97 meq/L (ref 96–112)
CO2: 26 meq/L (ref 19–32)
CREATININE: 0.89 mg/dL (ref 0.50–1.10)
Calcium: 9.4 mg/dL (ref 8.4–10.5)
GFR calc Af Amer: 79 mL/min — ABNORMAL LOW (ref >90)
GFR calc non Af Amer: 69 mL/min — ABNORMAL LOW (ref >90)
GLUCOSE: 298 mg/dL — AB (ref 70–99)
POTASSIUM: 4.3 meq/L (ref 3.7–5.3)
Sodium: 136 mEq/L — ABNORMAL LOW (ref 137–147)

## 2013-11-01 SURGERY — THYROIDECTOMY
Anesthesia: General | Site: Neck

## 2013-11-01 MED ORDER — NEOSTIGMINE METHYLSULFATE 10 MG/10ML IV SOLN
INTRAVENOUS | Status: DC | PRN
  Administered 2013-11-01: 4 mg via INTRAVENOUS

## 2013-11-01 MED ORDER — PREGABALIN 75 MG PO CAPS
75.0000 mg | ORAL_CAPSULE | Freq: Every day | ORAL | Status: DC
  Administered 2013-11-01 – 2013-11-02 (×2): 75 mg via ORAL
  Filled 2013-11-01 (×2): qty 1

## 2013-11-01 MED ORDER — GLYCOPYRROLATE 0.2 MG/ML IJ SOLN
INTRAMUSCULAR | Status: DC | PRN
  Administered 2013-11-01: .4 mg via INTRAVENOUS

## 2013-11-01 MED ORDER — GLYCOPYRROLATE 0.2 MG/ML IJ SOLN
INTRAMUSCULAR | Status: AC
  Filled 2013-11-01: qty 1

## 2013-11-01 MED ORDER — INSULIN GLARGINE 100 UNIT/ML ~~LOC~~ SOLN
25.0000 [IU] | Freq: Every day | SUBCUTANEOUS | Status: DC
  Administered 2013-11-01 – 2013-11-02 (×2): 25 [IU] via SUBCUTANEOUS
  Filled 2013-11-01 (×4): qty 0.25

## 2013-11-01 MED ORDER — FENTANYL CITRATE 0.05 MG/ML IJ SOLN
INTRAMUSCULAR | Status: AC
  Filled 2013-11-01: qty 2

## 2013-11-01 MED ORDER — METOPROLOL TARTRATE 100 MG PO TABS
100.0000 mg | ORAL_TABLET | Freq: Two times a day (BID) | ORAL | Status: DC
  Administered 2013-11-01 – 2013-11-03 (×4): 100 mg via ORAL
  Filled 2013-11-01 (×5): qty 1

## 2013-11-01 MED ORDER — HYDRALAZINE HCL 50 MG PO TABS
50.0000 mg | ORAL_TABLET | Freq: Two times a day (BID) | ORAL | Status: DC
  Administered 2013-11-01 – 2013-11-03 (×4): 50 mg via ORAL
  Filled 2013-11-01 (×6): qty 1

## 2013-11-01 MED ORDER — MIDAZOLAM HCL 5 MG/5ML IJ SOLN
INTRAMUSCULAR | Status: DC | PRN
  Administered 2013-11-01: 2 mg via INTRAVENOUS

## 2013-11-01 MED ORDER — CALCIUM CARBONATE-VITAMIN D 500-200 MG-UNIT PO TABS: 2.0000 | ORAL_TABLET | Freq: Three times a day (TID) | ORAL | Status: DC

## 2013-11-01 MED ORDER — MIDAZOLAM HCL 2 MG/2ML IJ SOLN
INTRAMUSCULAR | Status: AC
  Filled 2013-11-01: qty 2

## 2013-11-01 MED ORDER — PROMETHAZINE HCL 25 MG/ML IJ SOLN: 6.2500 mg | INTRAMUSCULAR | Status: DC | PRN

## 2013-11-01 MED ORDER — PROPOFOL 10 MG/ML IV BOLUS
INTRAVENOUS | Status: DC | PRN
  Administered 2013-11-01: 200 mg via INTRAVENOUS

## 2013-11-01 MED ORDER — ROCURONIUM BROMIDE 100 MG/10ML IV SOLN
INTRAVENOUS | Status: DC | PRN
  Administered 2013-11-01: 20 mg via INTRAVENOUS
  Administered 2013-11-01: 30 mg via INTRAVENOUS

## 2013-11-01 MED ORDER — FENTANYL CITRATE 0.05 MG/ML IJ SOLN
INTRAMUSCULAR | Status: DC | PRN
  Administered 2013-11-01 (×2): 50 ug via INTRAVENOUS
  Administered 2013-11-01: 100 ug via INTRAVENOUS
  Administered 2013-11-01: 50 ug via INTRAVENOUS

## 2013-11-01 MED ORDER — LOSARTAN POTASSIUM 50 MG PO TABS
100.0000 mg | ORAL_TABLET | Freq: Every morning | ORAL | Status: DC
  Administered 2013-11-02 – 2013-11-03 (×2): 100 mg via ORAL
  Filled 2013-11-01 (×2): qty 2

## 2013-11-01 MED ORDER — ACETAMINOPHEN 325 MG PO TABS
325.0000 mg | ORAL_TABLET | Freq: Four times a day (QID) | ORAL | Status: DC | PRN
  Administered 2013-11-01 – 2013-11-02 (×2): 650 mg via ORAL
  Filled 2013-11-01 (×2): qty 2

## 2013-11-01 MED ORDER — KCL IN DEXTROSE-NACL 20-5-0.45 MEQ/L-%-% IV SOLN
INTRAVENOUS | Status: DC
  Administered 2013-11-01: 16:00:00 via INTRAVENOUS
  Administered 2013-11-02: 50 mL/h via INTRAVENOUS
  Administered 2013-11-02: 02:00:00 via INTRAVENOUS
  Filled 2013-11-01 (×5): qty 1000

## 2013-11-01 MED ORDER — AMLODIPINE BESYLATE 10 MG PO TABS
10.0000 mg | ORAL_TABLET | Freq: Every morning | ORAL | Status: DC
  Administered 2013-11-02 – 2013-11-03 (×2): 10 mg via ORAL
  Filled 2013-11-01 (×2): qty 1

## 2013-11-01 MED ORDER — ONDANSETRON HCL 4 MG PO TABS
4.0000 mg | ORAL_TABLET | Freq: Four times a day (QID) | ORAL | Status: DC | PRN
  Administered 2013-11-02 (×2): 4 mg via ORAL
  Filled 2013-11-01 (×2): qty 1

## 2013-11-01 MED ORDER — PROPOFOL 10 MG/ML IV BOLUS
INTRAVENOUS | Status: AC
  Filled 2013-11-01: qty 20

## 2013-11-01 MED ORDER — LIDOCAINE HCL (CARDIAC) 20 MG/ML IV SOLN
INTRAVENOUS | Status: AC
  Filled 2013-11-01: qty 5

## 2013-11-01 MED ORDER — ONDANSETRON HCL 4 MG/2ML IJ SOLN
INTRAMUSCULAR | Status: DC | PRN
  Administered 2013-11-01: 4 mg via INTRAVENOUS

## 2013-11-01 MED ORDER — CALCIUM CARBONATE-VITAMIN D 500-200 MG-UNIT PO TABS
3.0000 | ORAL_TABLET | Freq: Three times a day (TID) | ORAL | Status: DC
  Administered 2013-11-01 – 2013-11-03 (×6): 3 via ORAL
  Filled 2013-11-01 (×10): qty 3

## 2013-11-01 MED ORDER — ROCURONIUM BROMIDE 100 MG/10ML IV SOLN
INTRAVENOUS | Status: AC
  Filled 2013-11-01: qty 1

## 2013-11-01 MED ORDER — FENTANYL CITRATE 0.05 MG/ML IJ SOLN
25.0000 ug | INTRAMUSCULAR | Status: DC | PRN
  Administered 2013-11-01: 25 ug via INTRAVENOUS
  Administered 2013-11-01: 50 ug via INTRAVENOUS

## 2013-11-01 MED ORDER — FENTANYL CITRATE 0.05 MG/ML IJ SOLN
INTRAMUSCULAR | Status: AC
  Filled 2013-11-01: qty 5

## 2013-11-01 MED ORDER — LACTATED RINGERS IV SOLN
INTRAVENOUS | Status: DC | PRN
Start: 2013-11-01 — End: 2013-11-01
  Administered 2013-11-01 (×2): via INTRAVENOUS

## 2013-11-01 MED ORDER — FLUTICASONE PROPIONATE 50 MCG/ACT NA SUSP
2.0000 | Freq: Every day | NASAL | Status: DC
  Administered 2013-11-01 – 2013-11-03 (×3): 2 via NASAL
  Filled 2013-11-01: qty 16

## 2013-11-01 MED ORDER — METFORMIN HCL ER 750 MG PO TB24
750.0000 mg | ORAL_TABLET | Freq: Two times a day (BID) | ORAL | Status: DC
  Administered 2013-11-01 – 2013-11-03 (×4): 750 mg via ORAL
  Filled 2013-11-01 (×6): qty 1

## 2013-11-01 MED ORDER — INSULIN ASPART 100 UNIT/ML ~~LOC~~ SOLN
0.0000 [IU] | Freq: Three times a day (TID) | SUBCUTANEOUS | Status: DC
  Administered 2013-11-01: 11 [IU] via SUBCUTANEOUS
  Administered 2013-11-02: 7 [IU] via SUBCUTANEOUS
  Administered 2013-11-02: 4 [IU] via SUBCUTANEOUS
  Administered 2013-11-02: 15 [IU] via SUBCUTANEOUS
  Administered 2013-11-03: 7 [IU] via SUBCUTANEOUS

## 2013-11-01 MED ORDER — MEPERIDINE HCL 50 MG/ML IJ SOLN: 6.2500 mg | INTRAMUSCULAR | Status: DC | PRN

## 2013-11-01 MED ORDER — 0.9 % SODIUM CHLORIDE (POUR BTL) OPTIME
TOPICAL | Status: DC | PRN
  Administered 2013-11-01: 1000 mL

## 2013-11-01 MED ORDER — LEVOTHYROXINE SODIUM 125 MCG PO TABS
125.0000 ug | ORAL_TABLET | Freq: Every day | ORAL | Status: DC
  Administered 2013-11-02 – 2013-11-03 (×2): 125 ug via ORAL
  Filled 2013-11-01 (×3): qty 1

## 2013-11-01 MED ORDER — INTERFERON BETA-1A 30 MCG/0.5ML IM KIT
30.0000 ug | PACK | INTRAMUSCULAR | Status: DC
  Administered 2013-11-01: 30 ug via INTRAMUSCULAR
  Filled 2013-11-01: qty 0.5

## 2013-11-01 MED ORDER — TRAZODONE HCL 100 MG PO TABS
200.0000 mg | ORAL_TABLET | Freq: Every day | ORAL | Status: DC
  Administered 2013-11-01 – 2013-11-02 (×2): 200 mg via ORAL
  Filled 2013-11-01 (×2): qty 2
  Filled 2013-11-01: qty 4
  Filled 2013-11-01: qty 2

## 2013-11-01 MED ORDER — LACTATED RINGERS IV SOLN: INTRAVENOUS | Status: DC

## 2013-11-01 MED ORDER — LIDOCAINE HCL (CARDIAC) 20 MG/ML IV SOLN
INTRAVENOUS | Status: DC | PRN
  Administered 2013-11-01: 100 mg via INTRAVENOUS

## 2013-11-01 MED ORDER — FLUTICASONE PROPIONATE HFA 220 MCG/ACT IN AERO
2.0000 | INHALATION_SPRAY | Freq: Two times a day (BID) | RESPIRATORY_TRACT | Status: DC
  Administered 2013-11-01 – 2013-11-03 (×4): 2 via RESPIRATORY_TRACT
  Filled 2013-11-01: qty 12

## 2013-11-01 MED ORDER — MORPHINE SULFATE 2 MG/ML IJ SOLN
2.0000 mg | INTRAMUSCULAR | Status: DC | PRN
  Administered 2013-11-01 – 2013-11-02 (×7): 2 mg via INTRAVENOUS
  Filled 2013-11-01 (×7): qty 1

## 2013-11-01 MED ORDER — OXYCODONE HCL 5 MG PO TABS: 5.0000 mg | ORAL_TABLET | ORAL | Status: DC | PRN

## 2013-11-01 MED ORDER — HYDRALAZINE HCL 20 MG/ML IJ SOLN
INTRAMUSCULAR | Status: AC
  Filled 2013-11-01: qty 1

## 2013-11-01 MED ORDER — FUROSEMIDE 40 MG PO TABS
40.0000 mg | ORAL_TABLET | Freq: Every morning | ORAL | Status: DC
  Administered 2013-11-02 – 2013-11-03 (×2): 40 mg via ORAL
  Filled 2013-11-01 (×2): qty 1

## 2013-11-01 MED ORDER — PANTOPRAZOLE SODIUM 40 MG PO TBEC
40.0000 mg | DELAYED_RELEASE_TABLET | Freq: Every day | ORAL | Status: DC
  Administered 2013-11-02 – 2013-11-03 (×2): 40 mg via ORAL
  Filled 2013-11-01 (×2): qty 1

## 2013-11-01 MED ORDER — LEVOTHYROXINE SODIUM 125 MCG PO TABS: 125.0000 ug | ORAL_TABLET | Freq: Every day | ORAL | Status: DC

## 2013-11-01 MED ORDER — ONDANSETRON HCL 4 MG/2ML IJ SOLN: 4.0000 mg | INTRAMUSCULAR | Status: DC | PRN

## 2013-11-01 MED ORDER — NEOSTIGMINE METHYLSULFATE 10 MG/10ML IV SOLN
INTRAVENOUS | Status: AC
  Filled 2013-11-01: qty 1

## 2013-11-01 MED ORDER — HYDRALAZINE HCL 20 MG/ML IJ SOLN
5.0000 mg | INTRAMUSCULAR | Status: DC
  Administered 2013-11-01: 5 mg via INTRAVENOUS

## 2013-11-01 SURGICAL SUPPLY — 48 items
APL SKNCLS STERI-STRIP NONHPOA (GAUZE/BANDAGES/DRESSINGS) ×1
ATTRACTOMAT 16X20 MAGNETIC DRP (DRAPES) ×3 IMPLANT
BENZOIN TINCTURE PRP APPL 2/3 (GAUZE/BANDAGES/DRESSINGS) ×3 IMPLANT
BLADE HEX COATED 2.75 (ELECTRODE) ×3 IMPLANT
BLADE SURG 15 STRL LF DISP TIS (BLADE) ×2 IMPLANT
BLADE SURG 15 STRL SS (BLADE) ×3
CANISTER SUCTION 2500CC (MISCELLANEOUS) ×1 IMPLANT
CHLORAPREP W/TINT 26ML (MISCELLANEOUS) ×2 IMPLANT
CLIP TI MEDIUM 6 (CLIP) ×12 IMPLANT
CLIP TI WIDE RED SMALL 6 (CLIP) ×10 IMPLANT
CLOSURE WOUND 1/2 X4 (GAUZE/BANDAGES/DRESSINGS) ×1
DISSECTOR ROUND CHERRY 3/8 STR (MISCELLANEOUS) ×3 IMPLANT
DRAIN PENROSE 18X1/4 LTX STRL (WOUND CARE) IMPLANT
DRAPE PED LAPAROTOMY (DRAPES) ×3 IMPLANT
DRESSING SURGICEL FIBRLLR 1X2 (HEMOSTASIS) IMPLANT
DRSG SURGICEL FIBRILLAR 1X2 (HEMOSTASIS) ×3
ELECT REM PT RETURN 9FT ADLT (ELECTROSURGICAL) ×3
ELECTRODE REM PT RTRN 9FT ADLT (ELECTROSURGICAL) ×1 IMPLANT
GAUZE SPONGE 4X4 16PLY XRAY LF (GAUZE/BANDAGES/DRESSINGS) ×6 IMPLANT
GLOVE BIOGEL PI IND STRL 6.5 (GLOVE) IMPLANT
GLOVE BIOGEL PI IND STRL 7.0 (GLOVE) ×1 IMPLANT
GLOVE BIOGEL PI INDICATOR 6.5 (GLOVE) ×2
GLOVE BIOGEL PI INDICATOR 7.0 (GLOVE) ×8
GLOVE ECLIPSE 8.0 STRL XLNG CF (GLOVE) ×5 IMPLANT
GLOVE INDICATOR 8.0 STRL GRN (GLOVE) ×4 IMPLANT
GLOVE SURG ORTHO 8.0 STRL STRW (GLOVE) ×2 IMPLANT
GOWN STRL REUS W/TWL LRG LVL3 (GOWN DISPOSABLE) ×1 IMPLANT
GOWN STRL REUS W/TWL XL LVL3 (GOWN DISPOSABLE) ×10 IMPLANT
HEMOSTAT SURGICEL 2X14 (HEMOSTASIS) ×1 IMPLANT
KIT BASIN OR (CUSTOM PROCEDURE TRAY) ×3 IMPLANT
NS IRRIG 1000ML POUR BTL (IV SOLUTION) ×3 IMPLANT
PACK BASIC VI WITH GOWN DISP (CUSTOM PROCEDURE TRAY) ×3 IMPLANT
PENCIL BUTTON HOLSTER BLD 10FT (ELECTRODE) ×3 IMPLANT
SHEARS HARMONIC 9CM CVD (BLADE) ×2 IMPLANT
SPONGE GAUZE 4X4 12PLY (GAUZE/BANDAGES/DRESSINGS) ×3 IMPLANT
STAPLER VISISTAT 35W (STAPLE) ×1 IMPLANT
STRIP CLOSURE SKIN 1/2X4 (GAUZE/BANDAGES/DRESSINGS) ×2 IMPLANT
SUT MNCRL AB 4-0 PS2 18 (SUTURE) ×5 IMPLANT
SUT SILK 2 0 (SUTURE) ×3
SUT SILK 2-0 18XBRD TIE 12 (SUTURE) ×1 IMPLANT
SUT SILK 3 0 (SUTURE)
SUT SILK 3-0 18XBRD TIE 12 (SUTURE) ×1 IMPLANT
SUT VIC AB 3-0 SH 18 (SUTURE) ×5 IMPLANT
SUT VICRYL 3 0 BR 18  UND (SUTURE)
SUT VICRYL 3 0 BR 18 UND (SUTURE) IMPLANT
SYR BULB IRRIGATION 50ML (SYRINGE) ×3 IMPLANT
TOWEL OR 17X26 10 PK STRL BLUE (TOWEL DISPOSABLE) ×3 IMPLANT
YANKAUER SUCT BULB TIP 10FT TU (MISCELLANEOUS) ×3 IMPLANT

## 2013-11-01 NOTE — Discharge Instructions (Signed)
[facility]      [facility] °[phone] ° °THYROID/ PARATHYROID SURGERY: POST OP INSTRUCTIONS ° °Always review your discharge instruction sheet given to you by the facility where your surgery was performed. ° °IF YOU HAVE DISABILITY OR FAMILY LEAVE FORMS, YOU MUST BRING THEM TO THE OFFICE FOR PROCESSING.  PLEASE DO NOT GIVE THEM TO YOUR DOCTOR. ° °1. A prescription for pain medication may be given to you upon discharge.  Take your pain medication as prescribed, if needed.  If narcotic pain medicine is not needed, then you may take acetaminophen (Tylenol) or ibuprofen (Advil) as needed. °2. Take your usually prescribed medications unless otherwise directed. °3. If you need a refill on your pain medication, please contact your pharmacy. They will contact our office to request authorization.  Prescriptions will not be filled after 5pm or on week-ends. °4. You should follow a light diet the first 24 hours after arrival home, such as soup and crackers, etc.  Be sure to include lots of fluids daily.  Resume your normal diet the day after surgery. °5. Most patients will experience some swelling and bruising on the chest and neck area.  Ice packs will help.  Swelling and bruising can take several days to resolve.  °6. It is common to experience some constipation if taking pain medication after surgery.  Increasing fluid intake and taking a stool softener will usually help or prevent this problem from occurring.  A mild laxative (Milk of Magnesia or Miralax) should be taken according to package directions if there are no bowel movements after 48 hours. °7. Unless discharge instructions indicate otherwise, you may remove your bandages 48 hours after surgery, and you may shower at that time.  You may have steri-strips (small skin tapes) in place directly over the incision.  These strips should be left on the skin.  If your surgeon used skin glue on the incision, you may shower in 24 hours.  The glue will flake off  over the next 2-3 weeks.  Any sutures or staples will be removed at the office during your follow-up visit. °8. ACTIVITIES:  You may resume regular (light) daily activities beginning the next day--such as daily self-care, walking, climbing stairs--gradually increasing activities as tolerated.  You may have sexual intercourse when it is comfortable.  Refrain from any heavy lifting or straining for one week. °a. You may drive when you no longer are taking prescription pain medication, you can comfortably wear a seatbelt, and you can safely maneuver your car and apply brakes °b. RETURN TO WORK:  __________________________________________________________ °9. You should see your doctor in the office for a follow-up appointment approximately two weeks after your surgery.  Make sure that you call for this appointment within a day or two after you arrive home to insure a convenient appointment time. °10. OTHER INSTRUCTIONS: ____________________________________________________________________________ _________________________________________________________________________________________________________________ °_________________________________________________________________________________________________________________ ° ° °WHEN TO CALL YOUR DOCTOR: °1. Fever over 101.0 °2. Inability to urinate °3. Nausea and/or vomiting °4. Extreme swelling or bruising °5. Continued bleeding from incision. °6. Increased pain, redness, or drainage from the incision. °7. Difficulty swallowing or breathing °8. Muscle cramping or spasms. °9. Numbness or tingling in hands or feet or around lips. ° °The clinic staff is available to answer your questions during regular business hours.  Please don’t hesitate to call and ask to speak to one of the nurses if you have concerns. ° °For further questions, please visit [email or web address] °

## 2013-11-01 NOTE — Transfer of Care (Signed)
Immediate Anesthesia Transfer of Care Note  Patient: [name] Fry  Procedure(s) Performed: Procedure(s): THYROIDECTOMY (N/A)  Patient Location: PACU  Anesthesia Type:General  Level of Consciousness: awake, alert  and oriented  Airway & Oxygen Therapy: Patient Spontanous Breathing and Patient connected to face mask oxygen  Post-op Assessment: Report given to PACU RN and Post -op Vital signs reviewed and stable  Post vital signs: Reviewed and stable  Complications: No apparent anesthesia complications

## 2013-11-01 NOTE — Op Note (Signed)
Operative Note  [name] Fry female 63 y.o. 11/01/2013  PREOPERATIVE DX:  Multinodular goiter  POSTOPERATIVE DX:  Same  PROCEDURE:  Total thyroidectomy         Surgeon: Adolph PollackOSENBOWER,Christien Berthelot J   Assistants: Darnell Levelodd Gerkin, M.D.  Anesthesia: General endotracheal anesthesia  Indications: This is a 63 year old female with a multinodular goiter that is enlarging despite suppression therapy. She does have some symptoms because of this. She now presents for the above procedure.    Procedure Detail:  She was seen in the holding area. She was brought to the operating room placed supine on the operating table and a general anesthetic was given. She was placed in slight neck extension. The neck and upper chest wall were sterilely prepped and draped.  A low transverse incision was made in the neck dividing the skin, subcutaneous tissues, and platysma. Subplatysmal flaps were raised down to the sternal notch inferiorly and up to the thyroid cartilage superiorly. The fascia between the strap muscles was divided. A significantly enlarged thyroid isthmus was noted.  Beginning on the left side, the strap muscles were dissected free from the enlarged left lobe. Using dissection directly on the thyroid gland, the superior pole was mobilized. The superior pole vessels were divided between ties and clips.  The inferior pole was then approached. Using dissection on the thyroid gland it was mobilized by dividing small vessels between clips and using the harmonic scalpel. The medial aspect of the gland was then identified. The gland was mobilized up into the incision. A superior parathyroid gland was identified and preserved. Small veins were divided close to the thyroid. At this point the fibers on the trachea. I visualized the left recurrent laryngeal nerve and it was intact. The left lower thyroid gland was dissected off the airway. The isthmus was then mobilized off the area. A small pyramidal lobe was then  mobilized off the airway as well using electrocautery. A sponge was placed to the left of the trachea.  Next the right lobe was approached. It was enlarged and multi-nodular as well. The strap muscles were dissected free from the right lobe of the thyroid gland. The superior pole was mobilized by dividing the superior pole vessels between clips and using harmonic scalpel. All dissection was done on the thyroid gland. The inferior pole was approached and using dissection the thyroid gland small vessels were divided between clips and the inferior pole was mobilized. This middle portion of the fibers and approach. Using dissection close to the thyroid gland this was mobilized off the trachea dividing small vessels between clips and using the harmonic scalpel. I did not see an actual parathyroid gland this side. The recurrent laryngeal nerve was visualized and was intact.  The right lobe of the thyroid gland was an dissected free from the trachea. The entire gland was removed. The left upper pole was marked with a suture. The gland was sent to pathology.  Both areas of the neck were inspected and irrigated. This bleeding points were controlled with small clips and electrocautery. Once hemostasis was adequate, Surgicel was placed on both sides of the neck. The strap muscles were approximated with interrupted 3-0 Vicryl sutures. The platysma muscles were approximated with interrupted 0 Vicryl sutures. The skin was closed with a 4-0 Monocryl subcuticular stitch. Steri-Strips and a sterile dressing were applied.  She tolerated the procedure well without any apparent complications and was taken to the recovery in satisfactory condition.   Estimated Blood Loss:  150 ml  Drains: none  Blood Given: none          Specimens: Thyroid gland        Complications:  * No complications entered in OR log *         Disposition: PACU - hemodynamically stable.         Condition: stable

## 2013-11-01 NOTE — H&P (Signed)
[name] Fry is an 63 y.o. female.   Chief Complaint: Here for elective thyroidectomy HPI: She has a long-standing multinodular goiter. She has been on suppressive therapy for this. Despite that, some of the nodules are getting larger. She also is symptomatic from this. She now presents for thyroidectomy.  Past Medical History  Diagnosis Date  . Diabetes mellitus   . Hypertension 05/07/2012    Normal 2D Echo; Renal Arterial Doppler 02/02/2007 - less than 60% diameter reduction, left proximal renal artery  . Asthma   . COPD (chronic obstructive pulmonary disease)   . Multiple sclerosis   . Goiter   . Hypertensive hypertrophic cardiomyopathy, by echo 05/07/12 05/08/2012  . Dyspnea 12/12/2005    CHF and Unspecified Chest pain - Myocardial Perfusion - post stress EF-72, negative for ischemia  . Hx of cardiac catheterization     normal cardiac cath  . Chronic diastolic HF (heart failure) 10/17/2012  . H/O cardiac catheterization 10/17/2012  . Hypothyroidism   . Depression   . Dyslipidemia   . Chronic low back pain   . Peripheral edema   . Neurogenic bladder   . Obesity   . Gait disorder   . GERD (gastroesophageal reflux disease)   . Arthritis   . Complication of anesthesia     HAS ASTHMA FLARES AFTER MOST SURGERIES  . Pneumonia DEC 2013    Past Surgical History  Procedure Laterality Date  . Cardiac catheterization Left 05/06/2012    Moderate caliber vessel from mid LAD  . Cardiac catheterization Right 04/02/2007    Left ventricular hypertrophy, no coronary disease, no renal artery stenosis  . Cesarean section       3x -[id number] AND 1985  . Cholecystectomy      1973  . Breast surgery      2 tumors removed -right breast  . Appendectomy      1973  . Arthoscopic rotaor cuff repair Right 1995  . Joint replacement       right shoulder 1985     Family History  Problem Relation Age of Onset  . Coronary artery disease Father 33    MI  . Diabetes Father   . Heart disease  Mother   . Diabetes Mother    Social History:  reports that she quit smoking about 10 years ago. Her smoking use included Cigarettes. She has a 20 pack-year smoking history. She has never used smokeless tobacco. She reports that she does not drink alcohol or use illicit drugs.  Allergies:  Allergies  Allergen Reactions  . Codeine Other (See Comments)    PASS OUT  . Other     Poppy seeds.- EYES SWELL AND HIVES  . Penicillins Other (See Comments)    "PASS OUT, MESSES WITH MY HEART RATE"  . Tape     Pink tape-RASH AND ITCHING    Medications Prior to Admission  Medication Sig Dispense Refill  . amLODipine (NORVASC) 10 MG tablet Take 10 mg by mouth every morning.      . fluticasone (FLONASE) 50 MCG/ACT nasal spray Place 2 sprays into the nose daily.  1 g  0  . fluticasone (FLOVENT HFA) 220 MCG/ACT inhaler Inhale 2 puffs into the lungs 2 (two) times daily.      . furosemide (LASIX) 40 MG tablet Take 40 mg by mouth every morning.      . hydrALAZINE (APRESOLINE) 50 MG tablet Take 1 tablet (50 mg total) by mouth 2 (two) times daily.  180 tablet  3  . insulin glargine (LANTUS) 100 UNIT/ML injection Inject 25 Units into the skin at bedtime.       . insulin lispro (HUMALOG) 100 UNIT/ML injection Inject 55 Units into the skin every morning.       Marland Kitchen. losartan (COZAAR) 100 MG tablet Take 100 mg by mouth every morning.      . metFORMIN (GLUCOPHAGE-XR) 750 MG 24 hr tablet Take 750 mg by mouth 2 (two) times daily.      . metoprolol (LOPRESSOR) 100 MG tablet Take 1 tablet (100 mg total) by mouth 2 (two) times daily.  180 tablet  3  . naproxen sodium (ANAPROX) 220 MG tablet Take 440 mg by mouth 2 (two) times daily as needed (pain).      . pantoprazole (PROTONIX) 40 MG tablet Take 40 mg by mouth at bedtime as needed and may repeat dose one time if needed.       . pregabalin (LYRICA) 75 MG capsule Take 75 mg by mouth at bedtime.      . traZODone (DESYREL) 100 MG tablet Take 200 mg by mouth at bedtime.       Marland Kitchen. aspirin EC 81 MG tablet Take 81 mg by mouth every morning.      Marland Kitchen. atorvastatin (LIPITOR) 20 MG tablet Take 1 tablet (20 mg total) by mouth at bedtime.  90 tablet  2  . interferon beta-1a (AVONEX) 30 MCG/0.5ML injection Inject 30 mcg into the muscle every Friday. TAKES QFRIDAY AT 800 PM        Results for orders placed during the hospital encounter of 11/01/13 (from the past 48 hour(s))  GLUCOSE, CAPILLARY     Status: Abnormal   Collection Time    11/01/13  9:06 AM      Result Value Ref Range   Glucose-Capillary 177 (*) 70 - 99 mg/dL  GLUCOSE, CAPILLARY     Status: Abnormal   Collection Time    11/01/13  9:42 AM      Result Value Ref Range   Glucose-Capillary 165 (*) 70 - 99 mg/dL   Comment 1 Documented in Chart     No results found.  Review of Systems  Constitutional: Negative for fever and chills.  HENT: Negative for congestion and sore throat.   Respiratory: Negative for cough.   Gastrointestinal: Negative for vomiting, abdominal pain and diarrhea.  Neurological:       Balance problems due to the multiple sclerosis. She uses a walker to ambulate.    Blood pressure 174/63, pulse 66, temperature 97.5 F (36.4 C), temperature source Oral, resp. rate 20, SpO2 98.00%. Physical Exam  Constitutional: No distress.  Morbidly obese  HENT:  Head: Normocephalic and atraumatic.  Eyes: No scleral icterus.  Neck: Thyromegaly present.  Cardiovascular: Normal rate and regular rhythm.   Respiratory: Effort normal and breath sounds normal.  GI: Soft. There is no tenderness.  Right upper quadrant scar  Musculoskeletal: She exhibits no edema.  Neurological: She is alert.  Skin: Skin is warm and dry.  Psychiatric: She has a normal mood and affect. Her behavior is normal.     Assessment/Plan Multinodular thyroid goiter that is symptomatic and enlarging despite suppressive therapy.  Plan: Total thyroidectomy. The procedure and risks have been discussed with her previously.  Jim Desanctisodd J  Shanikia Kernodle 11/01/2013, 10:36 AM

## 2013-11-01 NOTE — Progress Notes (Signed)
CBG 209. Dr Acey Lavarignan aware and no orders received.

## 2013-11-01 NOTE — Anesthesia Postprocedure Evaluation (Signed)
  Anesthesia Post-op Note  Patient: [name] CanaryBarbara A Wilmore  Procedure(s) Performed: Procedure(s) (LRB): THYROIDECTOMY (N/A)  Patient Location: PACU  Anesthesia Type: General  Level of Consciousness: awake and alert   Airway and Oxygen Therapy: Patient Spontanous Breathing  Post-op Pain: mild  Post-op Assessment: Post-op Vital signs reviewed, Patient's Cardiovascular Status Stable, Respiratory Function Stable, Patent Airway and No signs of Nausea or vomiting  Last Vitals:  Filed Vitals:   11/01/13 1415  BP: 171/76  Pulse: 70  Temp: 36.5 C  Resp: 14    Post-op Vital Signs: stable   Complications: No apparent anesthesia complications

## 2013-11-01 NOTE — Interval H&P Note (Signed)
History and Physical Interval Note:  11/01/2013 10:39 AM  [name] Fry  has presented today for surgery, with the diagnosis of multinodular goiter   The various methods of treatment have been discussed with the patient and family. After consideration of risks, benefits and other options for treatment, the patient has consented to  Procedure(s): THYROIDECTOMY (N/A) as a surgical intervention .  The patient's history has been reviewed, patient examined, no change in status, stable for surgery.  I have reviewed the patient's chart and labs.  Questions were answered to the patient's satisfaction.     Jim Desanctis Lauriann Milillo

## 2013-11-01 NOTE — Anesthesia Preprocedure Evaluation (Signed)
Anesthesia Evaluation  Patient identified by MRN, date of birth, ID band Patient awake    Reviewed: Allergy & Precautions, H&P , NPO status , Patient's Chart, lab work & pertinent test results  History of Anesthesia Complications History of anesthetic complications: h/o asthma flares post-op.  Airway Mallampati: II TM Distance: >3 FB Neck ROM: Full    Dental no notable dental hx. (+) Edentulous Upper, Loose, Missing, Chipped, Poor Dentition   Pulmonary asthma , COPDformer smoker,  breath sounds clear to auscultation  Pulmonary exam normal       Cardiovascular hypertension, Pt. on medications +CHF - Past MI Rhythm:Regular Rate:Normal     Neuro/Psych negative neurological ROS  negative psych ROS   GI/Hepatic negative GI ROS, Neg liver ROS,   Endo/Other  diabetes, Type 2, Oral Hypoglycemic Agents, Insulin DependentMorbid obesity  Renal/GU negative Renal ROS  negative genitourinary   Musculoskeletal negative musculoskeletal ROS (+)   Abdominal   Peds negative pediatric ROS (+)  Hematology negative hematology ROS (+)   Anesthesia Other Findings   Reproductive/Obstetrics negative OB ROS                           Anesthesia Physical Anesthesia Plan  ASA: III  Anesthesia Plan: General   Post-op Pain Management:    Induction: Intravenous  Airway Management Planned: Oral ETT  Additional Equipment:   Intra-op Plan:   Post-operative Plan: Extubation in OR  Informed Consent: I have reviewed the patients History and Physical, chart, labs and discussed the procedure including the risks, benefits and alternatives for the proposed anesthesia with the patient or authorized representative who has indicated his/her understanding and acceptance.   Dental advisory given  Plan Discussed with: CRNA  Anesthesia Plan Comments: (Tight fluid restriction to mitigate risk of post-op CHF)         Anesthesia Quick Evaluation

## 2013-11-02 LAB — BASIC METABOLIC PANEL
BUN: 15 mg/dL (ref 6–23)
BUN: 21 mg/dL (ref 6–23)
CHLORIDE: 97 meq/L (ref 96–112)
CO2: 27 meq/L (ref 19–32)
CO2: 27 meq/L (ref 19–32)
CREATININE: 0.86 mg/dL (ref 0.50–1.10)
CREATININE: 1.23 mg/dL — AB (ref 0.50–1.10)
Calcium: 9.4 mg/dL (ref 8.4–10.5)
Calcium: 9.3 mg/dL (ref 8.4–10.5)
Chloride: 96 mEq/L (ref 96–112)
GFR calc non Af Amer: 71 mL/min — ABNORMAL LOW (ref >90)
GFR calc non Af Amer: 46 mL/min — ABNORMAL LOW (ref >90)
GFR, EST AFRICAN AMERICAN: 83 mL/min — AB (ref >90)
GFR, EST AFRICAN AMERICAN: 54 mL/min — AB (ref >90)
GLUCOSE: 251 mg/dL — AB (ref 70–99)
Glucose, Bld: 179 mg/dL — ABNORMAL HIGH (ref 70–99)
POTASSIUM: 4.2 meq/L (ref 3.7–5.3)
POTASSIUM: 4.1 meq/L (ref 3.7–5.3)
Sodium: 135 mEq/L — ABNORMAL LOW (ref 137–147)
Sodium: 136 mEq/L — ABNORMAL LOW (ref 137–147)

## 2013-11-02 LAB — GLUCOSE, CAPILLARY
GLUCOSE-CAPILLARY: 244 mg/dL — AB (ref 70–99)
GLUCOSE-CAPILLARY: 271 mg/dL — AB (ref 70–99)
GLUCOSE-CAPILLARY: 328 mg/dL — AB (ref 70–99)
GLUCOSE-CAPILLARY: 198 mg/dL — AB (ref 70–99)
Glucose-Capillary: 151 mg/dL — ABNORMAL HIGH (ref 70–99)
Glucose-Capillary: 159 mg/dL — ABNORMAL HIGH (ref 70–99)

## 2013-11-02 MED ORDER — OXYCODONE-ACETAMINOPHEN 5-325 MG PO TABS
1.0000 | ORAL_TABLET | ORAL | Status: DC | PRN
  Administered 2013-11-02: 1 via ORAL
  Administered 2013-11-02 (×2): 2 via ORAL
  Administered 2013-11-03: 1 via ORAL
  Administered 2013-11-03: 2 via ORAL
  Filled 2013-11-02: qty 2
  Filled 2013-11-02 (×2): qty 1
  Filled 2013-11-02 (×2): qty 2

## 2013-11-02 NOTE — Progress Notes (Signed)
Patient ID: [name] Fry, female   DOB: 08-19-1950, 63 y.o.   MRN: [id number]  General Surgery - [facility] - Progress Note  POD# 1  Subjective: Patient up in chair at bedside.  Husband in room.  Multiple complaints of pain, hoarseness, inability to swallow.  Not sure she can go home today.  Objective: Vital signs in last 24 hours: Temp:  [97.5 F (36.4 C)-98.9 F (37.2 C)] 98.9 F (37.2 C) (05/30 0555) Pulse Rate:  [49-80] 59 (05/30 0555) Resp:  [11-20] 16 (05/30 0555) BP: (92-197)/(63-154) 137/81 mmHg (05/30 0555) SpO2:  [97 %-100 %] 99 % (05/30 0555) Weight:  [250 lb (113.399 kg)] 250 lb (113.399 kg) (05/29 1452) Last BM Date: 11/01/13  Intake/Output from previous day: 05/29 0701 - 05/30 0700 In: 2821.3 [P.O.:500; I.V.:2321.3] Out: 1650 [Urine:1650]  Exam: HEENT - clear, not icteric Neck - wound clear and dry and intact with steristrips; minimal soft tissue swelling; voice essentially normal at conversational level Chest - clear bilaterally Cor - RRR, no murmur Ext - no significant edema Neuro - grossly intact, no focal deficits  Lab Results:  No results found for this basename: WBC, HGB, HCT, PLT,  in the last 72 hours   Recent Labs  11/01/13 1809  NA 136*  K 4.3  CL 97  CO2 26  GLUCOSE 298*  BUN 16  CREATININE 0.89  CALCIUM 9.4    Studies/Results: No results found.  Assessment / Plan: 1.  Status post total thyroidectomy  Calcium level 9.4 post op last night - will repeat now  Advance to soft diet  Change pain Rx to Percocet  Home later today or tomorrow  Velora Heckler, MD, [facility] Office: [phone]  11/02/2013

## 2013-11-03 LAB — BASIC METABOLIC PANEL
BUN: 23 mg/dL (ref 6–23)
CO2: 28 mEq/L (ref 19–32)
Calcium: 9.4 mg/dL (ref 8.4–10.5)
Chloride: 96 mEq/L (ref 96–112)
Creatinine, Ser: 1.18 mg/dL — ABNORMAL HIGH (ref 0.50–1.10)
GFR calc non Af Amer: 49 mL/min — ABNORMAL LOW (ref >90)
GFR, EST AFRICAN AMERICAN: 56 mL/min — AB (ref >90)
Glucose, Bld: 258 mg/dL — ABNORMAL HIGH (ref 70–99)
Potassium: 4.7 mEq/L (ref 3.7–5.3)
SODIUM: 133 meq/L — AB (ref 137–147)

## 2013-11-03 LAB — GLUCOSE, CAPILLARY: Glucose-Capillary: 242 mg/dL — ABNORMAL HIGH (ref 70–99)

## 2013-11-03 MED ORDER — CALCIUM CARBONATE-VITAMIN D 500-200 MG-UNIT PO TABS: 2.0000 | ORAL_TABLET | Freq: Two times a day (BID) | ORAL | Status: DC

## 2013-11-03 MED ORDER — OXYCODONE-ACETAMINOPHEN 5-325 MG PO TABS: 1.0000 | ORAL_TABLET | ORAL | Status: DC | PRN

## 2013-11-03 NOTE — Progress Notes (Signed)
Discharge instructions discussed with pt and husband. Voice understanding of when to call md, diet and activity. Am assessment unchanged.  Discharged with husband via wheelchair

## 2013-11-03 NOTE — Discharge Summary (Signed)
Physician Discharge Summary [facility]  Patient ID: [name] SaneBarbara A Cyphers MRN: [id number] DOB/AGE: 62/06/1950 62 y.o.  Admit date: 11/01/2013 Discharge date: 11/03/2013  Admission Diagnoses:  Multinodular goiter  Discharge Diagnoses:  Active Problems:   Nontoxic multinodular goiter   Discharged Condition: good  Hospital Course: Patient was admitted for observation following thyroid surgery.  Post op course was uncomplicated.  Pain was well controlled.  Tolerated diet.  Post op calcium level on morning following surgery was 9.4 mg/dl.  Patient was prepared for discharge home on POD#2.  Consults: None  Treatments: surgery: total thyroidectomy  Discharge Exam: Blood pressure 105/73, pulse 70, temperature 98.3 F (36.8 C), temperature source Oral, resp. rate 16, height 5\' 1"  (1.549 m), weight 250 lb (113.399 kg), SpO2 100.00%. HEENT - clear Neck - wound clear and dry and intact; minimal STS; voice with mild hoarseness Chest - clear bilaterally Cor - RRR   Disposition: Home  Discharge Instructions   Apply dressing    Complete by:  As directed   Apply light dressing to wound before discharge home today.     Diet - low sodium heart healthy    Complete by:  As directed      Discharge instructions    Complete by:  As directed   THYROID & PARATHYROID SURGERY - POST OP INSTRUCTIONS  Always review your discharge instruction sheet from the facility where your surgery was performed.  A prescription for pain medication may be given to you upon discharge.  Take your pain medication as prescribed.  If narcotic pain medicine is not needed, then you may take acetaminophen (Tylenol) or ibuprofen (Advil) as needed.  Take your usually prescribed medications unless otherwise directed.  If you need a refill on your pain medication, please contact your pharmacy. They will contact our office to request authorization.  Prescriptions will not be processed after 5 pm or on  weekends.  Start with a light diet upon arrival home, such as soup and crackers or toast.  Be sure to drink plenty of fluids daily.  Resume your normal diet the day after surgery.  Most patients will experience some swelling and bruising on the chest and neck area.  Ice packs will help.  Swelling and bruising can take several days to resolve.   It is common to experience some constipation if taking pain medication after surgery.  Increasing fluid intake and taking a stool softener will usually help or prevent this problem.  A mild laxative (Milk of Magnesia or Miralax) should be taken according to package directions if there are no bowel movements after 48 hours.  You may remove your bandages 24-48 hours after surgery, and you may shower at that time.  You have steri-strips (small skin tapes) in place directly over the incision.  These strips should be left on the skin for 7-10 days and then removed.  You may resume regular (light) daily activities beginning the next day-such as daily self-care, walking, climbing stairs-gradually increasing activities as tolerated.  You may have sexual intercourse when it is comfortable.  Refrain from any heavy lifting or straining until approved by your doctor.  You may drive when you no longer are taking prescription pain medication, you can comfortably wear a seatbelt, and you can safely maneuver your car and apply brakes.  You should see your doctor in the office for a follow-up appointment approximately two to three weeks after your surgery.  Make sure that you call for this appointment within a  day or two after you arrive home to insure a convenient appointment time.  WHEN TO CALL YOUR DOCTOR: -- Fever greater than 101.5 -- Inability to urinate -- Nausea and/or vomiting - persistent -- Extreme swelling or bruising -- Continued bleeding from incision -- Increased pain, redness, or drainage from the incision -- Difficulty swallowing or breathing -- Muscle  cramping or spasms -- Numbness or tingling in hands or around lips  The clinic staff is available to answer your questions during regular business hours.  Please don't hesitate to call and ask to speak to one of the nurses if you have concerns.  Velora Heckler, MD, FACS General & Endocrine Surgery [facility] Office: [phone]     Increase activity slowly    Complete by:  As directed      No dressing needed    Complete by:  As directed             Medication List         amLODipine 10 MG tablet  Commonly known as:  NORVASC  Take 10 mg by mouth every morning.     aspirin EC 81 MG tablet  Take 81 mg by mouth every morning.     atorvastatin 20 MG tablet  Commonly known as:  LIPITOR  Take 1 tablet (20 mg total) by mouth at bedtime.     calcium-vitamin D 500-200 MG-UNIT per tablet  Commonly known as:  OSCAL WITH D  Take 2 tablets by mouth 3 (three) times daily.     calcium-vitamin D 500-200 MG-UNIT per tablet  Commonly known as:  OSCAL WITH D  Take 2 tablets by mouth 2 (two) times daily.     fluticasone 220 MCG/ACT inhaler  Commonly known as:  FLOVENT HFA  Inhale 2 puffs into the lungs 2 (two) times daily.     fluticasone 50 MCG/ACT nasal spray  Commonly known as:  FLONASE  Place 2 sprays into the nose daily.     furosemide 40 MG tablet  Commonly known as:  LASIX  Take 40 mg by mouth every morning.     hydrALAZINE 50 MG tablet  Commonly known as:  APRESOLINE  Take 1 tablet (50 mg total) by mouth 2 (two) times daily.     insulin glargine 100 UNIT/ML injection  Commonly known as:  LANTUS  Inject 25 Units into the skin at bedtime.     insulin lispro 100 UNIT/ML injection  Commonly known as:  HUMALOG  Inject 55 Units into the skin every morning.     interferon beta-1a 30 MCG/0.5ML injection  Commonly known as:  AVONEX  Inject 30 mcg into the muscle every Friday. TAKES QFRIDAY AT 800 PM     levothyroxine 125 MCG tablet  Commonly known  as:  SYNTHROID  Take 1 tablet (125 mcg total) by mouth daily before breakfast.     losartan 100 MG tablet  Commonly known as:  COZAAR  Take 100 mg by mouth every morning.     metFORMIN 750 MG 24 hr tablet  Commonly known as:  GLUCOPHAGE-XR  Take 750 mg by mouth 2 (two) times daily.     metoprolol 100 MG tablet  Commonly known as:  LOPRESSOR  Take 1 tablet (100 mg total) by mouth 2 (two) times daily.     naproxen sodium 220 MG tablet  Commonly known as:  ANAPROX  Take 440 mg by mouth 2 (two) times daily as needed (pain).  oxyCODONE 5 MG immediate release tablet  Commonly known as:  Oxy IR/ROXICODONE  Take 1-2 tablets (5-10 mg total) by mouth every 4 (four) hours as needed.     oxyCODONE-acetaminophen 5-325 MG per tablet  Commonly known as:  PERCOCET/ROXICET  Take 1-2 tablets by mouth every 4 (four) hours as needed for moderate pain.     pantoprazole 40 MG tablet  Commonly known as:  PROTONIX  Take 40 mg by mouth at bedtime as needed and may repeat dose one time if needed.     pregabalin 75 MG capsule  Commonly known as:  LYRICA  Take 75 mg by mouth at bedtime.     traZODone 100 MG tablet  Commonly known as:  DESYREL  Take 200 mg by mouth at bedtime.         Velora Heckler, MD, [facility] Office: [phone]   Signed: Huey Bienenstock Yigit Norkus 11/03/2013, 7:46 AM

## 2013-11-04 ENCOUNTER — Telehealth (INDEPENDENT_AMBULATORY_CARE_PROVIDER_SITE_OTHER): Payer: Self-pay

## 2013-11-04 ENCOUNTER — Other Ambulatory Visit (INDEPENDENT_AMBULATORY_CARE_PROVIDER_SITE_OTHER): Payer: Self-pay

## 2013-11-04 ENCOUNTER — Other Ambulatory Visit (INDEPENDENT_AMBULATORY_CARE_PROVIDER_SITE_OTHER): Payer: Self-pay | Admitting: General Surgery

## 2013-11-04 ENCOUNTER — Encounter (HOSPITAL_COMMUNITY): Payer: Self-pay | Admitting: General Surgery

## 2013-11-04 DIAGNOSIS — E890 Postprocedural hypothyroidism: Secondary | ICD-10-CM

## 2013-11-04 DIAGNOSIS — Z9889 Other specified postprocedural states: Secondary | ICD-10-CM

## 2013-11-04 NOTE — Telephone Encounter (Signed)
Pt has a post op thyroidectomy appt on 11/25/13 with Dr. Abbey Chatters.  She will need a calcium level drawn prior to this appt.  Pls give the patient instructions to go to [facility] at least two days prior to her appt.  Order is in Epic.

## 2013-11-07 NOTE — Telephone Encounter (Signed)
LMOV pt's biopsy showed a thyroid goiter, no cancer.

## 2013-11-07 NOTE — Telephone Encounter (Signed)
Pt made aware to get calcium drawn no later than 6/20 for her 6/22 post op appointment.

## 2013-11-23 LAB — CALCIUM: Calcium: 9.6 mg/dL (ref 8.4–10.5)

## 2013-11-25 ENCOUNTER — Other Ambulatory Visit (INDEPENDENT_AMBULATORY_CARE_PROVIDER_SITE_OTHER): Payer: Self-pay

## 2013-11-25 ENCOUNTER — Ambulatory Visit (INDEPENDENT_AMBULATORY_CARE_PROVIDER_SITE_OTHER): Payer: 59 | Admitting: General Surgery

## 2013-11-25 ENCOUNTER — Other Ambulatory Visit (INDEPENDENT_AMBULATORY_CARE_PROVIDER_SITE_OTHER): Payer: Self-pay | Admitting: General Surgery

## 2013-11-25 ENCOUNTER — Encounter (INDEPENDENT_AMBULATORY_CARE_PROVIDER_SITE_OTHER): Payer: Self-pay | Admitting: General Surgery

## 2013-11-25 VITALS — BP 130/72 | HR 68 | Temp 98.0°F | Resp 18 | Ht 61.0 in | Wt 249.0 lb

## 2013-11-25 DIAGNOSIS — E890 Postprocedural hypothyroidism: Secondary | ICD-10-CM

## 2013-11-25 DIAGNOSIS — Z4889 Encounter for other specified surgical aftercare: Secondary | ICD-10-CM

## 2013-11-25 DIAGNOSIS — Z9889 Other specified postprocedural states: Secondary | ICD-10-CM

## 2013-11-25 MED ORDER — OXYCODONE HCL 5 MG PO TABS: 5.0000 mg | ORAL_TABLET | ORAL | Status: DC | PRN

## 2013-11-25 NOTE — Patient Instructions (Signed)
Get blood drawn next week to check thyroid hormone level. Resume normal activities as long as there is no pain.

## 2013-11-25 NOTE — Progress Notes (Signed)
Procedure:  Total thyroidectomy for multinodular goiter  Date:  11/01/2013  Pathology:  Nodular hyperplasia, no malignancy  History:  She is here for her first postoperative visit. Calcium level is 9.4.  She is still having some incisional discomfort.  Exam: General- Is in NAD. Neck-Incision is clean and intact, no significant swelling present.  Assessment:  Progressing well post thyroidectomy. Calcium level is normal. Seems to have adequate energy on 125 mcg of Synthroid.  Plan:  Stopped supplemental calcium. Refill oxycodone.  Check TSH in one week. Return visit in 4 weeks.

## 2013-12-09 ENCOUNTER — Other Ambulatory Visit: Payer: Self-pay | Admitting: Cardiovascular Disease

## 2013-12-09 ENCOUNTER — Ambulatory Visit (INDEPENDENT_AMBULATORY_CARE_PROVIDER_SITE_OTHER): Payer: 59 | Admitting: Adult Health

## 2013-12-09 ENCOUNTER — Encounter: Payer: Self-pay | Admitting: Adult Health

## 2013-12-09 VITALS — BP 161/86 | HR 76 | Ht 61.0 in | Wt 251.0 lb

## 2013-12-09 DIAGNOSIS — Z5181 Encounter for therapeutic drug level monitoring: Secondary | ICD-10-CM

## 2013-12-09 DIAGNOSIS — G35 Multiple sclerosis: Secondary | ICD-10-CM

## 2013-12-09 DIAGNOSIS — R269 Unspecified abnormalities of gait and mobility: Secondary | ICD-10-CM

## 2013-12-09 NOTE — Telephone Encounter (Signed)
Rx was sent to pharmacy electronically. 

## 2013-12-09 NOTE — Progress Notes (Signed)
PATIENT: [name] SaneBarbara A Fry DOB: 05/21/1951  REASON FOR VISIT: follow up HISTORY FROM: patient  HISTORY OF PRESENT ILLNESS: Ms. [name] Fry is a 63 year old female with a history of multiple sclerosis and abnormality of gait. She returns today for followup. The patient currently takes Avonex and is tolerating it well. She denies any new numbness or weakness. Patient states that she has some hand tremors on the right side. She states that she only notices it when she is trying to use her hand. The patient uses a walker to ambulate and denies falls. Patient denies an increase in fatigue. Patient denies any trouble with swallowing. Patient states that she had tyroid removed 12/02/13. Patient denies any new issues with bowels or bladder. She states she used to take Detrol but has been out of the prescription for the last 3 months. No new medical issues since last seen.  REVIEW OF SYSTEMS: Full 14 system review of systems performed and notable only for:  Constitutional: N/A  Eyes: N/A Ear/Nose/Throat: N/A  Skin: N/A  Cardiovascular: N/A  Respiratory: Cough  Gastrointestinal: N/A  Genitourinary: Urgency Hematology/Lymphatic: Anemia Endocrine: N/A Musculoskeletal: Walking difficulty  Allergy/Immunology: N/A  Neurological: Numbness, tremors Psychiatric: N/A Sleep: Insomnia   ALLERGIES: Allergies  Allergen Reactions  . Codeine Other (See Comments)    PASS OUT  . Other     Poppy seeds.- EYES SWELL AND HIVES  . Penicillins Other (See Comments)    "PASS OUT, MESSES WITH MY HEART RATE"  . Tape     Pink tape-RASH AND ITCHING    HOME MEDICATIONS: Outpatient Prescriptions Prior to Visit  Medication Sig Dispense Refill  . amLODipine (NORVASC) 10 MG tablet Take 10 mg by mouth every morning.      Marland Kitchen. aspirin EC 81 MG tablet Take 81 mg by mouth every morning.      Marland Kitchen. atorvastatin (LIPITOR) 20 MG tablet Take 1 tablet (20 mg total) by mouth at bedtime.  90 tablet  2  . fluticasone (FLONASE) 50  MCG/ACT nasal spray Place 2 sprays into the nose daily.  1 g  0  . fluticasone (FLOVENT HFA) 220 MCG/ACT inhaler Inhale 2 puffs into the lungs 2 (two) times daily.      . furosemide (LASIX) 40 MG tablet Take 40 mg by mouth every morning.      . hydrALAZINE (APRESOLINE) 50 MG tablet Take 1 tablet (50 mg total) by mouth 2 (two) times daily.  180 tablet  3  . insulin glargine (LANTUS) 100 UNIT/ML injection Inject 25 Units into the skin at bedtime.       . insulin lispro (HUMALOG) 100 UNIT/ML injection Inject 60 Units into the skin every morning. In the am, and 30 units in the pm.      . interferon beta-1a (AVONEX) 30 MCG/0.5ML injection Inject 30 mcg into the muscle every Friday. TAKES QFRIDAY AT 800 PM      . levothyroxine (SYNTHROID) 125 MCG tablet Take 1 tablet (125 mcg total) by mouth daily before breakfast.  60 tablet  3  . losartan (COZAAR) 100 MG tablet Take 100 mg by mouth every morning.      . metFORMIN (GLUCOPHAGE-XR) 750 MG 24 hr tablet Take 750 mg by mouth 2 (two) times daily.      . metoprolol (LOPRESSOR) 100 MG tablet Take 1 tablet (100 mg total) by mouth 2 (two) times daily.  180 tablet  3  . naproxen sodium (ANAPROX) 220 MG tablet Take 440 mg by mouth 2 (  two) times daily as needed (pain).      Marland Kitchen oxyCODONE (OXY IR/ROXICODONE) 5 MG immediate release tablet Take 1-2 tablets (5-10 mg total) by mouth every 4 (four) hours as needed.  30 tablet  0  . oxyCODONE-acetaminophen (PERCOCET/ROXICET) 5-325 MG per tablet Take 1-2 tablets by mouth every 4 (four) hours as needed for moderate pain.  20 tablet  0  . pantoprazole (PROTONIX) 40 MG tablet Take 40 mg by mouth at bedtime as needed and may repeat dose one time if needed.       . pregabalin (LYRICA) 75 MG capsule Take 75 mg by mouth at bedtime.      . traZODone (DESYREL) 100 MG tablet Take 200 mg by mouth at bedtime.      . calcium-vitamin D (OSCAL WITH D) 500-200 MG-UNIT per tablet Take 2 tablets by mouth 3 (three) times daily.  60 tablet  2  .  calcium-vitamin D (OSCAL WITH D) 500-200 MG-UNIT per tablet Take 2 tablets by mouth 2 (two) times daily.  60 tablet  0   No facility-administered medications prior to visit.    PAST MEDICAL HISTORY: Past Medical History  Diagnosis Date  . Diabetes mellitus   . Hypertension 05/07/2012    Normal 2D Echo; Renal Arterial Doppler 02/02/2007 - less than 60% diameter reduction, left proximal renal artery  . Asthma   . COPD (chronic obstructive pulmonary disease)   . Multiple sclerosis   . Goiter   . Hypertensive hypertrophic cardiomyopathy, by echo 05/07/12 05/08/2012  . Dyspnea 12/12/2005    CHF and Unspecified Chest pain - Myocardial Perfusion - post stress EF-72, negative for ischemia  . Hx of cardiac catheterization     normal cardiac cath  . Chronic diastolic HF (heart failure) 10/17/2012  . H/O cardiac catheterization 10/17/2012  . Hypothyroidism   . Depression   . Dyslipidemia   . Chronic low back pain   . Peripheral edema   . Neurogenic bladder   . Obesity   . Gait disorder   . GERD (gastroesophageal reflux disease)   . Arthritis   . Complication of anesthesia     HAS ASTHMA FLARES AFTER MOST SURGERIES  . Pneumonia DEC 2013    PAST SURGICAL HISTORY: Past Surgical History  Procedure Laterality Date  . Cardiac catheterization Left 05/06/2012    Moderate caliber vessel from mid LAD  . Cardiac catheterization Right 04/02/2007    Left ventricular hypertrophy, no coronary disease, no renal artery stenosis  . Cesarean section       3x -[id number] AND 1985  . Cholecystectomy      1973  . Breast surgery      2 tumors removed -right breast  . Appendectomy      1973  . Arthoscopic rotaor cuff repair Right 1995  . Joint replacement       right shoulder 1985   . Thyroidectomy N/A 11/01/2013    Procedure: THYROIDECTOMY;  Surgeon: Adolph Pollack, MD;  Location: [facility];  Service: General;  Laterality: N/A;    FAMILY HISTORY: Family History  Problem Relation Age of Onset  .  Coronary artery disease Father 80    MI  . Diabetes Father   . Heart disease Mother   . Diabetes Mother     SOCIAL HISTORY: History   Social History  . Marital Status: Married    Spouse Name: Johnny    Number of Children: 3  . Years of Education: 14   Occupational History  .  Not on file.   Social History Main Topics  . Smoking status: Former Smoker -- 1.00 packs/day for 20 years    Types: Cigarettes    Quit date: 10/24/2003  . Smokeless tobacco: Never Used  . Alcohol Use: No  . Drug Use: No  . Sexual Activity: Not on file   Other Topics Concern  . Not on file   Social History Narrative   Patient is married Company secretary) and lives with her husband and her sister-in-law.   Patient has three children.   Patient is disabled.   Patient has a college education.   Patient is right-handed.   Patient does not drink any caffeine.      PHYSICAL EXAM  Filed Vitals:   12/09/13 1325  BP: 161/86  Pulse: 76  Height: 5\' 1"  (1.549 m)  Weight: 251 lb (113.853 kg)   Body mass index is 47.45 kg/(m^2).  Generalized: Well developed, in no acute distress   Neurological examination  Mentation: Alert oriented to time, place, history taking. Follows all commands speech and language fluent Cranial nerve II-XII: . Extraocular movements were full, visual field were full on confrontational test. Motor: The motor testing reveals 5 over 5 strength of all 4 extremities. Good symmetric motor tone is noted throughout.  Sensory: Sensory testing is intact to soft touch on all 4 extremities. No evidence of extinction is noted.  Coordination: Cerebellar testing reveals good finger-nose-finger and heel-to-shin bilaterally. No intention or resting tremor noted.   Gait and station: Gait is wide-based, uses a walker to ambulate. Tandem gait not attempted. Romberg is negative. No drift is seen.  Reflexes: Deep tendon reflexes are symmetric and normal bilaterally.    DIAGNOSTIC DATA (LABS, IMAGING,  TESTING) - I reviewed patient records, labs, notes, testing and imaging myself where available.  Lab Results  Component Value Date   WBC 9.9 10/23/2013   HGB 14.1 10/23/2013   HCT 42.8 10/23/2013   MCV 91.1 10/23/2013   PLT 105* 10/23/2013      Component Value Date/Time   NA 133* 11/03/2013 0825   NA 139 06/10/2013 1517   K 4.7 11/03/2013 0825   CL 96 11/03/2013 0825   CO2 28 11/03/2013 0825   GLUCOSE 258* 11/03/2013 0825   GLUCOSE 476* 06/10/2013 1517   BUN 23 11/03/2013 0825   BUN 12 06/10/2013 1517   CREATININE 1.18* 11/03/2013 0825   CREATININE 0.94 10/17/2012 1502   CALCIUM 9.6 11/22/2013 1409   PROT 8.1 10/23/2013 1412   PROT 7.4 06/10/2013 1517   ALBUMIN 3.4* 10/23/2013 1412   AST 19 10/23/2013 1412   ALT 26 10/23/2013 1412   ALKPHOS 131* 10/23/2013 1412   BILITOT 0.5 10/23/2013 1412   GFRNONAA 49* 11/03/2013 0825   GFRAA 56* 11/03/2013 0825   Lab Results  Component Value Date   TRIG 135 05/15/2012   No results found for this basename: HGBA1C   Lab Results  Component Value Date   VITAMINB12 850 12/05/2012   Lab Results  Component Value Date   TSH 0.886 12/05/2012      ASSESSMENT AND PLAN 63 y.o. year old female  has a past medical history of Diabetes mellitus; Hypertension (05/07/2012); Asthma; COPD (chronic obstructive pulmonary disease); Multiple sclerosis; Goiter; Hypertensive hypertrophic cardiomyopathy, by echo 05/07/12 (05/08/2012); Dyspnea (12/12/2005); cardiac catheterization; Chronic diastolic HF (heart failure) (10/17/2012); H/O cardiac catheterization (10/17/2012); Hypothyroidism; Depression; Dyslipidemia; Chronic low back pain; Peripheral edema; Neurogenic bladder; Obesity; Gait disorder; GERD (gastroesophageal reflux disease); Arthritis; Complication of anesthesia; and  Pneumonia (DEC 2013). here with:  1. multiple sclerosis 2. Gait abnormality 3. Encounter for therapeutic drug monitoring  Patient has remained stable. Denies any new symptoms. Patient is currently taking Avonex  and is tolerating it well. Patient continues to ambulate with a walker and denies any recent falls. I will check blood work today. At the next visit we will recheck an MRI of the brain and spine. Patient should followup in 6 months or sooner if needed.  Butch Penny, MSN, NP-C 12/09/2013, 1:37 PM [facility] [address] [place], [place] [address] [phone]  Note: This document was prepared with digital dictation and possible smart phrase technology. Any transcriptional errors that result from this process are unintentional.

## 2013-12-09 NOTE — Patient Instructions (Signed)

## 2013-12-09 NOTE — Progress Notes (Signed)
I have read the note, and I agree with the clinical assessment and plan.  WILLIS,CHARLES KEITH   

## 2013-12-10 ENCOUNTER — Telehealth: Payer: Self-pay | Admitting: Adult Health

## 2013-12-10 LAB — COMPREHENSIVE METABOLIC PANEL
A/G RATIO: 1.3 (ref 1.1–2.5)
ALBUMIN: 3.9 g/dL (ref 3.6–4.8)
ALK PHOS: 105 IU/L (ref 39–117)
ALT: 17 IU/L (ref 0–32)
AST: 14 IU/L (ref 0–40)
BUN/Creatinine Ratio: 13 (ref 11–26)
BUN: 10 mg/dL (ref 8–27)
CO2: 29 mmol/L (ref 18–29)
CREATININE: 0.76 mg/dL (ref 0.57–1.00)
Calcium: 9.7 mg/dL (ref 8.7–10.3)
Chloride: 100 mmol/L (ref 96–108)
GFR calc Af Amer: 98 mL/min/{1.73_m2} (ref >59)
GFR, EST NON AFRICAN AMERICAN: 85 mL/min/{1.73_m2} (ref >59)
Globulin, Total: 3.1 g/dL (ref 1.5–4.5)
Glucose: 257 mg/dL — ABNORMAL HIGH (ref 65–99)
Potassium: 4.3 mmol/L (ref 3.5–5.2)
SODIUM: 137 mmol/L (ref 134–144)
Total Bilirubin: 0.8 mg/dL (ref 0.0–1.2)
Total Protein: 7 g/dL (ref 6.0–8.5)

## 2013-12-10 LAB — CBC WITH DIFFERENTIAL
BASOS: 0 %
Basophils Absolute: 0 10*3/uL (ref 0.0–0.2)
EOS: 3 %
Eosinophils Absolute: 0.4 10*3/uL (ref 0.0–0.4)
HCT: 39.6 % (ref 34.0–46.6)
HEMOGLOBIN: 13.4 g/dL (ref 11.1–15.9)
Lymphocytes Absolute: 2.9 10*3/uL (ref 0.7–3.1)
Lymphs: 22 %
MCH: 30 pg (ref 26.6–33.0)
MCHC: 33.8 g/dL (ref 31.5–35.7)
MCV: 89 fL (ref 79–97)
MONOS ABS: 0.7 10*3/uL (ref 0.1–0.9)
Monocytes: 5 %
NEUTROS PCT: 70 %
Neutrophils Absolute: 8.8 10*3/uL — ABNORMAL HIGH (ref 1.4–7.0)
PLATELETS: 127 10*3/uL — AB (ref 150–379)
RBC: 4.46 x10E6/uL (ref 3.77–5.28)
RDW: 15.1 % (ref 12.3–15.4)
WBC: 12.7 10*3/uL — ABNORMAL HIGH (ref 3.4–10.8)

## 2013-12-10 NOTE — Telephone Encounter (Signed)
I called the patient and went over lab work with her. Her WBC is elevated. Patient denies symptoms of infection. Although she does state that she has recently been having boils on her rectum. She states that they will come up and then resolve on their own. The patient has an appointment with her primary care physician this month I advised her to let him know about this issue. Patient verbalized understanding. Patient also had her thyroid removed at the end of May. Patient's WBC was increased one year ago as well but was normal one month ago.

## 2014-01-17 ENCOUNTER — Other Ambulatory Visit: Payer: Self-pay | Admitting: Cardiovascular Disease

## 2014-01-17 NOTE — Telephone Encounter (Signed)
Rx was sent to pharmacy electronically. 

## 2014-02-26 ENCOUNTER — Telehealth: Payer: Self-pay | Admitting: Cardiovascular Disease

## 2014-02-26 NOTE — Telephone Encounter (Signed)
Pt called in stating that her PCP advised her to call the office because she has had a cough for the past 2 wks. And she would like to know if she should increase her fluid pill.Please call  Thanks

## 2014-02-26 NOTE — Telephone Encounter (Signed)
Returned call to patient. She saw PCP today and he requested she contact [name] - he was concerned she may be building up fluid around her heart.   Patient reports a cough x 2 weeks - coughing up thick, clear, sticky phlegm that is worse when she lays down at night.  She denies SOB, denies edema, denies weight gain.  Patient has no other complaints.   Informed patient that [name] and his nurse would be notified of her complaints/symptoms and she will be contact with advice accordingly. Patient voiced understanding.

## 2014-02-27 ENCOUNTER — Telehealth: Payer: Self-pay | Admitting: Cardiovascular Disease

## 2014-02-27 NOTE — Telephone Encounter (Signed)
Bille, can you please call patient and make her an appt to see an APP tomorrow?

## 2014-02-27 NOTE — Telephone Encounter (Signed)
Closed encounter °

## 2014-02-28 ENCOUNTER — Ambulatory Visit (INDEPENDENT_AMBULATORY_CARE_PROVIDER_SITE_OTHER): Payer: 59 | Admitting: Physician Assistant

## 2014-02-28 ENCOUNTER — Ambulatory Visit: Payer: 59 | Admitting: Physician Assistant

## 2014-02-28 ENCOUNTER — Encounter: Payer: Self-pay | Admitting: Physician Assistant

## 2014-02-28 ENCOUNTER — Ambulatory Visit
Admission: RE | Admit: 2014-02-28 | Discharge: 2014-02-28 | Disposition: A | Discharge status: Home / Self Care | Payer: 59 | Source: Ambulatory Visit | Attending: Cardiovascular Disease | Admitting: Cardiovascular Disease

## 2014-02-28 VITALS — BP 180/110 | HR 68 | Ht 61.0 in | Wt 250.4 lb

## 2014-02-28 DIAGNOSIS — R059 Cough, unspecified: Secondary | ICD-10-CM

## 2014-02-28 DIAGNOSIS — I110 Hypertensive heart disease with heart failure: Secondary | ICD-10-CM

## 2014-02-28 DIAGNOSIS — I509 Heart failure, unspecified: Secondary | ICD-10-CM

## 2014-02-28 DIAGNOSIS — I422 Other hypertrophic cardiomyopathy: Secondary | ICD-10-CM

## 2014-02-28 DIAGNOSIS — R053 Chronic cough: Secondary | ICD-10-CM

## 2014-02-28 DIAGNOSIS — E118 Type 2 diabetes mellitus with unspecified complications: Secondary | ICD-10-CM

## 2014-02-28 DIAGNOSIS — Z0389 Encounter for observation for other suspected diseases and conditions ruled out: Secondary | ICD-10-CM

## 2014-02-28 DIAGNOSIS — IMO0002 Reserved for concepts with insufficient information to code with codable children: Secondary | ICD-10-CM

## 2014-02-28 DIAGNOSIS — E1165 Type 2 diabetes mellitus with hyperglycemia: Secondary | ICD-10-CM

## 2014-02-28 DIAGNOSIS — I10 Essential (primary) hypertension: Secondary | ICD-10-CM

## 2014-02-28 DIAGNOSIS — R05 Cough: Secondary | ICD-10-CM

## 2014-02-28 DIAGNOSIS — IMO0001 Reserved for inherently not codable concepts without codable children: Secondary | ICD-10-CM

## 2014-02-28 DIAGNOSIS — I5032 Chronic diastolic (congestive) heart failure: Secondary | ICD-10-CM

## 2014-02-28 MED ORDER — HYDRALAZINE HCL 50 MG PO TABS: 75.0000 mg | ORAL_TABLET | Freq: Two times a day (BID) | ORAL | Status: DC

## 2014-02-28 NOTE — Progress Notes (Signed)
Date:  02/28/2014   ID:  [name] Fry, DOB 06-03-1951, MRN [id number]  PCP:  [name], MD  Primary Cardiologist:  Allyson Sabal     History of Present Illness: [name] Fry is a 63 y.o. obese, married, African American female, mother of 3, grandmother to 6 grandchildren, formerly a patient of Dr. Fredirick Maudlin. She has a history of normal coronary arteries by catheterization back in 2008 as well as most recently May 07, 2012, by Dr. Herbie Baltimore. She has severe hypertension, diabetes, and hyperlipidemia. She was admitted to [facility], December 1 through 10, 2013 with hypertensive urgency, was intubated, and also treated for pneumonia. Medicines were adjusted. She is aware of salt restriction. She has history of remote tobacco abuse, having stopped in 2005.   Underwent total thyroidectomy on 11/01/2013.  The hospital course was uncomplicated.   Presents today for six-month evaluation reports developing a cough approximately 2 weeks ago and there was concern that it was related to heart failure.  However, other than the cough she denies nausea, vomiting, fever, chest pain, shortness of breath, orthopnea, dizziness, PND, abdominal pain, hematochezia, melena, lower extremity edema, claudication. Her weight is stable since June.   Wt Readings from Last 3 Encounters:  02/28/14 250 lb 6.4 oz (113.581 kg)  12/09/13 251 lb (113.853 kg)  11/25/13 249 lb (112.946 kg)     Past Medical History  Diagnosis Date  . Diabetes mellitus   . Hypertension 05/07/2012    Normal 2D Echo; Renal Arterial Doppler 02/02/2007 - less than 60% diameter reduction, left proximal renal artery  . Asthma   . COPD (chronic obstructive pulmonary disease)   . Multiple sclerosis   . Goiter   . Hypertensive hypertrophic cardiomyopathy, by echo 05/07/12 05/08/2012  . Dyspnea 12/12/2005    CHF and Unspecified Chest pain - Myocardial Perfusion - post stress EF-72, negative for ischemia  . Hx of cardiac catheterization     normal  cardiac cath  . Chronic diastolic HF (heart failure) 10/17/2012  . H/O cardiac catheterization 10/17/2012  . Hypothyroidism   . Depression   . Dyslipidemia   . Chronic low back pain   . Peripheral edema   . Neurogenic bladder   . Obesity   . Gait disorder   . GERD (gastroesophageal reflux disease)   . Arthritis   . Complication of anesthesia     HAS ASTHMA FLARES AFTER MOST SURGERIES  . Pneumonia DEC 2013    Current Outpatient Prescriptions  Medication Sig Dispense Refill  . amLODipine (NORVASC) 10 MG tablet Take 10 mg by mouth every morning.      Marland Kitchen aspirin EC 81 MG tablet Take 81 mg by mouth every morning.      Marland Kitchen atorvastatin (LIPITOR) 20 MG tablet Take 1 tablet (20 mg total) by mouth at bedtime.  90 tablet  2  . fluticasone (FLONASE) 50 MCG/ACT nasal spray Place 2 sprays into the nose daily.  1 g  0  . fluticasone (FLOVENT HFA) 220 MCG/ACT inhaler Inhale 2 puffs into the lungs 2 (two) times daily.      . hydrALAZINE (APRESOLINE) 50 MG tablet Take 1.5 tablets (75 mg total) by mouth 2 (two) times daily.  90 tablet  3  . insulin lispro protamine-lispro (HUMALOG 50/50 MIX) (50-50) 100 UNIT/ML SUSP injection Inject 30 Units into the skin every evening.      . insulin lispro protamine-lispro (HUMALOG 75/25 MIX) (75-25) 100 UNIT/ML SUSP injection Inject 60 Units into the skin every  morning.      . interferon beta-1a (AVONEX) 30 MCG/0.5ML injection Inject 30 mcg into the muscle every Friday. TAKES QFRIDAY AT 800 PM      . levothyroxine (SYNTHROID) 125 MCG tablet Take 1 tablet (125 mcg total) by mouth daily before breakfast.  60 tablet  3  . metFORMIN (GLUCOPHAGE-XR) 750 MG 24 hr tablet Take 750 mg by mouth 2 (two) times daily.      . metoprolol (LOPRESSOR) 100 MG tablet Take 1 tablet (100 mg total) by mouth 2 (two) times daily.  180 tablet  3  . naproxen sodium (ANAPROX) 220 MG tablet Take 440 mg by mouth 2 (two) times daily as needed (pain).      Marland Kitchen olmesartan-hydrochlorothiazide (BENICAR  HCT) 40-25 MG per tablet Take 1 tablet by mouth daily.      Marland Kitchen oxyCODONE (OXY IR/ROXICODONE) 5 MG immediate release tablet Take 1-2 tablets (5-10 mg total) by mouth every 4 (four) hours as needed.  30 tablet  0  . oxyCODONE-acetaminophen (PERCOCET/ROXICET) 5-325 MG per tablet Take 1-2 tablets by mouth every 4 (four) hours as needed for moderate pain.  20 tablet  0  . pantoprazole (PROTONIX) 40 MG tablet Take 40 mg by mouth at bedtime as needed and may repeat dose one time if needed.       . pantoprazole (PROTONIX) 40 MG tablet Take 1 tablet (40 mg total) by mouth daily.  90 tablet  1  . pregabalin (LYRICA) 75 MG capsule Take 75 mg by mouth at bedtime.      . traZODone (DESYREL) 100 MG tablet Take 200 mg by mouth at bedtime.       No current facility-administered medications for this visit.    Allergies:    Allergies  Allergen Reactions  . Codeine Other (See Comments)    PASS OUT  . Other     Poppy seeds.- EYES SWELL AND HIVES  . Penicillins Other (See Comments)    "PASS OUT, MESSES WITH MY HEART RATE"  . Tape     Pink tape-RASH AND ITCHING    Social History:  The patient  reports that she quit smoking about 10 years ago. Her smoking use included Cigarettes. She has a 20 pack-year smoking history. She has never used smokeless tobacco. She reports that she does not drink alcohol or use illicit drugs.   Family history:   Family History  Problem Relation Age of Onset  . Coronary artery disease Father 35    MI  . Diabetes Father   . Heart disease Mother   . Diabetes Mother     ROS:  Please see the history of present illness.  All other systems reviewed and negative.   PHYSICAL EXAM: VS:  BP 180/110  Pulse 68  Ht  (1.549 m)  Wt 250 lb 6.4 oz (113.581 kg)  BMI 47.34 kg/m2 Morbidly obese, well developed, in no acute distress HEENT: Pupils are equal round react to light accommodation extraocular movements are intact.  Neck: no JVDNo cervical lymphadenopathy. Cardiac: Regular  rate and rhythm without murmurs rubs or gallops. Lungs:  clear to auscultation bilaterally, no wheezing, rhonchi or rales Abd: soft, nontender, positive bowel sounds all quadrants Ext: no lower extremity edema.  2+ radial and dorsalis pedis pulses. Skin: warm and dry Neuro:  Grossly normal  EKG:  Sinus rhythm possible atrial enlargement. Septal Q waves. QTC of 482 ms/. No changes  ASSESSMENT AND PLAN:  Problem List Items Addressed This Visit  Morbid obesity (Chronic)     I recommend referral to registered dietitian.  Contact information provided    Relevant Medications      insulin lispro protamine-lispro (HUMALOG 50/50 MIX) (50-50) 100 UNIT/ML SUSP injection      insulin lispro protamine-lispro (HUMALOG 75/25 MIX) (75-25) 100 UNIT/ML SUSP injection   Normal coronary arteries by cath 2008 & 12/ 2013; Normal EF;  LVH on Echo   (Chronic)   Chronic diastolic HF (heart failure) (Chronic)     Patient appears euvolemic. She does sleep on 3 pillows but does not complain of PND or lower extremity edema. Her weight is stable and lungs sound clear.    Relevant Medications      olmesartan-hydrochlorothiazide (BENICAR HCT) 40-25 MG per tablet      hydrALAZINE (APRESOLINE) tablet   Diabetes mellitus type 2, uncontrolled, with complications   Relevant Medications      insulin lispro protamine-lispro (HUMALOG 50/50 MIX) (50-50) 100 UNIT/ML SUSP injection      insulin lispro protamine-lispro (HUMALOG 75/25 MIX) (75-25) 100 UNIT/ML SUSP injection      olmesartan-hydrochlorothiazide (BENICAR HCT) 40-25 MG per tablet   Hypertensive hypertrophic cardiomyopathy, by echo 05/07/12   Relevant Medications      olmesartan-hydrochlorothiazide (BENICAR HCT) 40-25 MG per tablet      hydrALAZINE (APRESOLINE) tablet   HTN (hypertension)     Pressures control. We'll increase hydralazine 75 mg twice daily. She'll follow up in 2 weeks for blood pressure check. She is very on amlodipine, Lopressor 100, Benicar HCTZ  40/25.  Encourage continued weight loss and dietary referral.    Relevant Medications      olmesartan-hydrochlorothiazide (BENICAR HCT) 40-25 MG per tablet      hydrALAZINE (APRESOLINE) tablet   Cough     She's had a cough now for 2 weeks. She apparently started the Benicar alone before that. He does not appear to be in acute heart failure. We'll check a chest x-ray.  Chest x-ray came back with no acute cardiopulmonary process. Borderline cardiomegaly.  The cough persists we can consider stopping ARB however, I do not think this is the culprit.  To be related to allergies.     Other Visit Diagnoses   Chronic diastolic heart failure    -  Primary    Relevant Medications       olmesartan-hydrochlorothiazide (BENICAR HCT) 40-25 MG per tablet       hydrALAZINE (APRESOLINE) tablet    Other Relevant Orders       EKG 12-Lead    Cough, persistent        Relevant Orders       DG Chest 2 View (Completed)

## 2014-02-28 NOTE — Assessment & Plan Note (Signed)
Patient appears euvolemic. She does sleep on 3 pillows but does not complain of PND or lower extremity edema. Her weight is stable and lungs sound clear.

## 2014-02-28 NOTE — Assessment & Plan Note (Signed)
I recommend referral to registered dietitian.  Contact information provided

## 2014-02-28 NOTE — Assessment & Plan Note (Signed)
Pressures control. We'll increase hydralazine 75 mg twice daily. She'll follow up in 2 weeks for blood pressure check. She is very on amlodipine, Lopressor 100, Benicar HCTZ 40/25.  Encourage continued weight loss and dietary referral.

## 2014-02-28 NOTE — Patient Instructions (Addendum)
1.  Increase hydralazine to 75 mg tiwce daily. 2.  Followup in two weeks for BP check - with an extender.  Wilburt Finlay, PA-C has recommended you have a chest x-ray. You will have this done at [facility]. Go to the [facility] to have this done. ([address] [employer] [address])

## 2014-02-28 NOTE — Assessment & Plan Note (Addendum)
She's had a cough now for 2 weeks. She apparently started the Benicar alone before that. He does not appear to be in acute heart failure. We'll check a chest x-ray.  Chest x-ray came back with no acute cardiopulmonary process. Borderline cardiomegaly.  The cough persists we can consider stopping ARB however, I do not think this is the culprit.  To be related to allergies.

## 2014-03-02 ENCOUNTER — Other Ambulatory Visit: Payer: Self-pay | Admitting: Cardiovascular Disease

## 2014-03-05 NOTE — Telephone Encounter (Signed)
Rx was sent to pharmacy electronically. Called patient to clarify losartan - informed patient that this was NOT on medication list from 9/25 when patient saw [place], [place] - patient states she is taking this medication.   Will forward to Samara Deist, RN and [name] as Lorain Childes of med discrepancy not noted at last OV

## 2014-03-11 NOTE — Telephone Encounter (Signed)
I spoke with patient.  Dr Chestine Spore stopped the losartan and started 40/25 Benicar HCT

## 2014-03-18 ENCOUNTER — Ambulatory Visit: Payer: 59 | Admitting: Cardiology

## 2014-03-21 ENCOUNTER — Ambulatory Visit (INDEPENDENT_AMBULATORY_CARE_PROVIDER_SITE_OTHER): Payer: 59 | Admitting: Pharmacist Clinician (PhC)/ Clinical Pharmacy Specialist

## 2014-03-21 VITALS — BP 162/100 | HR 80 | Ht 61.0 in | Wt 249.3 lb

## 2014-03-21 DIAGNOSIS — I10 Essential (primary) hypertension: Secondary | ICD-10-CM

## 2014-03-21 MED ORDER — HYDRALAZINE HCL 100 MG PO TABS: 100.0000 mg | ORAL_TABLET | Freq: Two times a day (BID) | ORAL | Status: DC

## 2014-03-21 NOTE — Patient Instructions (Signed)
Return for a a follow up appointment in 1 month  Your blood pressure today is 162/100   (goal is <150/90)  Check your blood pressure at home daily (if able) and keep record of the readings.  Take your BP meds as follows:  AM - Benicar HCT 40/25, hydralazine 100mg , metoprolol 100mg   PM - amlodipine 10mg , hydralazine 100mg , metoprolol 100mg   Try Claritin (loratidine) or Zyrtec (cetirizine) for 2-3 weeks to help with cough  Home BP cuff - wrist model - try Omron if affordable  Bring your BP cuff and your record of home blood pressures to your next appointment.  Exercise as you're able, try to walk approximately 30 minutes per day.  Keep salt intake to a minimum, especially watch canned and prepared boxed foods.  Eat more fresh fruits and vegetables and fewer canned items.  Avoid eating in fast food restaurants.    HOW TO TAKE YOUR BLOOD PRESSURE:   Rest 5 minutes before taking your blood pressure.    Don't smoke or drink caffeinated beverages for at least 30 minutes before.   Take your blood pressure before (not after) you eat.   Sit comfortably with your back supported and both feet on the floor (don't cross your legs).   Elevate your arm to heart level on a table or a desk.   Use the proper sized cuff. It should fit smoothly and snugly around your bare upper arm. There should be enough room to slip a fingertip under the cuff. The bottom edge of the cuff should be 1 inch above the crease of the elbow.   Ideally, take 3 measurements at one sitting and record the average.

## 2014-03-23 ENCOUNTER — Encounter: Payer: Self-pay | Admitting: Pharmacist Clinician (PhC)/ Clinical Pharmacy Specialist

## 2014-03-23 NOTE — Progress Notes (Signed)
03/23/2014 [name] SaneBarbara A Fry 05/23/1951 [id number]   HPI:  [name] SaneBarbara A Fry is a 63 y.o. female patient of Dr Allyson SabalBerry, with a PMH below who presents today for hypertension clinic evaluation.  She has a history of hypertension, hyperlipidemia and diabetes.  Repeated cardiac catheterizations in 2008 and in 2013 showed normal coronary arteries.  [facility] and pneumonia.   Her biggest complaint today is a continuing cough.  A chest x-ray after her last visit showed no acute cardiopulmonary process.  It was thought that it could be allergy related, and she continues to use fluticasone nasal spray.  She quit smoking in 2005 and does not drink any alcohol or caffeine.  She has been aware of the need for salt restriction for some time and believes she is doing well with this, using Ms Sharilyn SitesDash and avoiding high sodium foods.  She does not get any regular exercise, but does try to walk in her hallway regularly.  Her diabetes is not well controlled, although no A1c is available, her glucose tends to run in the 200s quite regularly.    She is unaware of any family history of cardiac disease, as she was adopted.  She has 3 children, all in their 30s, and none have cardiac concerns at this point.     Current Outpatient Prescriptions  Medication Sig Dispense Refill  . amLODipine (NORVASC) 10 MG tablet Take 1 tablet by mouth  daily  90 tablet  3  . aspirin EC 81 MG tablet Take 81 mg by mouth every morning.      Marland Kitchen. atorvastatin (LIPITOR) 20 MG tablet Take 1 tablet (20 mg total) by mouth at bedtime.  90 tablet  2  . fluticasone (FLONASE) 50 MCG/ACT nasal spray Place 2 sprays into the nose daily.  1 g  0  . fluticasone (FLOVENT HFA) 220 MCG/ACT inhaler Inhale 2 puffs into the lungs 2 (two) times daily.      . hydrALAZINE (APRESOLINE) 100 MG tablet Take 1 tablet (100 mg total) by mouth 2 (two) times daily.  180 tablet  1  . insulin lispro protamine-lispro  (HUMALOG 50/50 MIX) (50-50) 100 UNIT/ML SUSP injection Inject 30 Units into the skin every evening.      . insulin lispro protamine-lispro (HUMALOG 75/25 MIX) (75-25) 100 UNIT/ML SUSP injection Inject 60 Units into the skin every morning.      . interferon beta-1a (AVONEX) 30 MCG/0.5ML injection Inject 30 mcg into the muscle every Friday. TAKES QFRIDAY AT 800 PM      . levothyroxine (SYNTHROID) 125 MCG tablet Take 1 tablet (125 mcg total) by mouth daily before breakfast.  60 tablet  3  . losartan (COZAAR) 100 MG tablet Take 1 tablet by mouth  daily  90 tablet  3  . metFORMIN (GLUCOPHAGE-XR) 750 MG 24 hr tablet Take 750 mg by mouth 2 (two) times daily.      . metoprolol (LOPRESSOR) 100 MG tablet Take 1 tablet by mouth two  times daily  180 tablet  3  . naproxen sodium (ANAPROX) 220 MG tablet Take 440 mg by mouth 2 (two) times daily as needed (pain).      Marland Kitchen. olmesartan-hydrochlorothiazide (BENICAR HCT) 40-25 MG per tablet Take 1 tablet by mouth daily.      Marland Kitchen. oxyCODONE (OXY IR/ROXICODONE) 5 MG immediate release tablet Take 1-2 tablets (5-10 mg total) by mouth every 4 (four) hours as needed.  30 tablet  0  . oxyCODONE-acetaminophen (PERCOCET/ROXICET) 5-325 MG per tablet Take 1-2 tablets by mouth every 4 (four) hours as needed for moderate pain.  20 tablet  0  . pantoprazole (PROTONIX) 40 MG tablet Take 40 mg by mouth at bedtime as needed and may repeat dose one time if needed.       . pantoprazole (PROTONIX) 40 MG tablet Take 1 tablet (40 mg total) by mouth daily.  90 tablet  1  . pregabalin (LYRICA) 75 MG capsule Take 75 mg by mouth at bedtime.      . traZODone (DESYREL) 100 MG tablet Take 200 mg by mouth at bedtime.       No current facility-administered medications for this visit.    Allergies  Allergen Reactions  . Codeine Other (See Comments)    PASS OUT  . Other     Poppy seeds.- EYES SWELL AND HIVES  . Penicillins Other (See Comments)    "PASS OUT, MESSES WITH MY HEART RATE"  . Tape      Pink tape-RASH AND ITCHING    Past Medical History  Diagnosis Date  . Diabetes mellitus   . Hypertension 05/07/2012    Normal 2D Echo; Renal Arterial Doppler 02/02/2007 - less than 60% diameter reduction, left proximal renal artery  . Asthma   . COPD (chronic obstructive pulmonary disease)   . Multiple sclerosis   . Goiter   . Hypertensive hypertrophic cardiomyopathy, by echo 05/07/12 05/08/2012  . Dyspnea 12/12/2005    CHF and Unspecified Chest pain - Myocardial Perfusion - post stress EF-72, negative for ischemia  . Hx of cardiac catheterization     normal cardiac cath  . Chronic diastolic HF (heart failure) 10/17/2012  . H/O cardiac catheterization 10/17/2012  . Hypothyroidism   . Depression   . Dyslipidemia   . Chronic low back pain   . Peripheral edema   . Neurogenic bladder   . Obesity   . Gait disorder   . GERD (gastroesophageal reflux disease)   . Arthritis   . Complication of anesthesia     HAS ASTHMA FLARES AFTER MOST SURGERIES  . Pneumonia DEC 2013    Blood pressure 162/100, pulse 80, height 5\' 1"  (1.549 m), weight 249 lb 4.8 oz (113.082 kg).   ASSESSMENT AND PLAN:  Today her BP remains uncontrolled.  She has been taking the amlodipine 10mg  and benicar hct 40/25 each morning, as well as hydralazine 75mg  and metoprolol100mg  twice daily.  Today I have asked her to switch her amlodipine to evenings rather than mornings, and increase the hydralazine to 100mg  twice daily.  I also recommended that she take Claritin or Zyrtec daily for the next several weeks to see if that will stop her cough.   She will return in a month for follow up.  If the cough continues, we may be forced to stop the Benicar, to see if that is the problem.  If so we may have to try chlorthalidone and/or clonidine to achieve BP goals.    Phillips Hay PharmD CPP [facility]

## 2014-03-30 ENCOUNTER — Other Ambulatory Visit: Payer: Self-pay | Admitting: Cardiovascular Disease

## 2014-04-07 NOTE — Telephone Encounter (Signed)
Called patient to clarify furosemide. Per patient "Dr Chestine Spore stopped the furosemide and started her on Benicar HCT."\ Refill refused.

## 2014-04-12 ENCOUNTER — Other Ambulatory Visit: Payer: Self-pay | Admitting: Cardiovascular Disease

## 2014-04-14 NOTE — Telephone Encounter (Signed)
E sent to pharmacy 

## 2014-04-21 ENCOUNTER — Ambulatory Visit (INDEPENDENT_AMBULATORY_CARE_PROVIDER_SITE_OTHER): Payer: 59 | Admitting: Pharmacist Clinician (PhC)/ Clinical Pharmacy Specialist

## 2014-04-21 ENCOUNTER — Encounter: Payer: Self-pay | Admitting: Pharmacist Clinician (PhC)/ Clinical Pharmacy Specialist

## 2014-04-21 VITALS — BP 196/110 | HR 72 | Wt 252.6 lb

## 2014-04-21 DIAGNOSIS — I10 Essential (primary) hypertension: Secondary | ICD-10-CM

## 2014-04-21 MED ORDER — AZILSARTAN-CHLORTHALIDONE 40-12.5 MG PO TABS: 1.0000 | ORAL_TABLET | Freq: Every day | ORAL | Status: DC

## 2014-04-21 NOTE — Progress Notes (Signed)
04/21/2014 NAYLEAH GAMEL 1950/11/08 [id number]   HPI:  [name] Fry is a 63 y.o. female patient of Dr Gwenlyn Found, with a PMH below who presents today for return hypertension clinic visit.  She has a history of hypertension, hyperlipidemia and diabetes.  Repeated cardiac catheterizations in 2008 and in 2013 showed normal coronary arteries.  She was treated in December 2013 at [facility] for hypertensive emergency and pneumonia.   She has been taking amlodipine 46m, Benicar HCT 40/25, hydralazine 1066mbid and metoprolol 10028mid for the past month.  She states compliance with medications, noting that she has met her annual deductible and her meds are free for the remainder of this year.    She quit smoking in 2005 and does not drink any alcohol or caffeine.  She has been aware of the need for salt restriction for some time and believes she is doing well with this, using Ms DasDeliah Bostond avoiding high sodium foods.  She does not get any regular exercise, but does try to walk in her hallway regularly.  Her diabetes is not well controlled, although no A1c is available, her glucose tends to run in the 200s quite regularly.    She is unaware of any family history of cardiac disease, as she was adopted.  She has 3 children, all in their 30s69snd none have cardiac concerns at this point.     Current Outpatient Prescriptions  Medication Sig Dispense Refill  . amLODipine (NORVASC) 10 MG tablet Take 1 tablet by mouth  daily 90 tablet 3  . aspirin EC 81 MG tablet Take 81 mg by mouth every morning.    . aMarland Kitchenorvastatin (LIPITOR) 20 MG tablet Take 1 tablet (20 mg total) by mouth at bedtime. 90 tablet 2  . fluticasone (FLONASE) 50 MCG/ACT nasal spray Place 2 sprays into the nose daily. 1 g 0  . fluticasone (FLOVENT HFA) 220 MCG/ACT inhaler Inhale 2 puffs into the lungs 2 (two) times daily.    . furosemide (LASIX) 40 MG tablet Take 1 tablet by mouth  daily 90 tablet 3  . hydrALAZINE (APRESOLINE) 100 MG tablet Take 1  tablet (100 mg total) by mouth 2 (two) times daily. 180 tablet 1  . insulin lispro protamine-lispro (HUMALOG 50/50 MIX) (50-50) 100 UNIT/ML SUSP injection Inject 30 Units into the skin every evening.    . insulin lispro protamine-lispro (HUMALOG 75/25 MIX) (75-25) 100 UNIT/ML SUSP injection Inject 60 Units into the skin every morning.    . interferon beta-1a (AVONEX) 30 MCG/0.5ML injection Inject 30 mcg into the muscle every Friday. TAKES QFRIDAY AT 800 PM    . levothyroxine (SYNTHROID) 125 MCG tablet Take 1 tablet (125 mcg total) by mouth daily before breakfast. (Patient taking differently: Take 187.5 mcg by mouth daily before breakfast. ) 60 tablet 3  . metFORMIN (GLUCOPHAGE-XR) 750 MG 24 hr tablet Take 750 mg by mouth 2 (two) times daily.    . metoprolol (LOPRESSOR) 100 MG tablet Take 1 tablet by mouth two  times daily 180 tablet 3  . naproxen sodium (ANAPROX) 220 MG tablet Take 440 mg by mouth 2 (two) times daily as needed (pain).    . oMarland Kitchenmesartan-hydrochlorothiazide (BENICAR HCT) 40-25 MG per tablet Take 1 tablet by mouth daily.    . pantoprazole (PROTONIX) 40 MG tablet Take 1 tablet (40 mg total) by mouth daily. 90 tablet 1  . pregabalin (LYRICA) 75 MG capsule Take 75 mg by mouth at bedtime.    .Marland Kitchen  traZODone (DESYREL) 100 MG tablet Take 200 mg by mouth at bedtime.     No current facility-administered medications for this visit.    Allergies  Allergen Reactions  . Codeine Other (See Comments)    PASS OUT  . Other     Poppy seeds.- EYES SWELL AND HIVES  . Penicillins Other (See Comments)    "PASS OUT, MESSES WITH MY HEART RATE"  . Tape     Pink tape-RASH AND ITCHING    Past Medical History  Diagnosis Date  . Diabetes mellitus   . Hypertension 05/07/2012    Normal 2D Echo; Renal Arterial Doppler 02/02/2007 - less than 60% diameter reduction, left proximal renal artery  . Asthma   . COPD (chronic obstructive pulmonary disease)   . Multiple sclerosis   . Goiter   . Hypertensive  hypertrophic cardiomyopathy, by echo 05/07/12 05/08/2012  . Dyspnea 12/12/2005    CHF and Unspecified Chest pain - Myocardial Perfusion - post stress EF-72, negative for ischemia  . Hx of cardiac catheterization     normal cardiac cath  . Chronic diastolic HF (heart failure) 10/17/2012  . H/O cardiac catheterization 10/17/2012  . Hypothyroidism   . Depression   . Dyslipidemia   . Chronic low back pain   . Peripheral edema   . Neurogenic bladder   . Obesity   . Gait disorder   . GERD (gastroesophageal reflux disease)   . Arthritis   . Complication of anesthesia     HAS ASTHMA FLARES AFTER MOST SURGERIES  . Pneumonia DEC 2013    Blood pressure 196/110, pulse 72, weight 252 lb 9.6 oz (114.579 kg).   ASSESSMENT AND PLAN:  Today her BP remains uncontrolled.  She denies missed doses.   I am going to give her some samples of Edarbyclor 40/12.5 to see if this has any more potency than the Benicar HCTZ.  I will see her again in 1 month, and if she is still hypertensive, will have to consider adding clonidine to her current regimen.     [name] PharmD CPP [facility]

## 2014-04-21 NOTE — Patient Instructions (Signed)
Return for a a follow up appointment in 1 month  Your blood pressure today is 196/110  (goal <140/90)   Check your blood pressure at home daily (if able) and keep record of the readings.  Take your BP meds as follows: switch Benicar hct to edarbychlor 40/12.5 once daily.    Bring all of your meds, your BP cuff and your record of home blood pressures to your next appointment.  Exercise as you're able, try to walk approximately 30 minutes per day.  Keep salt intake to a minimum, especially watch canned and prepared boxed foods.  Eat more fresh fruits and vegetables and fewer canned items.  Avoid eating in fast food restaurants.    HOW TO TAKE YOUR BLOOD PRESSURE: . Rest 5 minutes before taking your blood pressure. .  Don't smoke or drink caffeinated beverages for at least 30 minutes before. . Take your blood pressure before (not after) you eat. . Sit comfortably with your back supported and both feet on the floor (don't cross your legs). . Elevate your arm to heart level on a table or a desk. . Use the proper sized cuff. It should fit smoothly and snugly around your bare upper arm. There should be enough room to slip a fingertip under the cuff. The bottom edge of the cuff should be 1 inch above the crease of the elbow. . Ideally, take 3 measurements at one sitting and record the average.

## 2014-04-22 ENCOUNTER — Encounter: Payer: Self-pay | Admitting: Cardiovascular Disease

## 2014-04-22 ENCOUNTER — Ambulatory Visit (INDEPENDENT_AMBULATORY_CARE_PROVIDER_SITE_OTHER): Payer: 59 | Admitting: Cardiovascular Disease

## 2014-04-22 VITALS — Ht 61.0 in | Wt 252.5 lb

## 2014-04-22 DIAGNOSIS — I5031 Acute diastolic (congestive) heart failure: Secondary | ICD-10-CM

## 2014-04-22 DIAGNOSIS — I10 Essential (primary) hypertension: Secondary | ICD-10-CM

## 2014-04-22 DIAGNOSIS — E785 Hyperlipidemia, unspecified: Secondary | ICD-10-CM

## 2014-04-22 NOTE — Progress Notes (Signed)
04/22/2014 [name] Fry   July 27, 1950  [id number]  Primary Physician [name] Slimmer, MD Primary Cardiologist: [name] Gess MD [name] Fry   HPI:  The patient is a 63 year old mildly overweight married [place] American female, mother of 3, grandmother to 6 grandchildren, formerly a patient of Dr. Fredirick Fry. She has a history of normal coronary arteries by catheterization back in 2008 as well as most recently May 07, 2012, by Dr. Herbie Fry. I saw her one year ago. She has severe hypertension, diabetes, and hyperlipidemia. She was admitted to [facility], December 1 through 10, with hypertensive urgency, was intubated, and also treated for pneumonia. Medicines were adjusted. She is aware of salt restriction. She has history of remote tobacco abuse, having stopped in 2005 .Since I saw her she remains completely stable and denies chest pain or shortness of breath.    Current Outpatient Prescriptions  Medication Sig Dispense Refill  . amLODipine (NORVASC) 10 MG tablet Take 1 tablet by mouth  daily 90 tablet 3  . aspirin EC 81 MG tablet Take 81 mg by mouth every morning.    Marland Kitchen atorvastatin (LIPITOR) 20 MG tablet Take 1 tablet (20 mg total) by mouth at bedtime. 90 tablet 2  . Azilsartan-Chlorthalidone 40-12.5 MG TABS Take 1 tablet by mouth daily. 35 tablet 0  . fluticasone (FLONASE) 50 MCG/ACT nasal spray Place 2 sprays into the nose daily. 1 g 0  . fluticasone (FLOVENT HFA) 220 MCG/ACT inhaler Inhale 2 puffs into the lungs 2 (two) times daily.    . hydrALAZINE (APRESOLINE) 100 MG tablet Take 1 tablet (100 mg total) by mouth 2 (two) times daily. 180 tablet 1  . insulin lispro protamine-lispro (HUMALOG 50/50 MIX) (50-50) 100 UNIT/ML SUSP injection Inject 30 Units into the skin every evening.    . insulin lispro protamine-lispro (HUMALOG 75/25 MIX) (75-25) 100 UNIT/ML SUSP injection Inject 60 Units into the skin every morning.    . interferon beta-1a (AVONEX) 30 MCG/0.5ML  injection Inject 30 mcg into the muscle every Friday. TAKES QFRIDAY AT 800 PM    . levothyroxine (SYNTHROID) 125 MCG tablet Take 1 tablet (125 mcg total) by mouth daily before breakfast. (Patient taking differently: Take 187.5 mcg by mouth daily before breakfast. ) 60 tablet 3  . metFORMIN (GLUCOPHAGE-XR) 750 MG 24 hr tablet Take 750 mg by mouth 2 (two) times daily.    . metoprolol (LOPRESSOR) 100 MG tablet Take 1 tablet by mouth two  times daily 180 tablet 3  . naproxen sodium (ANAPROX) 220 MG tablet Take 440 mg by mouth 2 (two) times daily as needed (pain).    . pantoprazole (PROTONIX) 40 MG tablet Take 1 tablet (40 mg total) by mouth daily. 90 tablet 1  . pregabalin (LYRICA) 75 MG capsule Take 75 mg by mouth at bedtime.    . traZODone (DESYREL) 100 MG tablet Take 200 mg by mouth at bedtime.     No current facility-administered medications for this visit.    Allergies  Allergen Reactions  . Codeine Other (See Comments)    PASS OUT  . Other     Poppy seeds.- EYES SWELL AND HIVES  . Penicillins Other (See Comments)    "PASS OUT, MESSES WITH MY HEART RATE"  . Tape     Pink tape-RASH AND ITCHING    History   Social History  . Marital Status: Married    Spouse Name: [name] Fry    Number of Children: 3  . Years of Education: 2  Occupational History  . Not on file.   Social History Main Topics  . Smoking status: Former Smoker -- 1.00 packs/day for 20 years    Types: Cigarettes    Quit date: 10/24/2003  . Smokeless tobacco: Never Used  . Alcohol Use: No  . Drug Use: No  . Sexual Activity: Not on file   Other Topics Concern  . Not on file   Social History Narrative   Patient is married Company secretary([name] Fry) and lives with her husband and her sister-in-law.   Patient has three children.   Patient is disabled.   Patient has a college education.   Patient is right-handed.   Patient does not drink any caffeine.     Review of Systems: General: negative for chills, fever, night sweats or  weight changes.  Cardiovascular: negative for chest pain, dyspnea on exertion, edema, orthopnea, palpitations, paroxysmal nocturnal dyspnea or shortness of breath Dermatological: negative for rash Respiratory: negative for cough or wheezing Urologic: negative for hematuria Abdominal: negative for nausea, vomiting, diarrhea, bright red blood per rectum, melena, or hematemesis Neurologic: negative for visual changes, syncope, or dizziness All other systems reviewed and are otherwise negative except as noted above.    Height 5\' 1"  (1.549 m), weight 252 lb 8 oz (114.533 kg).  General appearance: alert and no distress Neck: no adenopathy, no carotid bruit, no JVD, supple, symmetrical, trachea midline and thyroid not enlarged, symmetric, no tenderness/mass/nodules Lungs: clear to auscultation bilaterally Heart: regular rate and rhythm, S1, S2 normal, no murmur, click, rub or gallop Extremities: extremities normal, atraumatic, no cyanosis or edema  EKG not performed today  ASSESSMENT AND PLAN:   Uncontrolled hypertension Her blood pressure today in the office was measured at 138/88. She is on is Azilsartan- chlorthalidone, hydralazine and metoprolol as well as amlodipine. Continue current medications.  Hyperlipidemia On atorvastatin 20 mg daily followed by her PCP. Continue current medications  Acute diastolic CHF (congestive heart failure) Normal LV systolic function with diastolic dysfunction by 2-D echocardiography. Well compensated on current medications      [name] GessJonathan J. Berry MD [facility] LLCFACP,FACC,FAHA, [facility] CenterFSCAI 04/22/2014 2:46 PM

## 2014-04-22 NOTE — Patient Instructions (Signed)
Your physician wants you to follow-up in 1 year with Dr. Berry. You will receive a reminder letter in the mail 2 months in advance. If you do not receive a letter, please call our office to schedule the follow-up appointment.  

## 2014-04-22 NOTE — Assessment & Plan Note (Signed)
Her blood pressure today in the office was measured at 138/88. She is on is Azilsartan- chlorthalidone, hydralazine and metoprolol as well as amlodipine. Continue current medications.

## 2014-04-22 NOTE — Assessment & Plan Note (Signed)
Normal LV systolic function with diastolic dysfunction by 2-D echocardiography. Well compensated on current medications

## 2014-04-22 NOTE — Assessment & Plan Note (Signed)
On atorvastatin 20 mg daily followed by her PCP. Continue current medications

## 2014-04-29 ENCOUNTER — Other Ambulatory Visit: Payer: Self-pay | Admitting: Cardiovascular Disease

## 2014-04-29 NOTE — Telephone Encounter (Signed)
Rx refill sent to patient pharmacy   

## 2014-05-07 ENCOUNTER — Encounter: Payer: Self-pay | Admitting: Cardiovascular Disease

## 2014-05-07 ENCOUNTER — Ambulatory Visit (INDEPENDENT_AMBULATORY_CARE_PROVIDER_SITE_OTHER): Payer: 59 | Admitting: Cardiovascular Disease

## 2014-05-07 VITALS — BP 192/104 | HR 95 | Ht 61.0 in | Wt 254.4 lb

## 2014-05-07 DIAGNOSIS — R209 Unspecified disturbances of skin sensation: Secondary | ICD-10-CM

## 2014-05-07 DIAGNOSIS — R208 Other disturbances of skin sensation: Secondary | ICD-10-CM

## 2014-05-07 NOTE — Assessment & Plan Note (Signed)
The patient was referred back to me by Dr. Chestine Spore for evaluation of "cold feet". Patient says that the bottom of her feet are somewhat numb. She denies claudication. She does have 2+ pedal pulses on exam and her feet are warm to touch. I'm going to get lower extremity arterial Doppler studies but I doubt whether she has significant PAD.

## 2014-05-07 NOTE — Progress Notes (Signed)
The patient was referred back by Dr. Chestine Spore, her primary care provider, for evaluation of "cold feet". I saw her 04/22/14. She is diabetic. She denies claudication. 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 She has 2+ pedal pulses on exam and warm feet to touch. I'm going to get lower extremity arterial Doppler studies. I will see her back in one year.  Runell Gess, M.D., FACP, [facility], Earl Lagos [facility] [address] [place], [place]  [address]  [phone] 05/07/2014 11:41 AM

## 2014-05-07 NOTE — Patient Instructions (Signed)
[name] has ordered for you to have:  Lower extremity arterial doppler- During this test, ultrasound is used to evaluate arterial blood flow in the legs. Allow approximately one hour for this exam.   Your physician wants you to follow-up in 1 year with [name]. You will receive a reminder letter in the mail 2 months in advance. If you do not receive a letter, please call our office to schedule the follow-up appointment.

## 2014-05-15 ENCOUNTER — Encounter (HOSPITAL_COMMUNITY): Payer: Self-pay | Admitting: Cardiology

## 2014-05-20 ENCOUNTER — Other Ambulatory Visit: Payer: Self-pay | Admitting: Neurology

## 2014-05-21 ENCOUNTER — Ambulatory Visit (INDEPENDENT_AMBULATORY_CARE_PROVIDER_SITE_OTHER): Payer: 59 | Admitting: Pharmacist Clinician (PhC)/ Clinical Pharmacy Specialist

## 2014-05-21 VITALS — BP 176/120 | HR 80 | Ht 61.0 in | Wt 256.3 lb

## 2014-05-21 DIAGNOSIS — I10 Essential (primary) hypertension: Secondary | ICD-10-CM

## 2014-05-21 NOTE — Patient Instructions (Signed)
Return for a a follow up appointment in 3 weeks  Your blood pressure today is 176/120   Check your blood pressure at home daily (if able) and keep record of the readings.  Take your BP meds as follows: stop edarbyclor 40/12.5 and start edarbi 80 mg each day.  Move your amlodipine 10mg  from mornings to evenings  Bring all of your meds, your BP cuff and your record of home blood pressures to your next appointment.  Exercise as you're able, try to walk approximately 30 minutes per day.  Keep salt intake to a minimum, especially watch canned and prepared boxed foods.  Eat more fresh fruits and vegetables and fewer canned items.  Avoid eating in fast food restaurants.    HOW TO TAKE YOUR BLOOD PRESSURE: . Rest 5 minutes before taking your blood pressure. .  Don't smoke or drink caffeinated beverages for at least 30 minutes before. . Take your blood pressure before (not after) you eat. . Sit comfortably with your back supported and both feet on the floor (don't cross your legs). . Elevate your arm to heart level on a table or a desk. . Use the proper sized cuff. It should fit smoothly and snugly around your bare upper arm. There should be enough room to slip a fingertip under the cuff. The bottom edge of the cuff should be 1 inch above the crease of the elbow. . Ideally, take 3 measurements at one sitting and record the average.

## 2014-05-22 ENCOUNTER — Inpatient Hospital Stay (HOSPITAL_COMMUNITY): Admission: RE | Admit: 2014-05-22 | Payer: 59 | Source: Ambulatory Visit

## 2014-05-23 ENCOUNTER — Encounter: Payer: Self-pay | Admitting: Pharmacist Clinician (PhC)/ Clinical Pharmacy Specialist

## 2014-05-23 MED ORDER — AZILSARTAN MEDOXOMIL 80 MG PO TABS: 80.0000 mg | ORAL_TABLET | Freq: Every day | ORAL | Status: DC

## 2014-05-23 NOTE — Progress Notes (Signed)
05/23/2014 [name] Fry 07-01-1950 [id number]   HPI:  [name] Fry is a 63 y.o. female patient of Dr Gwenlyn Found, with a PMH below who presents today for return hypertension clinic visit.  She has a history of hypertension, hyperlipidemia and diabetes.  Repeated cardiac catheterizations in 2008 and in 2013 showed normal coronary arteries.  She was treated in December 2013 at [facility] for hypertensive emergency and pneumonia.   When I saw her last her pressure was still elevated at 196/110.  I switched her Benicar HCT 40/25 to samples of Edarbiclor 40/12.5.  She saw Dr. Gwenlyn Found after just one dose and her pressure was down to 138/88.  However after another couple of weeks it was back up to 192/104.     Current blood pressure medications include amlodipine 10mg , Edarbiclor 40/12.5, hydralazine 100mg  bid and metoprolol 100mg  bid for the past month.  She states compliance with medications, noting that she has met her annual deductible and her meds are free for the remainder of this year.    She quit smoking in 2005 and does not drink any alcohol or caffeine.  She has been aware of the need for salt restriction for some time and believes she is doing well with this, using [name] and avoiding high sodium foods.  She does not get any regular exercise, but does try to walk in her hallway regularly.  Her diabetes is not well controlled, although no A1c is available, her glucose tends to run in the 200s quite regularly.    She is unaware of any family history of cardiac disease, as she was adopted.  She has 3 children, all in their 66s, and none have cardiac concerns at this point.     Current Outpatient Prescriptions  Medication Sig Dispense Refill  . amLODipine (NORVASC) 10 MG tablet Take 1 tablet by mouth  daily 90 tablet 3  . aspirin EC 81 MG tablet Take 81 mg by mouth every morning.    Marland Kitchen atorvastatin (LIPITOR) 20 MG tablet Take 1 tablet (20 mg total) by mouth at bedtime. 90 tablet 2  . AVONEX  PREFILLED 30 MCG/0.5ML PSKT injection Inject intramuscularly  42mcg every week. 1 kit 3  . Azilsartan-Chlorthalidone 40-12.5 MG TABS Take 1 tablet by mouth daily. 35 tablet 0  . fluticasone (FLONASE) 50 MCG/ACT nasal spray Place 2 sprays into the nose daily. 1 g 0  . fluticasone (FLOVENT HFA) 220 MCG/ACT inhaler Inhale 2 puffs into the lungs 2 (two) times daily.    . hydrALAZINE (APRESOLINE) 100 MG tablet Take 1 tablet (100 mg total) by mouth 2 (two) times daily. 180 tablet 1  . insulin lispro protamine-lispro (HUMALOG 50/50 MIX) (50-50) 100 UNIT/ML SUSP injection Inject 30 Units into the skin every evening.    . insulin lispro protamine-lispro (HUMALOG 75/25 MIX) (75-25) 100 UNIT/ML SUSP injection Inject 60 Units into the skin every morning.    . interferon beta-1a (AVONEX) 30 MCG/0.5ML injection Inject 30 mcg into the muscle every Friday. TAKES QFRIDAY AT 800 PM    . levothyroxine (SYNTHROID) 125 MCG tablet Take 1 tablet (125 mcg total) by mouth daily before breakfast. (Patient taking differently: Take 187.5 mcg by mouth daily before breakfast. ) 60 tablet 3  . metFORMIN (GLUCOPHAGE-XR) 750 MG 24 hr tablet Take 750 mg by mouth 2 (two) times daily.    . metoprolol (LOPRESSOR) 100 MG tablet Take 1 tablet by mouth two  times daily 180 tablet 3  . naproxen sodium (  ANAPROX) 220 MG tablet Take 440 mg by mouth 2 (two) times daily as needed (pain).    . pantoprazole (PROTONIX) 40 MG tablet Take 1 tablet by mouth  daily 90 tablet 2  . pregabalin (LYRICA) 75 MG capsule Take 75 mg by mouth at bedtime.    . traZODone (DESYREL) 100 MG tablet Take 200 mg by mouth at bedtime.     No current facility-administered medications for this visit.    Allergies  Allergen Reactions  . Codeine Other (See Comments)    PASS OUT  . Other     Poppy seeds.- EYES SWELL AND HIVES  . Penicillins Other (See Comments)    "PASS OUT, MESSES WITH MY HEART RATE"  . Tape     Pink tape-RASH AND ITCHING    Past Medical  History  Diagnosis Date  . Diabetes mellitus   . Hypertension 05/07/2012    Normal 2D Echo; Renal Arterial Doppler 02/02/2007 - less than 60% diameter reduction, left proximal renal artery  . Asthma   . COPD (chronic obstructive pulmonary disease)   . Multiple sclerosis   . Goiter   . Hypertensive hypertrophic cardiomyopathy, by echo 05/07/12 05/08/2012  . Dyspnea 12/12/2005    CHF and Unspecified Chest pain - Myocardial Perfusion - post stress EF-72, negative for ischemia  . Hx of cardiac catheterization     normal cardiac cath  . Chronic diastolic HF (heart failure) 10/17/2012  . H/O cardiac catheterization 10/17/2012  . Hypothyroidism   . Depression   . Dyslipidemia   . Chronic low back pain   . Peripheral edema   . Neurogenic bladder   . Obesity   . Gait disorder   . GERD (gastroesophageal reflux disease)   . Arthritis   . Complication of anesthesia     HAS ASTHMA FLARES AFTER MOST SURGERIES  . Pneumonia DEC 2013    Blood pressure 176/120, pulse 80, height $RemoveBe'5\' 1"'[id number]$  (1.549 m), weight 256 lb 4.8 oz (116.257 kg).   ASSESSMENT AND PLAN:  Her blood pressure remains elevated despite multiple medications.  She states that a few home readings were mostly normal, although we have not tested her meter in office for accuracy.  Looking thru Epic she seems to have had fairly well controlled pressures until her recent visits in Cardiology.   I have asked that she bring her home cuff with her to the next visit.  I am going to give her samples of Edarby 80 mg to take for now, and have her switch the amlodipine to evenings.  I will see her back in 3 weeks.     [name] PharmD CPP [facility]

## 2014-05-28 ENCOUNTER — Ambulatory Visit (HOSPITAL_COMMUNITY)
Admission: RE | Admit: 2014-05-28 | Discharge: 2014-05-28 | Disposition: A | Discharge status: Home / Self Care | Payer: 59 | Source: Ambulatory Visit | Attending: Cardiovascular Disease | Admitting: Cardiovascular Disease

## 2014-05-28 DIAGNOSIS — R208 Other disturbances of skin sensation: Secondary | ICD-10-CM | POA: Insufficient documentation

## 2014-05-28 DIAGNOSIS — R209 Unspecified disturbances of skin sensation: Secondary | ICD-10-CM

## 2014-05-28 NOTE — Progress Notes (Signed)
Lower Extremity Arterial Duplex Completed. °Brianna L Mazza,RVT °

## 2014-05-30 ENCOUNTER — Other Ambulatory Visit: Payer: Self-pay | Admitting: Neurology

## 2014-06-11 ENCOUNTER — Telehealth: Payer: Self-pay | Admitting: Cardiovascular Disease

## 2014-06-11 NOTE — Telephone Encounter (Signed)
Pt would like her doppler results from 05-28-14 please.

## 2014-06-11 NOTE — Telephone Encounter (Signed)
Forward to [place] Williamson RN Results are not available.

## 2014-06-12 ENCOUNTER — Encounter: Payer: Self-pay | Admitting: Adult Health

## 2014-06-12 ENCOUNTER — Ambulatory Visit (INDEPENDENT_AMBULATORY_CARE_PROVIDER_SITE_OTHER): Payer: 59 | Admitting: Adult Health

## 2014-06-12 ENCOUNTER — Other Ambulatory Visit: Payer: Self-pay | Admitting: Cardiovascular Disease

## 2014-06-12 VITALS — BP 190/103 | HR 97 | Ht 61.0 in | Wt 256.0 lb

## 2014-06-12 DIAGNOSIS — Z5181 Encounter for therapeutic drug level monitoring: Secondary | ICD-10-CM

## 2014-06-12 DIAGNOSIS — G35 Multiple sclerosis: Secondary | ICD-10-CM

## 2014-06-12 DIAGNOSIS — R269 Unspecified abnormalities of gait and mobility: Secondary | ICD-10-CM

## 2014-06-12 MED ORDER — TRAZODONE HCL 100 MG PO TABS: 100.0000 mg | ORAL_TABLET | Freq: Every day | ORAL | Status: DC

## 2014-06-12 NOTE — Patient Instructions (Signed)
Today we will recheck blood work. I will fax a copy of results to Dr. Chestine Spore We will repeat MRI of the brain- someone from our office will contact you with an appointment  Follow-up with a medical supply company for changes in your wheels on your walker.  If you develop any new symptoms please let us know.

## 2014-06-12 NOTE — Progress Notes (Signed)
PATIENT: [name] Fry DOB: 1950/12/01  REASON FOR VISIT: follow up HISTORY FROM: patient  HISTORY OF PRESENT ILLNESS: [name] Fry is a 64 year old female with a history of multiple aggressive and abnormality of gait. [name] Fry returns today for follow-up. the patient is currently taking Avonex and tolerating it well. Patient denies any new numbness or weakness. [name] Fry states that [name] Fry has been having cold feet and cold legs that started in the fall this year. [name] Fry had dopplers of her legs but have not gotten those results back.   No  changes with her gait or balance. [name] Fry states that [name] Fry is slower walking in the winter time. [name] Fry does use a walker to ambulate. Patient had a fall in December but it was due to her walker. Her [employer] nurse told [name] Fry needed to get bigger wheels on her walker.  Denies any new issues with the bowels or bladder. No changes in her fatigue level. [name] Fry states that [name] Fry had blood work with Dr. Carlis Abbott- [name] Fry states that her hgb was low and her platelets were zero? [name] Fry was referred to a hematologist but has not been yet. [name] Fry states that [name] Fry bled for 21 days in November for her menstrual cycle. Patient continues to use trazodone to help her sleep at night. [name] Fry states overall this is working well for her.  HISTORY 12/09/13: [name] Fry is a 64 year old female with a history of multiple sclerosis and abnormality of gait. [name] Fry returns today for followup. The patient currently takes Avonex and is tolerating it well. [name] Fry denies any new numbness or weakness. Patient states that [name] Fry has some hand tremors on the right side. [name] Fry states that [name] Fry only notices it when [name] Fry is trying to use her hand. The patient uses a walker to ambulate and denies falls. Patient denies an increase in fatigue. Patient denies any trouble with swallowing. Patient states that [name] Fry had tyroid removed 12/02/13. Patient denies any new issues with bowels or bladder. [name] Fry states [name] Fry used to take Detrol but has been out of the  prescription for the last 3 months. No new medical issues since last seen.  REVIEW OF SYSTEMS: Out of a complete 14 system review of symptoms, the patient complains only of the following symptoms, and all other reviewed systems are negative.  Fatigue, cold intolerance, food allergies, walking difficulty  ALLERGIES: Allergies  Allergen Reactions  . Codeine Other (See Comments)    PASS OUT  . Other     Poppy seeds.- EYES SWELL AND HIVES  . Penicillins Other (See Comments)    "PASS OUT, MESSES WITH MY HEART RATE"  . Tape     Pink tape-RASH AND ITCHING    HOME MEDICATIONS: Outpatient Prescriptions Prior to Visit  Medication Sig Dispense Refill  . amLODipine (NORVASC) 10 MG tablet Take 1 tablet by mouth  daily 90 tablet 3  . aspirin EC 81 MG tablet Take 81 mg by mouth every morning.    Marland Kitchen atorvastatin (LIPITOR) 20 MG tablet Take 1 tablet by mouth at  bedtime 90 tablet 2  . AVONEX PREFILLED 30 MCG/0.5ML PSKT injection Inject intramuscularly  51mg every week. 1 kit 3  . Azilsartan Medoxomil 80 MG TABS Take 1 tablet (80 mg total) by mouth daily. 35 tablet 0  . Azilsartan-Chlorthalidone 40-12.5 MG TABS Take 1 tablet by mouth daily. 35 tablet 0  . fluticasone (FLONASE) 50 MCG/ACT nasal spray Place 2 sprays into the nose daily. 1 g 0  . fluticasone (FLOVENT HFA) 220 MCG/ACT inhaler  Inhale 2 puffs into the lungs 2 (two) times daily.    . hydrALAZINE (APRESOLINE) 100 MG tablet Take 1 tablet (100 mg total) by mouth 2 (two) times daily. 180 tablet 1  . insulin lispro protamine-lispro (HUMALOG 50/50 MIX) (50-50) 100 UNIT/ML SUSP injection Inject 30 Units into the skin every evening.    . insulin lispro protamine-lispro (HUMALOG 75/25 MIX) (75-25) 100 UNIT/ML SUSP injection Inject 60 Units into the skin every morning.    . interferon beta-1a (AVONEX) 30 MCG/0.5ML injection Inject 30 mcg into the muscle every Friday. TAKES QFRIDAY AT 800 PM    . levothyroxine (SYNTHROID) 125 MCG tablet Take 1 tablet  (125 mcg total) by mouth daily before breakfast. (Patient taking differently: Take 187.5 mcg by mouth daily before breakfast. ) 60 tablet 3  . metFORMIN (GLUCOPHAGE-XR) 750 MG 24 hr tablet Take 750 mg by mouth 2 (two) times daily.    . metoprolol (LOPRESSOR) 100 MG tablet Take 1 tablet by mouth two  times daily 180 tablet 3  . naproxen sodium (ANAPROX) 220 MG tablet Take 440 mg by mouth 2 (two) times daily as needed (pain).    . pantoprazole (PROTONIX) 40 MG tablet Take 1 tablet by mouth  daily 90 tablet 2  . pregabalin (LYRICA) 75 MG capsule Take 75 mg by mouth at bedtime.    . traZODone (DESYREL) 100 MG tablet Take 2 tablets (200 mg   total) by mouth at bedtime  as needed. 180 tablet 0   No facility-administered medications prior to visit.    PAST MEDICAL HISTORY: Past Medical History  Diagnosis Date  . Diabetes mellitus   . Hypertension 05/07/2012    Normal 2D Echo; Renal Arterial Doppler 02/02/2007 - less than 60% diameter reduction, left proximal renal artery  . Asthma   . COPD (chronic obstructive pulmonary disease)   . Multiple sclerosis   . Goiter   . Hypertensive hypertrophic cardiomyopathy, by echo 05/07/12 05/08/2012  . Dyspnea 12/12/2005    CHF and Unspecified Chest pain - Myocardial Perfusion - post stress EF-72, negative for ischemia  . Hx of cardiac catheterization     normal cardiac cath  . Chronic diastolic HF (heart failure) 10/17/2012  . H/O cardiac catheterization 10/17/2012  . Hypothyroidism   . Depression   . Dyslipidemia   . Chronic low back pain   . Peripheral edema   . Neurogenic bladder   . Obesity   . Gait disorder   . GERD (gastroesophageal reflux disease)   . Arthritis   . Complication of anesthesia     HAS ASTHMA FLARES AFTER MOST SURGERIES  . Pneumonia DEC 2013    PAST SURGICAL HISTORY: Past Surgical History  Procedure Laterality Date  . Cardiac catheterization Left 05/06/2012    Moderate caliber vessel from mid LAD  . Cardiac catheterization  Right 04/02/2007    Left ventricular hypertrophy, no coronary disease, no renal artery stenosis  . Cesarean section       3x -[id number] AND 1985  . Cholecystectomy      1973  . Breast surgery      2 tumors removed -right breast  . Appendectomy      1973  . Arthoscopic rotaor cuff repair Right 1995  . Joint replacement       right shoulder 1985   . Thyroidectomy N/A 11/01/2013    Procedure: THYROIDECTOMY;  Surgeon: Odis Hollingshead, MD;  Location: [facility];  Service: General;  Laterality: N/A;  . Left heart catheterization  with coronary angiogram N/A 05/07/2012    Procedure: LEFT HEART CATHETERIZATION WITH CORONARY ANGIOGRAM;  Surgeon: Leonie Man, MD;  Location: [facility] CATH LAB;  Service: Cardiovascular;  Laterality: N/A;    FAMILY HISTORY: Family History  Problem Relation Age of Onset  . Coronary artery disease Father 32    MI  . Diabetes Father   . Heart disease Mother   . Diabetes Mother     SOCIAL HISTORY: History   Social History  . Marital Status: Married    Spouse Name: Johnny    Number of Children: 3  . Years of Education: 14   Occupational History  . Not on file.   Social History Main Topics  . Smoking status: Former Smoker -- 1.00 packs/day for 20 years    Types: Cigarettes    Quit date: 10/24/2003  . Smokeless tobacco: Never Used  . Alcohol Use: No  . Drug Use: No  . Sexual Activity: Not on file   Other Topics Concern  . Not on file   Social History Narrative   Patient is married Biomedical engineer) and lives with her husband and her sister-in-law.   Patient has three children.   Patient is disabled.   Patient has a college education.   Patient is right-handed.   Patient does not drink any caffeine.      PHYSICAL EXAM  Filed Vitals:   06/12/14 1444  BP: 190/103  Pulse: 97  Height: $Remove'5\' 1"'VWLYELF$  (1.549 m)  Weight: 256 lb (116.121 kg)   Body mass index is 48.4 kg/(m^2). Generalized: Well developed, in no acute distress, obese   Neurological examination    Mentation: Alert oriented to time, place, history taking. Follows all commands speech and language fluent Cranial nerve II-XII: Pupils were equal round reactive to light. Extraocular movements were full with exception of right eye- exotropia of the right eye with incomplete abduction. Facial sensation and strength were normal. Uvula tongue midline. Head turning and shoulder shrug  were normal and symmetric. Motor: The motor testing reveals 5 over 5 strength of all 4 extremities. Good symmetric motor tone is noted throughout.  Sensory: Sensory testing is intact to soft touch on all 4 extremities. No evidence of extinction is noted.  Coordination: Cerebellar testing reveals good finger-nose-finger and heel-to-shin bilaterally.  Gait and station: [name] Fry is able to stand from a sitting position without assistance.Gait is slightly wide-based. [name] Fry uses a walker to ambulate. Tandem gait not attempted. Romberg is negative what unsteady. Reflexes: Deep tendon reflexes are symmetric but depressed throughout  DIAGNOSTIC DATA (LABS, IMAGING, TESTING) - I reviewed patient records, labs, notes, testing and imaging myself where available.  Lab Results  Component Value Date   WBC 12.7* 12/09/2013   HGB 13.4 12/09/2013   HCT 39.6 12/09/2013   MCV 89 12/09/2013   PLT 127* 12/09/2013      Component Value Date/Time   NA 137 12/09/2013 1358   NA 133* 11/03/2013 0825   K 4.3 12/09/2013 1358   CL 100 12/09/2013 1358   CO2 29 12/09/2013 1358   GLUCOSE 257* 12/09/2013 1358   GLUCOSE 258* 11/03/2013 0825   BUN 10 12/09/2013 1358   BUN 23 11/03/2013 0825   CREATININE 0.76 12/09/2013 1358   CREATININE 0.94 10/17/2012 1502   CALCIUM 9.7 12/09/2013 1358   PROT 7.0 12/09/2013 1358   PROT 8.1 10/23/2013 1412   ALBUMIN 3.4* 10/23/2013 1412   AST 14 12/09/2013 1358   ALT 17 12/09/2013 1358   ALKPHOS 105 12/09/2013  1358   BILITOT 0.8 12/09/2013 1358   GFRNONAA 85 12/09/2013 1358   GFRAA 98 12/09/2013 1358    Lab Results  Component Value Date   TRIG 135 05/15/2012   No results found for: HGBA1C Lab Results  Component Value Date   VITAMINB12 850 12/05/2012   Lab Results  Component Value Date   TSH 0.886 12/05/2012      ASSESSMENT AND PLAN 64 y.o. year old female  has a past medical history of Diabetes mellitus; Hypertension (05/07/2012); Asthma; COPD (chronic obstructive pulmonary disease); Multiple sclerosis; Goiter; Hypertensive hypertrophic cardiomyopathy, by echo 05/07/12 (05/08/2012); Dyspnea (12/12/2005); cardiac catheterization; Chronic diastolic HF (heart failure) (10/17/2012); H/O cardiac catheterization (10/17/2012); Hypothyroidism; Depression; Dyslipidemia; Chronic low back pain; Peripheral edema; Neurogenic bladder; Obesity; Gait disorder; GERD (gastroesophageal reflux disease); Arthritis; Complication of anesthesia; and Pneumonia ([place] 2013). here with:  1. Multiple sclerosis 2. Abnormality of gait  Overall the patient is doing well. [name] Fry will continue taking Avonex. No refills needed today.I will check blood work today- CBC and CMP. I will also repeat  a MRI of the brain to look for any progression of MS. The patient uses trazodone at night for sleep. [name] Fry states this is working well. I will refill that today.I have encouraged the patient to follow up with a medical supply company on getting the wheels of her walker adjusted. [name] Fry verbalized understanding. I also counseled the patient on her diet and the importance of losing weight. I encouraged the patient to try  smaller portion sizes. The patient verbalized understanding.if her symptoms worsen or [name] Fry develops new symptoms [name] Fry should let us know. Otherwise [name] Fry will follow up in 6 months or sooner if needed.     Ward Givens, MSN, NP-C 06/12/2014, 2:37 PM [facility] [address], [place], [place] [address] [phone]  Note: This document was prepared with digital dictation and possible smart phrase  technology. Any transcriptional errors that result from this process are unintentional.

## 2014-06-12 NOTE — Progress Notes (Signed)
I have read the note, and I agree with the clinical assessment and plan.  WILLIS,CHARLES KEITH   

## 2014-06-13 ENCOUNTER — Ambulatory Visit (INDEPENDENT_AMBULATORY_CARE_PROVIDER_SITE_OTHER): Payer: 59 | Admitting: Pharmacist Clinician (PhC)/ Clinical Pharmacy Specialist

## 2014-06-13 ENCOUNTER — Telehealth: Payer: Self-pay | Admitting: Adult Health

## 2014-06-13 VITALS — BP 160/100 | HR 72 | Ht 61.0 in

## 2014-06-13 DIAGNOSIS — I10 Essential (primary) hypertension: Secondary | ICD-10-CM

## 2014-06-13 LAB — COMPREHENSIVE METABOLIC PANEL
ALT: 31 IU/L (ref 0–32)
AST: 41 IU/L — AB (ref 0–40)
Albumin/Globulin Ratio: 1.4 (ref 1.1–2.5)
Albumin: 4.6 g/dL (ref 3.6–4.8)
Alkaline Phosphatase: 82 IU/L (ref 39–117)
BUN/Creatinine Ratio: 10 — ABNORMAL LOW (ref 11–26)
BUN: 11 mg/dL (ref 8–27)
CALCIUM: 10.2 mg/dL (ref 8.7–10.3)
CHLORIDE: 94 mmol/L — AB (ref 97–108)
CO2: 26 mmol/L (ref 18–29)
Creatinine, Ser: 1.15 mg/dL — ABNORMAL HIGH (ref 0.57–1.00)
GFR calc Af Amer: 59 mL/min/{1.73_m2} — ABNORMAL LOW (ref >59)
GFR calc non Af Amer: 51 mL/min/{1.73_m2} — ABNORMAL LOW (ref >59)
GLOBULIN, TOTAL: 3.3 g/dL (ref 1.5–4.5)
Glucose: 327 mg/dL — ABNORMAL HIGH (ref 65–99)
Potassium: 4.2 mmol/L (ref 3.5–5.2)
Sodium: 138 mmol/L (ref 134–144)
Total Bilirubin: 0.9 mg/dL (ref 0.0–1.2)
Total Protein: 7.9 g/dL (ref 6.0–8.5)

## 2014-06-13 LAB — CBC WITH DIFFERENTIAL
BASOS ABS: 0 10*3/uL (ref 0.0–0.2)
Basos: 0 %
EOS ABS: 0.3 10*3/uL (ref 0.0–0.4)
Eos: 3 %
HEMATOCRIT: 45.3 % (ref 34.0–46.6)
HEMOGLOBIN: 15 g/dL (ref 11.1–15.9)
Immature Grans (Abs): 0 10*3/uL (ref 0.0–0.1)
Immature Granulocytes: 0 %
Lymphocytes Absolute: 3.8 10*3/uL — ABNORMAL HIGH (ref 0.7–3.1)
Lymphs: 36 %
MCH: 32.2 pg (ref 26.6–33.0)
MCHC: 33.1 g/dL (ref 31.5–35.7)
MCV: 97 fL (ref 79–97)
Monocytes Absolute: 0.6 10*3/uL (ref 0.1–0.9)
Monocytes: 6 %
Neutrophils Absolute: 5.8 10*3/uL (ref 1.4–7.0)
Neutrophils Relative %: 55 %
Platelets: 96 10*3/uL — CL (ref 150–379)
RBC: 4.66 x10E6/uL (ref 3.77–5.28)
RDW: 15.7 % — ABNORMAL HIGH (ref 12.3–15.4)
WBC: 10.5 10*3/uL (ref 3.4–10.8)

## 2014-06-13 MED ORDER — HYDRALAZINE HCL 100 MG PO TABS: 100.0000 mg | ORAL_TABLET | Freq: Three times a day (TID) | ORAL | Status: DC

## 2014-06-13 NOTE — Patient Instructions (Signed)
Return for a a follow up appointment in 1 month   Your blood pressure today is 160/100  Check your blood pressure at home daily (if able) and keep record of the readings.  Take your BP meds as follows - increase hydralazine to  three times daily  Bring all of your meds, your BP cuff and your record of home blood pressures to your next appointment.  Exercise as you're able, try to walk approximately 30 minutes per day.  Keep salt intake to a minimum, especially watch canned and prepared boxed foods.  Eat more fresh fruits and vegetables and fewer canned items.  Avoid eating in fast food restaurants.    HOW TO TAKE YOUR BLOOD PRESSURE: . Rest 5 minutes before taking your blood pressure. .  Don't smoke or drink caffeinated beverages for at least 30 minutes before. . Take your blood pressure before (not after) you eat. . Sit comfortably with your back supported and both feet on the floor (don't cross your legs). . Elevate your arm to heart level on a table or a desk. . Use the proper sized cuff. It should fit smoothly and snugly around your bare upper arm. There should be enough room to slip a fingertip under the cuff. The bottom edge of the cuff should be 1 inch above the crease of the elbow. . Ideally, take 3 measurements at one sitting and record the average.

## 2014-06-13 NOTE — Telephone Encounter (Signed)
I called the patient. Her platelets are low however have consulted with Dr. Anne Hahn in this time we will continue to monitor her platelets. The patient's kidney function is abnormal however she is diabetic. I will forward her lab work to her primary care provider. Patient is aware of her results and verbalized understanding.

## 2014-06-16 ENCOUNTER — Telehealth: Payer: Self-pay | Admitting: Neurology

## 2014-06-16 NOTE — Telephone Encounter (Signed)
Patient is calling to get an order for a walker with bigger wheels faxed to [facility] at [phone].

## 2014-06-16 NOTE — Telephone Encounter (Signed)
Called patient and left a message to call back.

## 2014-06-16 NOTE — Telephone Encounter (Signed)
Returning your call. °

## 2014-06-17 ENCOUNTER — Encounter: Payer: Self-pay | Admitting: Pharmacist Clinician (PhC)/ Clinical Pharmacy Specialist

## 2014-06-17 MED ORDER — AZILSARTAN MEDOXOMIL 80 MG PO TABS: 80.0000 mg | ORAL_TABLET | Freq: Every day | ORAL | Status: DC

## 2014-06-17 NOTE — Progress Notes (Signed)
06/17/2014 [name] Fry August 07, 1950 [id number]   HPI:  [name] Fry is a 64 y.o. female patient of Dr Gwenlyn Found, with a PMH below who presents today for return hypertension clinic visit.  She has a history of hypertension, hyperlipidemia and diabetes.  Repeated cardiac catheterizations in 2008 and in 2013 showed normal coronary arteries.  She was treated in December 2013 at [facility] for hypertensive emergency and pneumonia.   When I saw her last her pressure was still elevated at 196/110.  I switched her Benicar HCT 40/25 to samples of Edarbiclor 40/12.5.  She saw Dr. Gwenlyn Found after just one dose and her pressure was down to 138/88.  However after another couple of weeks it was back up to 192/104.  At her next appointment with me I switched her to [place]at 80 mg daily from the [place].  She continues with the amlodipine 10 mg daily, hydralazine and metoprolol, both at 100 mg bid.       She quit smoking in 2005 and does not drink any alcohol or caffeine.  She has been aware of the need for salt restriction for some time and believes she is doing well with this, using [name] and avoiding high sodium foods.  She does not get any regular exercise, but does try to walk in her hallway regularly.  Her diabetes is not well controlled, although no A1c is available, her glucose tends to run in the 200s quite regularly.    She is unaware of any family history of cardiac disease, as she was adopted.  She has 3 children, all in their 74s, and none have cardiac concerns at this point.    She recently purchased a home blood pressure cuff, a wrist model.  She has only taken 2 readings with it, 204/125 and 213/124.    Current Outpatient Prescriptions  Medication Sig Dispense Refill  . amLODipine (NORVASC) 10 MG tablet Take 1 tablet by mouth  daily 90 tablet 3  . aspirin EC 81 MG tablet Take 81 mg by mouth every morning.    Marland Kitchen atorvastatin (LIPITOR) 20 MG tablet Take 1 tablet by mouth at  bedtime 90 tablet 2  .  AVONEX PREFILLED 30 MCG/0.5ML PSKT injection Inject intramuscularly  30mcg every week. 1 kit 3  . Azilsartan Medoxomil 80 MG TABS Take 1 tablet (80 mg total) by mouth daily. 35 tablet 0  . Azilsartan-Chlorthalidone 40-12.5 MG TABS Take 1 tablet by mouth daily. 35 tablet 0  . fluticasone (FLONASE) 50 MCG/ACT nasal spray Place 2 sprays into the nose daily. 1 g 0  . fluticasone (FLOVENT HFA) 220 MCG/ACT inhaler Inhale 2 puffs into the lungs 2 (two) times daily.    . hydrALAZINE (APRESOLINE) 100 MG tablet Take 1 tablet (100 mg total) by mouth 3 (three) times daily. 270 tablet 1  . insulin lispro protamine-lispro (HUMALOG 50/50 MIX) (50-50) 100 UNIT/ML SUSP injection Inject 30 Units into the skin every evening.    . insulin lispro protamine-lispro (HUMALOG 75/25 MIX) (75-25) 100 UNIT/ML SUSP injection Inject 60 Units into the skin every morning.    . interferon beta-1a (AVONEX) 30 MCG/0.5ML injection Inject 30 mcg into the muscle every Friday. TAKES QFRIDAY AT 800 PM    . levothyroxine (SYNTHROID) 125 MCG tablet Take 1 tablet (125 mcg total) by mouth daily before breakfast. (Patient taking differently: Take 187.5 mcg by mouth daily before breakfast. ) 60 tablet 3  . metFORMIN (GLUCOPHAGE-XR) 750 MG 24 hr tablet Take 750 mg  by mouth 2 (two) times daily.    . metoprolol (LOPRESSOR) 100 MG tablet Take 1 tablet by mouth two  times daily 180 tablet 3  . naproxen sodium (ANAPROX) 220 MG tablet Take 440 mg by mouth 2 (two) times daily as needed (pain).    . pantoprazole (PROTONIX) 40 MG tablet Take 1 tablet by mouth  daily 90 tablet 2  . pregabalin (LYRICA) 75 MG capsule Take 75 mg by mouth at bedtime.    . traZODone (DESYREL) 100 MG tablet Take 1 tablet (100 mg total) by mouth at bedtime. 180 tablet 1   No current facility-administered medications for this visit.    Allergies  Allergen Reactions  . Codeine Other (See Comments)    PASS OUT  . Other     Poppy seeds.- EYES SWELL AND HIVES  . Penicillins  Other (See Comments)    "PASS OUT, MESSES WITH MY HEART RATE"  . Tape     Pink tape-RASH AND ITCHING    Past Medical History  Diagnosis Date  . Diabetes mellitus   . Hypertension 05/07/2012    Normal 2D Echo; Renal Arterial Doppler 02/02/2007 - less than 60% diameter reduction, left proximal renal artery  . Asthma   . COPD (chronic obstructive pulmonary disease)   . Multiple sclerosis   . Goiter   . Hypertensive hypertrophic cardiomyopathy, by echo 05/07/12 05/08/2012  . Dyspnea 12/12/2005    CHF and Unspecified Chest pain - Myocardial Perfusion - post stress EF-72, negative for ischemia  . Hx of cardiac catheterization     normal cardiac cath  . Chronic diastolic HF (heart failure) 10/17/2012  . H/O cardiac catheterization 10/17/2012  . Hypothyroidism   . Depression   . Dyslipidemia   . Chronic low back pain   . Peripheral edema   . Neurogenic bladder   . Obesity   . Gait disorder   . GERD (gastroesophageal reflux disease)   . Arthritis   . Complication of anesthesia     HAS ASTHMA FLARES AFTER MOST SURGERIES  . Pneumonia DEC 2013    Blood pressure 160/100, pulse 72, height $RemoveBe'5\' 1"'TRydwUAIG$  (1.549 m).   ASSESSMENT AND PLAN:  Her blood pressure remains elevated despite multiple medications.  She states compliance with her medications.  Her home cuff, when used correctly, read 20 points higher systolic.  She was educated on proper technique for the cuff, however I'm not sure any of the readings will be accurate.  I will increase her hydralazine to 100 mg tid and see her back in 1 month.  If we are unable to control her pressure with this regimen, will consider adding clonidine.  [name] PharmD CPP [facility]

## 2014-06-17 NOTE — Telephone Encounter (Signed)
I spoke to [facility] and they relayed that the order can be written on a script to say rolling walker with seat.  Those are the ones that have 5 inch wheels.  I can fax order with insurance info to [facility].  I also spoke to patient and she verbalized understanding about the type of walker.

## 2014-06-18 ENCOUNTER — Other Ambulatory Visit: Payer: Self-pay | Admitting: Neurology

## 2014-06-18 DIAGNOSIS — G35 Multiple sclerosis: Secondary | ICD-10-CM

## 2014-06-18 DIAGNOSIS — R269 Unspecified abnormalities of gait and mobility: Secondary | ICD-10-CM

## 2014-06-19 NOTE — Telephone Encounter (Signed)
Order has been faxed to [name] Fry and confirmation received.

## 2014-06-20 NOTE — Telephone Encounter (Signed)
Results were released to mychart.

## 2014-06-26 DIAGNOSIS — G35 Multiple sclerosis: Secondary | ICD-10-CM

## 2014-07-01 ENCOUNTER — Other Ambulatory Visit: Payer: Self-pay | Admitting: Diagnostic Neuroimaging

## 2014-07-01 DIAGNOSIS — G35 Multiple sclerosis: Secondary | ICD-10-CM

## 2014-07-14 ENCOUNTER — Ambulatory Visit (INDEPENDENT_AMBULATORY_CARE_PROVIDER_SITE_OTHER): Payer: 59 | Admitting: Pharmacist Clinician (PhC)/ Clinical Pharmacy Specialist

## 2014-07-14 ENCOUNTER — Encounter: Payer: Self-pay | Admitting: Pharmacist Clinician (PhC)/ Clinical Pharmacy Specialist

## 2014-07-14 VITALS — BP 160/110 | HR 80 | Wt 254.0 lb

## 2014-07-14 DIAGNOSIS — I10 Essential (primary) hypertension: Secondary | ICD-10-CM

## 2014-07-14 MED ORDER — CLONIDINE HCL 0.1 MG PO TABS: 0.1000 mg | ORAL_TABLET | Freq: Two times a day (BID) | ORAL | Status: DC

## 2014-07-14 NOTE — Progress Notes (Signed)
07/14/2014 SHAMIA UPPAL 25-Feb-1951 [id number]   HPI:  [name] Fry is a 64 y.o. female patient of Dr Gwenlyn Found, with a PMH below who presents today for return hypertension clinic visit.  She has a history of hypertension, hyperlipidemia and diabetes.  Repeated cardiac catheterizations in 2008 and in 2013 showed normal coronary arteries.  She was treated in December 2013 at [facility] for hypertensive emergency and pneumonia.   We have yet to get her BP down to normal levels.  She continues with the edarby 80 mg daily, amlodipine 10 mg daily, hydralazine 100 mg tid, metoprolol 100 mg bid.    She states compliance with all medications, and brought her medication bottles with her today.  The bottles are quite dated, the amlodipine bottle is dated 06/2013 and the hydralazine 10.2015.  She states they mail her meds every 3 months and she pours the old bottle into the new.  I'm not sure if this means she's not compliant or she's just pouring meds into old bottles.   She quit smoking in 2005 and does not drink any alcohol or caffeine.  She has been aware of the need for salt restriction for some time and believes she is doing well with this, using [name] and avoiding high sodium foods.  She does not get any regular exercise, but does try to walk in her hallway regularly.  Her diabetes is not well controlled, although no A1c is available, her glucose tends to run in the 200s quite regularly.    She is unaware of any family history of cardiac disease, as she was adopted.  She has 3 children, all in their 65s, and none have cardiac concerns at this point.    Her home BP cuff (wrist model) shows almost half of the 30 readings >622 systolic and close to 2/3 with diastolic >297.  Current Outpatient Prescriptions  Medication Sig Dispense Refill  . amLODipine (NORVASC) 10 MG tablet Take 1 tablet by mouth  daily 90 tablet 3  . aspirin EC 81 MG tablet Take 81 mg by mouth every morning.    Marland Kitchen atorvastatin (LIPITOR)  20 MG tablet Take 1 tablet by mouth at  bedtime 90 tablet 2  . AVONEX PREFILLED 30 MCG/0.5ML PSKT injection Inject intramuscularly  27mg every week. 1 kit 3  . Azilsartan Medoxomil 80 MG TABS Take 1 tablet (80 mg total) by mouth daily. 30 tablet 3  . Azilsartan-Chlorthalidone 40-12.5 MG TABS Take 1 tablet by mouth daily. 35 tablet 0  . fluticasone (FLONASE) 50 MCG/ACT nasal spray Place 2 sprays into the nose daily. 1 g 0  . fluticasone (FLOVENT HFA) 220 MCG/ACT inhaler Inhale 2 puffs into the lungs 2 (two) times daily.    . hydrALAZINE (APRESOLINE) 100 MG tablet Take 1 tablet (100 mg total) by mouth 3 (three) times daily. 270 tablet 1  . insulin lispro protamine-lispro (HUMALOG 50/50 MIX) (50-50) 100 UNIT/ML SUSP injection Inject 30 Units into the skin every evening.    . insulin lispro protamine-lispro (HUMALOG 75/25 MIX) (75-25) 100 UNIT/ML SUSP injection Inject 60 Units into the skin every morning.    . interferon beta-1a (AVONEX) 30 MCG/0.5ML injection Inject 30 mcg into the muscle every Friday. TAKES QFRIDAY AT 800 PM    . levothyroxine (SYNTHROID) 125 MCG tablet Take 1 tablet (125 mcg total) by mouth daily before breakfast. (Patient taking differently: Take 187.5 mcg by mouth daily before breakfast. ) 60 tablet 3  . metFORMIN (GLUCOPHAGE-XR) 750  MG 24 hr tablet Take 750 mg by mouth 2 (two) times daily.    . metoprolol (LOPRESSOR) 100 MG tablet Take 1 tablet by mouth two  times daily 180 tablet 3  . naproxen sodium (ANAPROX) 220 MG tablet Take 440 mg by mouth 2 (two) times daily as needed (pain).    . pantoprazole (PROTONIX) 40 MG tablet Take 1 tablet by mouth  daily 90 tablet 2  . pregabalin (LYRICA) 75 MG capsule Take 75 mg by mouth at bedtime.    . traZODone (DESYREL) 100 MG tablet Take 1 tablet (100 mg total) by mouth at bedtime. 180 tablet 1   No current facility-administered medications for this visit.    Allergies  Allergen Reactions  . Codeine Other (See Comments)    PASS OUT    . Other     Poppy seeds.- EYES SWELL AND HIVES  . Penicillins Other (See Comments)    "PASS OUT, MESSES WITH MY HEART RATE"  . Tape     Pink tape-RASH AND ITCHING    Past Medical History  Diagnosis Date  . Diabetes mellitus   . Hypertension 05/07/2012    Normal 2D Echo; Renal Arterial Doppler 02/02/2007 - less than 60% diameter reduction, left proximal renal artery  . Asthma   . COPD (chronic obstructive pulmonary disease)   . Multiple sclerosis   . Goiter   . Hypertensive hypertrophic cardiomyopathy, by echo 05/07/12 05/08/2012  . Dyspnea 12/12/2005    CHF and Unspecified Chest pain - Myocardial Perfusion - post stress EF-72, negative for ischemia  . Hx of cardiac catheterization     normal cardiac cath  . Chronic diastolic HF (heart failure) 10/17/2012  . H/O cardiac catheterization 10/17/2012  . Hypothyroidism   . Depression   . Dyslipidemia   . Chronic low back pain   . Peripheral edema   . Neurogenic bladder   . Obesity   . Gait disorder   . GERD (gastroesophageal reflux disease)   . Arthritis   . Complication of anesthesia     HAS ASTHMA FLARES AFTER MOST SURGERIES  . Pneumonia DEC 2013    Blood pressure 160/110, pulse 80, weight 254 lb (115.214 kg).   ASSESSMENT AND PLAN:  Her blood pressure remains elevated despite multiple medications.  Previous notes show her home BP cuff read about 20 points higher than office readings.  She states compliance with her medications, but with bottles dating to various times in 2015, I don't know quite what to believe.  Today I will add clonidine 0.1 mg twice daily and see her back in another month.  [name] PharmD CPP [facility]

## 2014-07-14 NOTE — Patient Instructions (Signed)
Return for a a follow up appointment in 1 month  Your blood pressure today is 160/110  Check your blood pressure at home daily (if able) and keep record of the readings.  Take your BP meds as follows:  AM -  edarby 80 mg, amlodipine 10 mg, hydralazine 100 mg, CLONIDINE 0.1 MG Mid day - hydralazine 100 mg PM - hydralazine 100 mg, CLONIDINE 0.1 MG  Bring all of your meds, your BP cuff and your record of home blood pressures to your next appointment.  Exercise as you're able, try to walk approximately 30 minutes per day.  Keep salt intake to a minimum, especially watch canned and prepared boxed foods.  Eat more fresh fruits and vegetables and fewer canned items.  Avoid eating in fast food restaurants.    HOW TO TAKE YOUR BLOOD PRESSURE: . Rest 5 minutes before taking your blood pressure. .  Don't smoke or drink caffeinated beverages for at least 30 minutes before. . Take your blood pressure before (not after) you eat. . Sit comfortably with your back supported and both feet on the floor (don't cross your legs). . Elevate your arm to heart level on a table or a desk. . Use the proper sized cuff. It should fit smoothly and snugly around your bare upper arm. There should be enough room to slip a fingertip under the cuff. The bottom edge of the cuff should be 1 inch above the crease of the elbow. . Ideally, take 3 measurements at one sitting and record the average.

## 2014-08-11 ENCOUNTER — Ambulatory Visit (INDEPENDENT_AMBULATORY_CARE_PROVIDER_SITE_OTHER): Payer: 59 | Admitting: Pharmacist Clinician (PhC)/ Clinical Pharmacy Specialist

## 2014-08-11 VITALS — BP 128/84 | Ht 61.0 in | Wt 254.3 lb

## 2014-08-11 DIAGNOSIS — I10 Essential (primary) hypertension: Secondary | ICD-10-CM

## 2014-08-11 MED ORDER — AZILSARTAN MEDOXOMIL 80 MG PO TABS: 80.0000 mg | ORAL_TABLET | Freq: Every day | ORAL | Status: DC

## 2014-08-11 NOTE — Patient Instructions (Signed)
Return for a a follow up appointment in 3 months   Your blood pressure today is 128/84  (goal is <140/90)  Check your blood pressure at home daily (if able) and keep record of the readings.  Take your BP meds as follows: continue all medications  Bring all of your meds, your BP cuff and your record of home blood pressures to your next appointment.  Exercise as you're able, try to walk approximately 30 minutes per day.  Keep salt intake to a minimum, especially watch canned and prepared boxed foods.  Eat more fresh fruits and vegetables and fewer canned items.  Avoid eating in fast food restaurants.    HOW TO TAKE YOUR BLOOD PRESSURE: . Rest 5 minutes before taking your blood pressure. .  Don't smoke or drink caffeinated beverages for at least 30 minutes before. . Take your blood pressure before (not after) you eat. . Sit comfortably with your back supported and both feet on the floor (don't cross your legs). . Elevate your arm to heart level on a table or a desk. . Use the proper sized cuff. It should fit smoothly and snugly around your bare upper arm. There should be enough room to slip a fingertip under the cuff. The bottom edge of the cuff should be 1 inch above the crease of the elbow. . Ideally, take 3 measurements at one sitting and record the average.

## 2014-08-12 ENCOUNTER — Encounter: Payer: Self-pay | Admitting: Pharmacist Clinician (PhC)/ Clinical Pharmacy Specialist

## 2014-08-12 NOTE — Progress Notes (Signed)
08/12/2014 [name] Fry 1950-12-12 [id number]   HPI:  [name] Fry is a 64 y.o. female patient of Dr Gwenlyn Found, with a PMH below who presents today for return hypertension clinic visit.  She has a history of hypertension, hyperlipidemia and diabetes.  Repeated cardiac catheterizations in 2008 and in 2013 showed normal coronary arteries.  She was treated in December 2013 at [facility] for hypertensive emergency and pneumonia.   We have had difficulties getting her BP down to normal levels.  She continues with the edarby 80 mg daily, amlodipine 10 mg daily, hydralazine 100 mg tid, clonidine 0.1 mg bid and metoprolol 100 mg bid.    She states compliance with all medications.   She quit smoking in 2005 and does not drink any alcohol or caffeine.  She has been aware of the need for salt restriction for some time and believes she is doing well with this, using [name] Fry and avoiding high sodium foods.  She does not get any regular exercise, but does try to walk in her hallway regularly.  Her diabetes is not well controlled, although no A1c is available, her glucose tends to run in the 200s quite regularly.    She is unaware of any family history of cardiac disease, as she was adopted.  She has 3 children, all in their 36s, and none have cardiac concerns at this point.    Her home BP cuff (wrist model) shows an interesting mix of readings, with the highest being 255/128 and the lowest at 100/60. Ten of the readings are with systolic >786.  I suspect some of the variance in readings is due to the wrist cuff itself as well as patient technique.  When compared to office readings her cuff was about 20 points higher than office cuff.  Current Outpatient Prescriptions  Medication Sig Dispense Refill  . ergocalciferol (VITAMIN D2) 50000 UNITS capsule Take 50,000 Units by mouth 2 (two) times a week.    Marland Kitchen amLODipine (NORVASC) 10 MG tablet Take 1 tablet by mouth  daily 90 tablet 3  . aspirin EC 81 MG tablet Take 81  mg by mouth every morning.    Marland Kitchen atorvastatin (LIPITOR) 20 MG tablet Take 1 tablet by mouth at  bedtime 90 tablet 2  . AVONEX PREFILLED 30 MCG/0.5ML PSKT injection Inject intramuscularly  33mg every week. 1 kit 3  . Azilsartan Medoxomil 80 MG TABS Take 1 tablet (80 mg total) by mouth daily. 90 tablet 1  . cloNIDine (CATAPRES) 0.1 MG tablet Take 1 tablet (0.1 mg total) by mouth 2 (two) times daily. 180 tablet 2  . fluticasone (FLONASE) 50 MCG/ACT nasal spray Place 2 sprays into the nose daily. 1 g 0  . fluticasone (FLOVENT HFA) 220 MCG/ACT inhaler Inhale 2 puffs into the lungs 2 (two) times daily.    . hydrALAZINE (APRESOLINE) 100 MG tablet Take 1 tablet (100 mg total) by mouth 3 (three) times daily. 270 tablet 1  . insulin lispro protamine-lispro (HUMALOG 50/50 MIX) (50-50) 100 UNIT/ML SUSP injection Inject 30 Units into the skin every evening.    . insulin lispro protamine-lispro (HUMALOG 75/25 MIX) (75-25) 100 UNIT/ML SUSP injection Inject 60 Units into the skin every morning.    . interferon beta-1a (AVONEX) 30 MCG/0.5ML injection Inject 30 mcg into the muscle every Friday. TAKES QFRIDAY AT 800 PM    . levothyroxine (SYNTHROID) 125 MCG tablet Take 1 tablet (125 mcg total) by mouth daily before breakfast. (Patient taking differently: Take  187.5 mcg by mouth daily before breakfast. ) 60 tablet 3  . metFORMIN (GLUCOPHAGE-XR) 750 MG 24 hr tablet Take 750 mg by mouth 2 (two) times daily.    . metoprolol (LOPRESSOR) 100 MG tablet Take 1 tablet by mouth two  times daily 180 tablet 3  . naproxen sodium (ANAPROX) 220 MG tablet Take 440 mg by mouth 2 (two) times daily as needed (pain).    . pantoprazole (PROTONIX) 40 MG tablet Take 1 tablet by mouth  daily 90 tablet 2  . pregabalin (LYRICA) 75 MG capsule Take 75 mg by mouth at bedtime.    . traZODone (DESYREL) 100 MG tablet Take 1 tablet (100 mg total) by mouth at bedtime. 180 tablet 1   No current facility-administered medications for this visit.     Allergies  Allergen Reactions  . Codeine Other (See Comments)    PASS OUT  . Other     Poppy seeds.- EYES SWELL AND HIVES  . Penicillins Other (See Comments)    "PASS OUT, MESSES WITH MY HEART RATE"  . Tape     Pink tape-RASH AND ITCHING    Past Medical History  Diagnosis Date  . Diabetes mellitus   . Hypertension 05/07/2012    Normal 2D Echo; Renal Arterial Doppler 02/02/2007 - less than 60% diameter reduction, left proximal renal artery  . Asthma   . COPD (chronic obstructive pulmonary disease)   . Multiple sclerosis   . Goiter   . Hypertensive hypertrophic cardiomyopathy, by echo 05/07/12 05/08/2012  . Dyspnea 12/12/2005    CHF and Unspecified Chest pain - Myocardial Perfusion - post stress EF-72, negative for ischemia  . Hx of cardiac catheterization     normal cardiac cath  . Chronic diastolic HF (heart failure) 10/17/2012  . H/O cardiac catheterization 10/17/2012  . Hypothyroidism   . Depression   . Dyslipidemia   . Chronic low back pain   . Peripheral edema   . Neurogenic bladder   . Obesity   . Gait disorder   . GERD (gastroesophageal reflux disease)   . Arthritis   . Complication of anesthesia     HAS ASTHMA FLARES AFTER MOST SURGERIES  . Pneumonia DEC 2013    Blood pressure 128/84, height 5' 1" (1.549 m), weight 254 lb 4.8 oz (115.35 kg).   ASSESSMENT AND PLAN:  Her blood pressure today is at goal.  Previous notes show her home BP cuff read about 20 points higher than office readings.  She states compliance with her medications.  I praised her excellent reading today.  Encouraged her to continue with her current regimen and I will see her again in 3 months  [name] PharmD CPP [place]

## 2014-09-12 ENCOUNTER — Other Ambulatory Visit: Payer: Self-pay | Admitting: Adult Health

## 2014-09-12 NOTE — Telephone Encounter (Signed)
Patient has always taken 2 at bedtime.

## 2014-09-16 ENCOUNTER — Telehealth: Payer: Self-pay | Admitting: Adult Health

## 2014-09-16 DIAGNOSIS — D696 Thrombocytopenia, unspecified: Secondary | ICD-10-CM

## 2014-09-16 NOTE — Telephone Encounter (Signed)
Patient needs to come in for lab work to have platelets rechecked unless she has had recent lab work with her hematologist or PCP. Orders have been placed. Thanks.

## 2014-09-30 ENCOUNTER — Telehealth: Payer: Self-pay | Admitting: Pharmacist Clinician (PhC)/ Clinical Pharmacy Specialist

## 2014-09-30 ENCOUNTER — Encounter: Payer: Self-pay | Admitting: Pharmacist Clinician (PhC)/ Clinical Pharmacy Specialist

## 2014-09-30 NOTE — Telephone Encounter (Signed)
Closed encounter °

## 2014-11-10 ENCOUNTER — Ambulatory Visit: Payer: 59 | Admitting: Pharmacist Clinician (PhC)/ Clinical Pharmacy Specialist

## 2014-11-12 ENCOUNTER — Ambulatory Visit: Payer: 59 | Admitting: Pharmacist Clinician (PhC)/ Clinical Pharmacy Specialist

## 2014-11-13 ENCOUNTER — Encounter: Payer: Self-pay | Admitting: Pharmacist Clinician (PhC)/ Clinical Pharmacy Specialist

## 2014-11-13 ENCOUNTER — Ambulatory Visit (INDEPENDENT_AMBULATORY_CARE_PROVIDER_SITE_OTHER): Payer: 59 | Admitting: Pharmacist Clinician (PhC)/ Clinical Pharmacy Specialist

## 2014-11-13 VITALS — BP 170/104 | HR 84 | Ht 61.0 in | Wt 244.2 lb

## 2014-11-13 DIAGNOSIS — I10 Essential (primary) hypertension: Secondary | ICD-10-CM

## 2014-11-13 NOTE — Patient Instructions (Signed)
Return for a a follow up appointment in 1 month  Your blood pressure today is 170/104   Check your blood pressure at home daily (if able) and keep record of the readings.  Take your BP meds as follows: take an extra clonidine 0.1 mg tablet when you get home today, then continue with all current medications  Bring all of your meds, your BP cuff and your record of home blood pressures to your next appointment.  Exercise as you're able, try to walk approximately 30 minutes per day.  Keep salt intake to a minimum, especially watch canned and prepared boxed foods.  Eat more fresh fruits and vegetables and fewer canned items.  Avoid eating in fast food restaurants.    HOW TO TAKE YOUR BLOOD PRESSURE: . Rest 5 minutes before taking your blood pressure. .  Don't smoke or drink caffeinated beverages for at least 30 minutes before. . Take your blood pressure before (not after) you eat. . Sit comfortably with your back supported and both feet on the floor (don't cross your legs). . Elevate your arm to heart level on a table or a desk. . Use the proper sized cuff. It should fit smoothly and snugly around your bare upper arm. There should be enough room to slip a fingertip under the cuff. The bottom edge of the cuff should be 1 inch above the crease of the elbow. . Ideally, take 3 measurements at one sitting and record the average.

## 2014-11-13 NOTE — Progress Notes (Signed)
11/13/2014 [name] Fry 25-Sep-1950 [id number]   HPI:  [name] Fry is a 64 y.o. female patient of Dr Gwenlyn Found, with a PMH below who presents today for return hypertension clinic visit.  Her history is significant for hypertension, hyperlipidemia and diabetes.  Repeated cardiac catheterizations in 2008 and in 2013 showed normal coronary arteries.  She was treated in December 2013 at [facility] for hypertensive emergency and pneumonia.   She has very labile blood pressures and we've had her on maximum therapy for some time.  She continues with the edarby 80 mg daily, amlodipine 10 mg daily, hydralazine 100 mg tid, clonidine 0.1 mg bid and metoprolol 100 mg bid.    She states compliance with all medications.   She quit smoking in 2005 and does not drink any alcohol or caffeine.  She has been aware of the need for salt restriction for some time and believes she is doing well with this, using [name] and avoiding high sodium foods.  She does not get any regular exercise, but does try to walk in her hallway regularly.  Her diabetes is not well controlled, although no A1c is available, her glucose tends to run in the 200s quite regularly.    She is unaware of any family history of cardiac disease, as she was adopted.  She has 3 children, all in their 16s, and none have cardiac concerns at this point.    She has a new BP cuff that she brings in today.  She has not opened it as of yet.  It does come with a cuff to appropriately fit her arm.  Unfortunately when we tried it out, it read an error that, according to the manual, is due to excessive difference between systolic and diastolic readings.    Today she comes in down 11 pounds since her last visit.  She states she has been walking more, in grocery stores, but otherwise has not specifically been trying to lose weight.  Today she come in after having been walking in [place].    Also of note, she had a fall about a month ago and did some damage to her  right knee.  She has been seeing Ortho for this and just recently had injections in her knee.  She states that her BP was also close to 938 systolic at that appointment, but she assumed it was because she was about to get injections.  Current Outpatient Prescriptions  Medication Sig Dispense Refill  . amLODipine (NORVASC) 10 MG tablet Take 1 tablet by mouth  daily 90 tablet 3  . aspirin EC 81 MG tablet Take 81 mg by mouth every morning.    Marland Kitchen atorvastatin (LIPITOR) 20 MG tablet Take 1 tablet by mouth at  bedtime 90 tablet 2  . AVONEX PREFILLED 30 MCG/0.5ML PSKT injection Inject intramuscularly  27mcg every week. 1 kit 3  . Azilsartan Medoxomil 80 MG TABS Take 1 tablet (80 mg total) by mouth daily. 90 tablet 1  . cloNIDine (CATAPRES) 0.1 MG tablet Take 1 tablet (0.1 mg total) by mouth 2 (two) times daily. 180 tablet 2  . ergocalciferol (VITAMIN D2) 50000 UNITS capsule Take 50,000 Units by mouth 2 (two) times a week.    . fluticasone (FLONASE) 50 MCG/ACT nasal spray Place 2 sprays into the nose daily. 1 g 0  . fluticasone (FLOVENT HFA) 220 MCG/ACT inhaler Inhale 2 puffs into the lungs 2 (two) times daily.    . hydrALAZINE (APRESOLINE) 100 MG  tablet Take 1 tablet (100 mg total) by mouth 3 (three) times daily. 270 tablet 1  . insulin lispro protamine-lispro (HUMALOG 50/50 MIX) (50-50) 100 UNIT/ML SUSP injection Inject 30 Units into the skin every evening.    . insulin lispro protamine-lispro (HUMALOG 75/25 MIX) (75-25) 100 UNIT/ML SUSP injection Inject 60 Units into the skin every morning.    . interferon beta-1a (AVONEX) 30 MCG/0.5ML injection Inject 30 mcg into the muscle every Friday. TAKES QFRIDAY AT 800 PM    . levothyroxine (SYNTHROID) 125 MCG tablet Take 1 tablet (125 mcg total) by mouth daily before breakfast. (Patient taking differently: Take 187.5 mcg by mouth daily before breakfast. ) 60 tablet 3  . metFORMIN (GLUCOPHAGE-XR) 750 MG 24 hr tablet Take 750 mg by mouth 2 (two) times daily.    .  metoprolol (LOPRESSOR) 100 MG tablet Take 1 tablet by mouth two  times daily 180 tablet 3  . naproxen sodium (ANAPROX) 220 MG tablet Take 440 mg by mouth 2 (two) times daily as needed (pain).    . pantoprazole (PROTONIX) 40 MG tablet Take 1 tablet by mouth  daily 90 tablet 2  . pregabalin (LYRICA) 75 MG capsule Take 75 mg by mouth at bedtime.    . traZODone (DESYREL) 100 MG tablet Take 2 tablets by mouth at  bedtime 180 tablet 1   No current facility-administered medications for this visit.    Allergies  Allergen Reactions  . Codeine Other (See Comments)    PASS OUT  . Other     Poppy seeds.- EYES SWELL AND HIVES  . Penicillins Other (See Comments)    "PASS OUT, MESSES WITH MY HEART RATE"  . Tape     Pink tape-RASH AND ITCHING    Past Medical History  Diagnosis Date  . Diabetes mellitus   . Hypertension 05/07/2012    Normal 2D Echo; Renal Arterial Doppler 02/02/2007 - less than 60% diameter reduction, left proximal renal artery  . Asthma   . COPD (chronic obstructive pulmonary disease)   . Multiple sclerosis   . Goiter   . Hypertensive hypertrophic cardiomyopathy, by echo 05/07/12 05/08/2012  . Dyspnea 12/12/2005    CHF and Unspecified Chest pain - Myocardial Perfusion - post stress EF-72, negative for ischemia  . Hx of cardiac catheterization     normal cardiac cath  . Chronic diastolic HF (heart failure) 10/17/2012  . H/O cardiac catheterization 10/17/2012  . Hypothyroidism   . Depression   . Dyslipidemia   . Chronic low back pain   . Peripheral edema   . Neurogenic bladder   . Obesity   . Gait disorder   . GERD (gastroesophageal reflux disease)   . Arthritis   . Complication of anesthesia     HAS ASTHMA FLARES AFTER MOST SURGERIES  . Pneumonia DEC 2013    Blood pressure 170/104, pulse 84, height $RemoveBe'5\' 1"'[id number]$  (1.549 m), weight 244 lb 3.2 oz (110.768 kg).   ASSESSMENT AND PLAN:  Today her BP is quite elevated at 170/104.  She will go home and take an extra clonidine 0.1 mg  tablet today.  She doesn't appear to be in any pain from her knee injury, so I am not sure what the cause of her elevated pressure is today.  She continues to state full compliance with all her medications.  I did praise her 11 pound weight loss and encouraged her to continue with the walking, explaining that exercise and weight loss can have positive effects for her.  I will have her continue with her current regimen and see her again in 1 month to see if her BP drops.    [name] PharmD CPP [facility]

## 2014-11-14 ENCOUNTER — Other Ambulatory Visit: Payer: Self-pay | Admitting: Cardiovascular Disease

## 2014-11-14 NOTE — Telephone Encounter (Signed)
MED REFILLED  

## 2014-11-28 ENCOUNTER — Other Ambulatory Visit: Payer: Self-pay | Admitting: Cardiovascular Disease

## 2014-11-28 ENCOUNTER — Other Ambulatory Visit: Payer: Self-pay | Admitting: Pharmacist Clinician (PhC)/ Clinical Pharmacy Specialist

## 2014-11-28 NOTE — Telephone Encounter (Signed)
Rx has been sent to the pharmacy electronically. ° °

## 2014-11-28 NOTE — Telephone Encounter (Signed)
Rx(s) sent to pharmacy electronically.  

## 2014-12-01 ENCOUNTER — Other Ambulatory Visit: Payer: Self-pay

## 2014-12-18 ENCOUNTER — Ambulatory Visit (INDEPENDENT_AMBULATORY_CARE_PROVIDER_SITE_OTHER): Payer: Commercial Managed Care - HMO | Admitting: Pharmacist Clinician (PhC)/ Clinical Pharmacy Specialist

## 2014-12-18 VITALS — BP 158/98 | HR 88 | Ht 61.0 in | Wt 237.1 lb

## 2014-12-18 DIAGNOSIS — I10 Essential (primary) hypertension: Secondary | ICD-10-CM | POA: Diagnosis not present

## 2014-12-18 NOTE — Patient Instructions (Signed)
Return for a a follow up appointment in 1 month  Your blood pressure today is 158/98  (goal is < 140/90)  Check your blood pressure at home daily and keep record of the readings.  Take your BP meds as follows: continue with all meds, increase clonidine to 0.2 mg (2 tablets) twice daily.  Great job on the weight loss.  Continue your walking!  Bring all of your meds, your BP cuff and your record of home blood pressures to your next appointment.  Exercise as you're able, try to walk approximately 30 minutes per day.  Keep salt intake to a minimum, especially watch canned and prepared boxed foods.  Eat more fresh fruits and vegetables and fewer canned items.  Avoid eating in fast food restaurants.    HOW TO TAKE YOUR BLOOD PRESSURE: . Rest 5 minutes before taking your blood pressure. .  Don't smoke or drink caffeinated beverages for at least 30 minutes before. . Take your blood pressure before (not after) you eat. . Sit comfortably with your back supported and both feet on the floor (don't cross your legs). . Elevate your arm to heart level on a table or a desk. . Use the proper sized cuff. It should fit smoothly and snugly around your bare upper arm. There should be enough room to slip a fingertip under the cuff. The bottom edge of the cuff should be 1 inch above the crease of the elbow. . Ideally, take 3 measurements at one sitting and record the average.

## 2014-12-19 ENCOUNTER — Encounter: Payer: Self-pay | Admitting: Pharmacist Clinician (PhC)/ Clinical Pharmacy Specialist

## 2014-12-19 MED ORDER — CLONIDINE HCL 0.2 MG PO TABS: 0.2000 mg | ORAL_TABLET | Freq: Two times a day (BID) | ORAL | Status: DC

## 2014-12-19 NOTE — Progress Notes (Signed)
12/19/2014 [name] Fry 04/14/1951 [id number]   HPI:  [name] Fry is a 64 y.o. female patient of Dr Gwenlyn Found, with a PMH below who presents today for return hypertension clinic visit.  Her history is significant for hypertension, hyperlipidemia and diabetes.  Repeated cardiac catheterizations in 2008 and in 2013 showed normal coronary arteries.  She was treated in December 2013 at [facility] for hypertensive emergency and pneumonia.   She has very labile blood pressures and we've had her on maximum therapy for some time.  She continues with the edarby 80 mg daily, amlodipine 10 mg daily, hydralazine 100 mg tid, clonidine 0.1 mg bid and metoprolol 100 mg bid.    She states compliance with all medications.   She quit smoking in 2005 and does not drink any alcohol or caffeine.  She has been aware of the need for salt restriction for some time and believes she is doing well with this, using [name] and avoiding high sodium foods.  Over the past couple of months she has started walking more frequently.  She will walk for 20-30 minutes in a store or up and down her street.  Since starting this she has lost 18 pounds, as today she is down another 7.   Her diabetes is not well controlled, although no A1c is available, her glucose tends to run in the 200s quite regularly.    She is unaware of any family history of cardiac disease, as she was adopted.  She has 3 children, all in their 10s, and none have cardiac concerns at this point.    Her home blood pressure readings are still mostly elevated.  In the past month only 4 of 36 readings were <163 systolic, and only one of those <140.  Diastolic readings also are high, with only 2 <90.    Current Outpatient Prescriptions  Medication Sig Dispense Refill  . liothyronine (CYTOMEL) 25 MCG tablet Take 50 mcg by mouth 2 (two) times daily.    Marland Kitchen amLODipine (NORVASC) 10 MG tablet Take 1 tablet by mouth  daily 90 tablet 1  . aspirin EC 81 MG tablet Take 81 mg by  mouth every morning.    Marland Kitchen atorvastatin (LIPITOR) 20 MG tablet Take 1 tablet by mouth at  bedtime 90 tablet 1  . AVONEX PREFILLED 30 MCG/0.5ML PSKT injection Inject intramuscularly  67mcg every week. 1 kit 3  . cloNIDine (CATAPRES) 0.1 MG tablet Take 1 tablet (0.1 mg total) by mouth 2 (two) times daily. 180 tablet 2  . EDARBI 80 MG TABS Take 1 tablet by mouth  daily 90 tablet 1  . ergocalciferol (VITAMIN D2) 50000 UNITS capsule Take 50,000 Units by mouth 2 (two) times a week.    . fluticasone (FLONASE) 50 MCG/ACT nasal spray Place 2 sprays into the nose daily. 1 g 0  . fluticasone (FLOVENT HFA) 220 MCG/ACT inhaler Inhale 2 puffs into the lungs 2 (two) times daily.    . hydrALAZINE (APRESOLINE) 100 MG tablet Take 1 tablet (100 mg total) by mouth 3 (three) times daily. 270 tablet 1  . hydrALAZINE (APRESOLINE) 100 MG tablet Take 1 tablet by mouth 3  times daily 270 tablet 1  . insulin lispro protamine-lispro (HUMALOG 50/50 MIX) (50-50) 100 UNIT/ML SUSP injection Inject 30 Units into the skin every evening.    . insulin lispro protamine-lispro (HUMALOG 75/25 MIX) (75-25) 100 UNIT/ML SUSP injection Inject 60 Units into the skin every morning.    . interferon beta-1a (  AVONEX) 30 MCG/0.5ML injection Inject 30 mcg into the muscle every Friday. TAKES QFRIDAY AT 800 PM    . metFORMIN (GLUCOPHAGE-XR) 750 MG 24 hr tablet Take 750 mg by mouth 2 (two) times daily.    . metoprolol (LOPRESSOR) 100 MG tablet Take 1 tablet by mouth two  times daily 180 tablet 1  . naproxen sodium (ANAPROX) 220 MG tablet Take 440 mg by mouth 2 (two) times daily as needed (pain).    . pantoprazole (PROTONIX) 40 MG tablet Take 1 tablet by mouth  daily 90 tablet 2  . pregabalin (LYRICA) 75 MG capsule Take 75 mg by mouth at bedtime.    . traZODone (DESYREL) 100 MG tablet Take 2 tablets by mouth at  bedtime 180 tablet 1   No current facility-administered medications for this visit.    Allergies  Allergen Reactions  . Codeine Other  (See Comments)    PASS OUT  . Other     Poppy seeds.- EYES SWELL AND HIVES  . Penicillins Other (See Comments)    "PASS OUT, MESSES WITH MY HEART RATE"  . Tape     Pink tape-RASH AND ITCHING    Past Medical History  Diagnosis Date  . Diabetes mellitus   . Hypertension 05/07/2012    Normal 2D Echo; Renal Arterial Doppler 02/02/2007 - less than 60% diameter reduction, left proximal renal artery  . Asthma   . COPD (chronic obstructive pulmonary disease)   . Multiple sclerosis   . Goiter   . Hypertensive hypertrophic cardiomyopathy, by echo 05/07/12 05/08/2012  . Dyspnea 12/12/2005    CHF and Unspecified Chest pain - Myocardial Perfusion - post stress EF-72, negative for ischemia  . Hx of cardiac catheterization     normal cardiac cath  . Chronic diastolic HF (heart failure) 10/17/2012  . H/O cardiac catheterization 10/17/2012  . Hypothyroidism   . Depression   . Dyslipidemia   . Chronic low back pain   . Peripheral edema   . Neurogenic bladder   . Obesity   . Gait disorder   . GERD (gastroesophageal reflux disease)   . Arthritis   . Complication of anesthesia     HAS ASTHMA FLARES AFTER MOST SURGERIES  . Pneumonia DEC 2013    Blood pressure 158/98, pulse 88, height $RemoveBe'5\' 1"'ViPfKPIXA$  (1.549 m), weight 237 lb 1.6 oz (107.548 kg).   ASSESSMENT AND PLAN:  Today her BP contiues to remain elevated at 158/98.  I am going to increase her clonidine to 0.2 mg tiwce daily.  She continues to state full compliance with all her medications.  I did praise her continued weight loss, now at 18 pounds, and encouraged her to continue with the walking, explaining that exercise and weight loss can have positive effects for her.  I will see her again in 1 month.  At that time we may need to add in chlorthalidone to further drop her pressure.   [name] PharmD CPP [facility]

## 2014-12-30 ENCOUNTER — Other Ambulatory Visit: Payer: Self-pay | Admitting: Cardiovascular Disease

## 2015-01-22 ENCOUNTER — Encounter: Payer: Self-pay | Admitting: Pharmacist Clinician (PhC)/ Clinical Pharmacy Specialist

## 2015-01-22 ENCOUNTER — Ambulatory Visit (INDEPENDENT_AMBULATORY_CARE_PROVIDER_SITE_OTHER): Payer: Commercial Managed Care - HMO | Admitting: Pharmacist Clinician (PhC)/ Clinical Pharmacy Specialist

## 2015-01-22 VITALS — BP 180/120 | HR 88 | Ht 61.0 in | Wt 244.4 lb

## 2015-01-22 DIAGNOSIS — I10 Essential (primary) hypertension: Secondary | ICD-10-CM | POA: Diagnosis not present

## 2015-01-22 NOTE — Patient Instructions (Signed)
Return for a a follow up appointment in 6 weeks (Sept 29 at 2:30)  Your blood pressure today is 180/120  Check your blood pressure at home daily and keep record of the readings.  Take your BP meds as follows: increase clonidine 0.1 mg to 2 tablets twice daily  Bring your record of home blood pressures to your next appointment.  Exercise as you're able, try to walk approximately 30 minutes per day.  Keep salt intake to a minimum, especially watch canned and prepared boxed foods.  Eat more fresh fruits and vegetables and fewer canned items.  Avoid eating in fast food restaurants.    HOW TO TAKE YOUR BLOOD PRESSURE: . Rest 5 minutes before taking your blood pressure. .  Don't smoke or drink caffeinated beverages for at least 30 minutes before. . Take your blood pressure before (not after) you eat. . Sit comfortably with your back supported and both feet on the floor (don't cross your legs). . Elevate your arm to heart level on a table or a desk. . Use the proper sized cuff. It should fit smoothly and snugly around your bare upper arm. There should be enough room to slip a fingertip under the cuff. The bottom edge of the cuff should be 1 inch above the crease of the elbow. . Ideally, take 3 measurements at one sitting and record the average.

## 2015-01-22 NOTE — Progress Notes (Signed)
01/22/2015 [name] Fry March 21, 1951 [id number]   HPI:  [name] Fry is a 64 y.o. female patient of Dr Gwenlyn Found, with a PMH below who presents today for return hypertension clinic visit.  Her history is significant for hypertension, hyperlipidemia and diabetes.  Repeated cardiac catheterizations in 2008 and in 2013 showed normal coronary arteries.  She was treated in December 2013 at [facility] for hypertensive emergency and pneumonia.   She has very labile blood pressures and we've had her on maximum therapy for some time.  She continues with the edarby 80 mg daily, amlodipine 10 mg daily, hydralazine 100 mg tid, clonidine 0.1 mg bid and metoprolol 100 mg bid.    She states compliance with all medications.    She quit smoking in 2005 and does not drink any alcohol or caffeine.  She has been aware of the need for salt restriction for some time and believes she is doing well with this, using [name] and avoiding high sodium foods.  Over the past couple of months she has started walking more frequently.  She will walk for 20-30 minutes in a store or up and down her street.  At her last couple of visits her weight was down, 18 pounds total, but today she comes in up 7 pounds.  Her diabetes is not well controlled, although no A1c is available, her glucose tends to run in the 200s quite regularly.    She is unaware of any family history of cardiac disease, as she was adopted.  She has 3 children, all in their 14s, and none have cardiac concerns at this point.    Her home blood pressure readings are still mostly elevated.  In the past month 10 of 32 readings were <016 systolic, and only 3 of those <140.  Diastolic readings also are high, with only 2 <90.    Current Outpatient Prescriptions  Medication Sig Dispense Refill  . amLODipine (NORVASC) 10 MG tablet Take 1 tablet by mouth  daily 90 tablet 1  . aspirin EC 81 MG tablet Take 81 mg by mouth every morning.    Marland Kitchen atorvastatin (LIPITOR) 20 MG tablet  Take 1 tablet by mouth at  bedtime 90 tablet 1  . AVONEX PREFILLED 30 MCG/0.5ML PSKT injection Inject intramuscularly  66mcg every week. 1 kit 3  . cloNIDine (CATAPRES) 0.2 MG tablet Take 1 tablet (0.2 mg total) by mouth 2 (two) times daily. 180 tablet 1  . EDARBI 80 MG TABS Take 1 tablet by mouth  daily 90 tablet 1  . ergocalciferol (VITAMIN D2) 50000 UNITS capsule Take 50,000 Units by mouth 2 (two) times a week.    . fluticasone (FLONASE) 50 MCG/ACT nasal spray Place 2 sprays into the nose daily. 1 g 0  . fluticasone (FLOVENT HFA) 220 MCG/ACT inhaler Inhale 2 puffs into the lungs 2 (two) times daily.    . hydrALAZINE (APRESOLINE) 100 MG tablet Take 1 tablet (100 mg total) by mouth 3 (three) times daily. 270 tablet 1  . hydrALAZINE (APRESOLINE) 100 MG tablet Take 1 tablet by mouth 3  times daily 270 tablet 1  . insulin lispro protamine-lispro (HUMALOG 50/50 MIX) (50-50) 100 UNIT/ML SUSP injection Inject 30 Units into the skin every evening.    . insulin lispro protamine-lispro (HUMALOG 75/25 MIX) (75-25) 100 UNIT/ML SUSP injection Inject 60 Units into the skin every morning.    . interferon beta-1a (AVONEX) 30 MCG/0.5ML injection Inject 30 mcg into the muscle every Friday. TAKES  QFRIDAY AT 800 PM    . liothyronine (CYTOMEL) 25 MCG tablet Take 50 mcg by mouth 2 (two) times daily.    . metFORMIN (GLUCOPHAGE-XR) 750 MG 24 hr tablet Take 750 mg by mouth 2 (two) times daily.    . metoprolol (LOPRESSOR) 100 MG tablet Take 1 tablet by mouth two  times daily 180 tablet 1  . naproxen sodium (ANAPROX) 220 MG tablet Take 440 mg by mouth 2 (two) times daily as needed (pain).    . pantoprazole (PROTONIX) 40 MG tablet Take 1 tablet by mouth  daily 90 tablet 1  . pregabalin (LYRICA) 75 MG capsule Take 75 mg by mouth at bedtime.    . traZODone (DESYREL) 100 MG tablet Take 2 tablets by mouth at  bedtime 180 tablet 1   No current facility-administered medications for this visit.    Allergies  Allergen  Reactions  . Codeine Other (See Comments)    PASS OUT  . Other     Poppy seeds.- EYES SWELL AND HIVES  . Penicillins Other (See Comments)    "PASS OUT, MESSES WITH MY HEART RATE"  . Tape     Pink tape-RASH AND ITCHING    Past Medical History  Diagnosis Date  . Diabetes mellitus   . Hypertension 05/07/2012    Normal 2D Echo; Renal Arterial Doppler 02/02/2007 - less than 60% diameter reduction, left proximal renal artery  . Asthma   . COPD (chronic obstructive pulmonary disease)   . Multiple sclerosis   . Goiter   . Hypertensive hypertrophic cardiomyopathy, by echo 05/07/12 05/08/2012  . Dyspnea 12/12/2005    CHF and Unspecified Chest pain - Myocardial Perfusion - post stress EF-72, negative for ischemia  . Hx of cardiac catheterization     normal cardiac cath  . Chronic diastolic HF (heart failure) 10/17/2012  . H/O cardiac catheterization 10/17/2012  . Hypothyroidism   . Depression   . Dyslipidemia   . Chronic low back pain   . Peripheral edema   . Neurogenic bladder   . Obesity   . Gait disorder   . GERD (gastroesophageal reflux disease)   . Arthritis   . Complication of anesthesia     HAS ASTHMA FLARES AFTER MOST SURGERIES  . Pneumonia DEC 2013    Blood pressure 180/120, pulse 88, height $RemoveBe'5\' 1"'JiyMaCqqf$  (1.549 m), weight 244 lb 6.4 oz (110.859 kg).   ASSESSMENT AND PLAN:  At her last visit I noted that we were increasing her clonidine to 0.2 mg bid.  This made very little impact on her blood pressure, assuming that she did increase to the 0.2 mg tablets.  I have again asked her to take 2 tablets of her clonidine twice daily (she should be at 0.4 mg bid now), and I will see her in another 6 weeks.  She will continue with all her other medications and was encouraged to keep up with her walking and weight loss attempts.     [name] PharmD CPP [facility]

## 2015-01-31 ENCOUNTER — Other Ambulatory Visit: Payer: Self-pay | Admitting: Cardiovascular Disease

## 2015-02-02 NOTE — Telephone Encounter (Signed)
REFILL 

## 2015-03-05 ENCOUNTER — Ambulatory Visit (INDEPENDENT_AMBULATORY_CARE_PROVIDER_SITE_OTHER): Payer: Commercial Managed Care - HMO | Admitting: Pharmacist Clinician (PhC)/ Clinical Pharmacy Specialist

## 2015-03-05 ENCOUNTER — Encounter: Payer: Self-pay | Admitting: Pharmacist Clinician (PhC)/ Clinical Pharmacy Specialist

## 2015-03-05 VITALS — BP 170/110 | HR 84 | Ht 61.0 in | Wt 238.8 lb

## 2015-03-05 DIAGNOSIS — I10 Essential (primary) hypertension: Secondary | ICD-10-CM

## 2015-03-05 NOTE — Progress Notes (Signed)
03/05/2015 SHALVA ROZYCKI 05/25/1951 [id number]   HPI:  [name] Fry is a 64 y.o. female patient of Dr Gwenlyn Found, with a PMH below who presents today for return hypertension clinic visit.  Her history is significant for hypertension, hyperlipidemia and diabetes.  Repeated cardiac catheterizations in 2008 and in 2013 showed normal coronary arteries.  She was treated in December 2013 at [facility] for hypertensive emergency and pneumonia.   She has very labile blood pressures and we've had her on maximum therapy for some time.  She continues with the edarby 80 mg daily, amlodipine 10 mg daily, hydralazine 100 mg bid, clonidine 0.1 mg bid and metoprolol 100 mg bid.  Of note she brings in her medications today and she has also been taking losartan 100 mg once daily.  She states compliance with all medications.    She quit smoking in 2005 and does not drink any alcohol or caffeine.  She has been aware of the need for salt restriction for some time and believes she is doing well with this, using [name] and avoiding high sodium foods.  Over the past couple of months she has started walking more frequently.  She will walk for 20-30 minutes in a store or up and down her street.  Overall she has dropped 18 pounds in the past year and she continues to work at decreasing her weight.  Her diabetes is not well controlled, although no A1c is available, her glucose tends to run in the 200s quite regularly.    She is unaware of any family history of cardiac disease, as she was adopted.  She has 3 children, all in their 62s, and none have cardiac concerns at this point.    Her home blood pressure readings are improved with only 4 readings > or = 150 and over half < or = to 140.   Diastolic readings are still running high but better with 14/28 <90.  Interestingly, other than 1 reading at 852 systolic all were lower, with the exception of yesterday, which was 211/132.  She can't explain any differences in yesterday to  account for the large jump.  But her pressure in the office today is closer to that reading.  Current Outpatient Prescriptions  Medication Sig Dispense Refill  . amLODipine (NORVASC) 10 MG tablet Take 1 tablet by mouth  daily 90 tablet 1  . aspirin EC 81 MG tablet Take 81 mg by mouth every morning.    Marland Kitchen atorvastatin (LIPITOR) 20 MG tablet Take 1 tablet by mouth at  bedtime 90 tablet 1  . AVONEX PREFILLED 30 MCG/0.5ML PSKT injection Inject intramuscularly  58mcg every week. 1 kit 3  . cloNIDine (CATAPRES) 0.2 MG tablet Take 1 tablet (0.2 mg total) by mouth 2 (two) times daily. 180 tablet 1  . EDARBI 80 MG TABS Take 1 tablet by mouth  daily 90 tablet 1  . ergocalciferol (VITAMIN D2) 50000 UNITS capsule Take 50,000 Units by mouth 2 (two) times a week.    . fluticasone (FLONASE) 50 MCG/ACT nasal spray Place 2 sprays into the nose daily. 1 g 0  . fluticasone (FLOVENT HFA) 220 MCG/ACT inhaler Inhale 2 puffs into the lungs 2 (two) times daily.    . furosemide (LASIX) 40 MG tablet Take 1 tablet by mouth  daily 90 tablet 0  . hydrALAZINE (APRESOLINE) 100 MG tablet Take 1 tablet (100 mg total) by mouth 3 (three) times daily. 270 tablet 1  . hydrALAZINE (APRESOLINE) 100  MG tablet Take 1 tablet by mouth 3  times daily 270 tablet 1  . insulin lispro protamine-lispro (HUMALOG 50/50 MIX) (50-50) 100 UNIT/ML SUSP injection Inject 30 Units into the skin every evening.    . insulin lispro protamine-lispro (HUMALOG 75/25 MIX) (75-25) 100 UNIT/ML SUSP injection Inject 60 Units into the skin every morning.    . interferon beta-1a (AVONEX) 30 MCG/0.5ML injection Inject 30 mcg into the muscle every Friday. TAKES QFRIDAY AT 800 PM    . liothyronine (CYTOMEL) 25 MCG tablet Take 50 mcg by mouth 2 (two) times daily.    . metFORMIN (GLUCOPHAGE-XR) 750 MG 24 hr tablet Take 750 mg by mouth 2 (two) times daily.    . metoprolol (LOPRESSOR) 100 MG tablet Take 1 tablet by mouth two  times daily 180 tablet 1  . naproxen sodium  (ANAPROX) 220 MG tablet Take 440 mg by mouth 2 (two) times daily as needed (pain).    . pantoprazole (PROTONIX) 40 MG tablet Take 1 tablet by mouth  daily 90 tablet 1  . pregabalin (LYRICA) 75 MG capsule Take 75 mg by mouth at bedtime.    . traZODone (DESYREL) 100 MG tablet Take 2 tablets by mouth at  bedtime 180 tablet 1   No current facility-administered medications for this visit.    Allergies  Allergen Reactions  . Codeine Other (See Comments)    PASS OUT  . Other     Poppy seeds.- EYES SWELL AND HIVES  . Penicillins Other (See Comments)    "PASS OUT, MESSES WITH MY HEART RATE"  . Tape     Pink tape-RASH AND ITCHING    Past Medical History  Diagnosis Date  . Diabetes mellitus   . Hypertension 05/07/2012    Normal 2D Echo; Renal Arterial Doppler 02/02/2007 - less than 60% diameter reduction, left proximal renal artery  . Asthma   . COPD (chronic obstructive pulmonary disease)   . Multiple sclerosis   . Goiter   . Hypertensive hypertrophic cardiomyopathy, by echo 05/07/12 05/08/2012  . Dyspnea 12/12/2005    CHF and Unspecified Chest pain - Myocardial Perfusion - post stress EF-72, negative for ischemia  . Hx of cardiac catheterization     normal cardiac cath  . Chronic diastolic HF (heart failure) 10/17/2012  . H/O cardiac catheterization 10/17/2012  . Hypothyroidism   . Depression   . Dyslipidemia   . Chronic low back pain   . Peripheral edema   . Neurogenic bladder   . Obesity   . Gait disorder   . GERD (gastroesophageal reflux disease)   . Arthritis   . Complication of anesthesia     HAS ASTHMA FLARES AFTER MOST SURGERIES  . Pneumonia DEC 2013    Blood pressure 170/110, pulse 84, height $RemoveBe'5\' 1"'DoVoTvvLn$  (1.549 m), weight 238 lb 12.8 oz (108.319 kg).   ASSESSMENT AND PLAN:  At her last visit I noted that we were increasing her clonidine to 0.2 mg bid.  This made very little impact on her blood pressure, assuming that she did increase to the 0.2 mg tablets.  I have again asked  her to take 2 tablets of her clonidine twice daily (she should be at 0.4 mg bid now), and I will see her in another 6 weeks.  She will continue with all her other medications and was encouraged to keep up with her walking and weight loss attempts.     [name] PharmD CPP [facility]

## 2015-03-05 NOTE — Assessment & Plan Note (Signed)
BP elevated above normal home readings, at 170/110.  Lower than her home reading yesterday, but still quite elevated from past values.  Patient cannot explain any differences that would account for the higher readings.  I have asked her to take an extra clonidine tablet today when she gets home, then continue with daily monitoring.  If she notices the elevated readings continue over the next few days she is to call and let me know.  Otherwise I will leave all her medications the same and she is due to see [name] in November.  She was asked to bring her BP cuff to that appointment so we can verify its accuracy once again.  I will continue to see her every 3 months, with our next visit in February.

## 2015-03-05 NOTE — Patient Instructions (Signed)
Return for a a follow up appointment with [name]  Your blood pressure today is 170/110  Check your blood pressure at home daily and keep record of the readings.  Take your BP meds as follows: stop losartan - it works same as Personnel officer.  Take extra clonidine today if home BP is elevated  Bring all of your meds, your BP cuff and your record of home blood pressures to your next appointment.  Exercise as you're able, try to walk approximately 30 minutes per day.  Keep salt intake to a minimum, especially watch canned and prepared boxed foods.  Eat more fresh fruits and vegetables and fewer canned items.  Avoid eating in fast food restaurants.    HOW TO TAKE YOUR BLOOD PRESSURE: . Rest 5 minutes before taking your blood pressure. .  Don't smoke or drink caffeinated beverages for at least 30 minutes before. . Take your blood pressure before (not after) you eat. . Sit comfortably with your back supported and both feet on the floor (don't cross your legs). . Elevate your arm to heart level on a table or a desk. . Use the proper sized cuff. It should fit smoothly and snugly around your bare upper arm. There should be enough room to slip a fingertip under the cuff. The bottom edge of the cuff should be 1 inch above the crease of the elbow. . Ideally, take 3 measurements at one sitting and record the average.

## 2015-04-18 ENCOUNTER — Other Ambulatory Visit: Payer: Self-pay | Admitting: Cardiovascular Disease

## 2015-04-18 ENCOUNTER — Other Ambulatory Visit: Payer: Self-pay | Admitting: Neurology

## 2015-04-22 ENCOUNTER — Ambulatory Visit: Payer: Commercial Managed Care - HMO | Admitting: Cardiovascular Disease

## 2015-04-28 ENCOUNTER — Encounter: Payer: Self-pay | Admitting: Cardiovascular Disease

## 2015-04-28 ENCOUNTER — Ambulatory Visit (INDEPENDENT_AMBULATORY_CARE_PROVIDER_SITE_OTHER): Payer: Commercial Managed Care - HMO | Admitting: Cardiovascular Disease

## 2015-04-28 VITALS — BP 136/86 | HR 96 | Ht 61.0 in | Wt 239.0 lb

## 2015-04-28 DIAGNOSIS — E785 Hyperlipidemia, unspecified: Secondary | ICD-10-CM

## 2015-04-28 DIAGNOSIS — I214 Non-ST elevation (NSTEMI) myocardial infarction: Secondary | ICD-10-CM

## 2015-04-28 DIAGNOSIS — I10 Essential (primary) hypertension: Secondary | ICD-10-CM

## 2015-04-28 NOTE — Assessment & Plan Note (Signed)
History of difficult to control hypertension with blood pressure measurements at 136/86. She is on clonidine 0.2 mg, EDARBI ,  Hydralazine.continue current meds at current dosing

## 2015-04-28 NOTE — Assessment & Plan Note (Signed)
History of multiple normal cardiac catheterizations most recently performed by Dr. Herbie Baltimore 05/07/12.

## 2015-04-28 NOTE — Progress Notes (Signed)
04/28/2015 [name] Fry   08/08/1950  [id number]  Primary Physician Foye Spurling, MD Primary Cardiologist: Lorretta Harp MD Renae Gloss   HPI:  The patient is a 64 year old mildly overweight married [place] American female, mother of 41, grandmother to 6 grandchildren, formerly a patient of Dr. Eddie Dibbles. I last saw her in the office 05/07/14. She has a history of normal coronary arteries by catheterization back in 2008 as well as most recently May 07, 2012, by Dr. Ellyn Hack. I saw her one year ago. She has severe hypertension, diabetes, and hyperlipidemia. She was admitted to [facility], December 1 through 10, with hypertensive urgency, was intubated, and also treated for pneumonia. Medicines were adjusted. She is aware of salt restriction. She has history of remote tobacco abuse, having stopped in 2005 .Since I saw her she remains completely stable and denies chest pain or shortness of breath. She has been seeing Kristen for antihypertensive medication just adjustment with excellent blood pressures recently.   Current Outpatient Prescriptions  Medication Sig Dispense Refill  . amLODipine (NORVASC) 10 MG tablet Take 1 tablet by mouth  daily 90 tablet 1  . aspirin EC 81 MG tablet Take 81 mg by mouth every morning.    Marland Kitchen atorvastatin (LIPITOR) 20 MG tablet Take 1 tablet by mouth at  bedtime 90 tablet 1  . AVONEX PREFILLED 30 MCG/0.5ML PSKT injection Inject intramuscularly  52mcg every week. 1 kit 3  . cloNIDine (CATAPRES) 0.2 MG tablet Take 1 tablet (0.2 mg total) by mouth 2 (two) times daily. 180 tablet 1  . EDARBI 80 MG TABS Take 1 tablet by mouth  daily 90 tablet 1  . ergocalciferol (VITAMIN D2) 50000 UNITS capsule Take 50,000 Units by mouth 2 (two) times a week.    . fluticasone (FLONASE) 50 MCG/ACT nasal spray Place 2 sprays into the nose daily. 1 g 0  . fluticasone (FLOVENT HFA) 220 MCG/ACT inhaler Inhale 2 puffs into the lungs 2 (two) times daily.    .  furosemide (LASIX) 40 MG tablet Take 1 tablet by mouth  daily 90 tablet 3  . hydrALAZINE (APRESOLINE) 100 MG tablet Take 1 tablet by mouth 3  times daily 270 tablet 3  . insulin lispro protamine-lispro (HUMALOG 75/25 MIX) (75-25) 100 UNIT/ML SUSP injection Inject 60 Units into the skin every morning.    . interferon beta-1a (AVONEX) 30 MCG/0.5ML injection Inject 30 mcg into the muscle every Friday. TAKES QFRIDAY AT 800 PM    . liothyronine (CYTOMEL) 25 MCG tablet Take 50 mcg by mouth 2 (two) times daily.    . metFORMIN (GLUCOPHAGE-XR) 750 MG 24 hr tablet Take 750 mg by mouth 2 (two) times daily.    . metoprolol (LOPRESSOR) 100 MG tablet Take 1 tablet by mouth two  times daily 180 tablet 1  . naproxen sodium (ANAPROX) 220 MG tablet Take 440 mg by mouth 2 (two) times daily as needed (pain).    . pantoprazole (PROTONIX) 40 MG tablet Take 1 tablet by mouth  daily 90 tablet 1  . pregabalin (LYRICA) 75 MG capsule Take 75 mg by mouth at bedtime.    . traZODone (DESYREL) 100 MG tablet Take 2 tablets by mouth at  bedtime 180 tablet 0   No current facility-administered medications for this visit.    Allergies  Allergen Reactions  . Codeine Other (See Comments)    PASS OUT  . Other     Poppy seeds.- EYES SWELL AND HIVES  . Penicillins  Other (See Comments)    "PASS OUT, MESSES WITH MY HEART RATE"  . Tape     Pink tape-RASH AND ITCHING    Social History   Social History  . Marital Status: Married    Spouse Name: Charlotte Crumb  . Number of Children: 3  . Years of Education: 14   Occupational History  . Not on file.   Social History Main Topics  . Smoking status: Former Smoker -- 1.00 packs/day for 20 years    Types: Cigarettes    Quit date: 10/24/2003  . Smokeless tobacco: Never Used  . Alcohol Use: No  . Drug Use: No  . Sexual Activity: Not on file   Other Topics Concern  . Not on file   Social History Narrative   Patient is married Biomedical engineer) and lives with her husband and her  sister-in-law.   Patient has three children.   Patient is disabled.   Patient has a college education.   Patient is right-handed.   Patient does not drink any caffeine.     Review of Systems: General: negative for chills, fever, night sweats or weight changes.  Cardiovascular: negative for chest pain, dyspnea on exertion, edema, orthopnea, palpitations, paroxysmal nocturnal dyspnea or shortness of breath Dermatological: negative for rash Respiratory: negative for cough or wheezing Urologic: negative for hematuria Abdominal: negative for nausea, vomiting, diarrhea, bright red blood per rectum, melena, or hematemesis Neurologic: negative for visual changes, syncope, or dizziness All other systems reviewed and are otherwise negative except as noted above.    Blood pressure 136/86, pulse 96, height $RemoveBe'5\' 1"'[id number]$  (1.549 m), weight 239 lb (108.41 kg).  General appearance: alert and no distress Neck: no adenopathy, no carotid bruit, no JVD, supple, symmetrical, trachea midline and thyroid not enlarged, symmetric, no tenderness/mass/nodules Lungs: clear to auscultation bilaterally Heart: regular rate and rhythm, S1, S2 normal, no murmur, click, rub or gallop Extremities: extremities normal, atraumatic, no cyanosis or edema  EKG normal/96 with septal Q waves and nonspecific ST and T-wave changes. I personally reviewed his EKG  ASSESSMENT AND PLAN:   Uncontrolled hypertension History of difficult to control hypertension with blood pressure measurements at 136/86. She is on clonidine 0.2 mg, EDARBI ,  Hydralazine.continue current meds at current dosing  NSTEMI, Type 2- Troponin 1.4 History of multiple normal cardiac catheterizations most recently performed by Dr. Ellyn Hack 05/07/12.  Hyperlipidemia History of hyperlipidemia on atorvastatin followed by her PCP      Lorretta Harp MD [facility], [facility] 04/28/2015 3:25 PM

## 2015-04-28 NOTE — Patient Instructions (Signed)

## 2015-04-28 NOTE — Assessment & Plan Note (Signed)
History of hyperlipidemia on atorvastatin followed by her PCP 

## 2015-05-01 ENCOUNTER — Other Ambulatory Visit: Payer: Self-pay | Admitting: Cardiovascular Disease

## 2015-05-04 NOTE — Telephone Encounter (Signed)
Rx(s) sent to pharmacy electronically.  

## 2015-06-14 ENCOUNTER — Other Ambulatory Visit: Payer: Self-pay | Admitting: Cardiovascular Disease

## 2015-06-15 NOTE — Telephone Encounter (Signed)
Rx request sent to pharmacy.  

## 2015-06-24 ENCOUNTER — Other Ambulatory Visit: Payer: Self-pay | Admitting: Cardiovascular Disease

## 2015-06-24 NOTE — Telephone Encounter (Signed)
Rx request sent to pharmacy.  

## 2015-06-29 ENCOUNTER — Ambulatory Visit (INDEPENDENT_AMBULATORY_CARE_PROVIDER_SITE_OTHER): Payer: Commercial Managed Care - HMO | Admitting: Adult Health

## 2015-06-29 ENCOUNTER — Encounter: Payer: Self-pay | Admitting: Adult Health

## 2015-06-29 VITALS — BP 179/92 | HR 90 | Ht 61.0 in | Wt 228.0 lb

## 2015-06-29 DIAGNOSIS — R269 Unspecified abnormalities of gait and mobility: Secondary | ICD-10-CM | POA: Diagnosis not present

## 2015-06-29 DIAGNOSIS — G35 Multiple sclerosis: Secondary | ICD-10-CM | POA: Diagnosis not present

## 2015-06-29 DIAGNOSIS — Z5181 Encounter for therapeutic drug level monitoring: Secondary | ICD-10-CM

## 2015-06-29 NOTE — Progress Notes (Addendum)
PATIENT: [name] Fry DOB: [birth year]  REASON FOR VISIT: follow up- multiple sclerosis, abnormality of gait HISTORY FROM: patient  HISTORY OF PRESENT ILLNESS: [name] Fry is a 65 year old female with a history of multiple sclerosis and abnormality of gait. She returns today for follow-up. The patient reports that she feels that her weakness and balance is getting worse. She states that this is been ongoing for one year. She has completed physical therapy several times. She uses a [facility] [employer] practitioner. She states that she has the most problems with steps or uneven surfaces. She states that she's had at least 4 falls in the last year. The patient states that she has numbness in the bottom of her feet and toes and in her fingertips. She states this is been going on since the summer. She denies any troubles with the bowels or bladder. Denies any trouble with her vision. The patient continues on Avonex and tolerates it well. The patient had an MRI 1 year ago that did not show any new lesions. She returns today for an evaluation.  HISTORY 06/12/14: Ms. [name] is a 65 year old female with a history of multiple sclerosis and abnormality of gait. She returns today for follow-up. the patient is currently taking Avonex and tolerating it well. Patient denies any new numbness or weakness. She states that she has been having cold feet and cold legs that started in the fall this year. She had dopplers of her legs but have not gotten those results back. No changes with her gait or balance. She states that she is slower walking in the winter time. She does use a walker to ambulate. Patient had a fall in December but it was due to her walker. Her [employer] nurse told she needed to get bigger wheels on her walker. Denies any new issues with the bowels or bladder. No changes in her fatigue level. She states that she had blood work with Dr. Carlis Abbott- she states that her hgb was low and her platelets were zero? She was  referred to a hematologist but has not been yet. She states that she bled for 21 days in November for her menstrual cycle. Patient continues to use trazodone to help her sleep at night. She states overall this is working well for her.  REVIEW OF SYSTEMS: Out of a complete 14 system review of symptoms, the patient complains only of the following symptoms, and all other reviewed systems are negative.  Numbness, weakness, tremors, aching muscles, walking difficulty, itching, insomnia, abdominal pain, cold intolerance, heat intolerance, chest pain, leg swelling  ALLERGIES: Allergies  Allergen Reactions  . Codeine Other (See Comments)    PASS OUT  . Other     Poppy seeds.- EYES SWELL AND HIVES  . Penicillins Other (See Comments)    "PASS OUT, MESSES WITH MY HEART RATE"  . Tape     Pink tape-RASH AND ITCHING    HOME MEDICATIONS: Outpatient Prescriptions Prior to Visit  Medication Sig Dispense Refill  . amLODipine (NORVASC) 10 MG tablet Take 1 tablet by mouth  daily 90 tablet 2  . aspirin EC 81 MG tablet Take 81 mg by mouth every morning.    Marland Kitchen atorvastatin (LIPITOR) 20 MG tablet Take 1 tablet by mouth at  bedtime 90 tablet 2  . AVONEX PREFILLED 30 MCG/0.5ML PSKT injection Inject intramuscularly  35mg every week. 1 kit 3  . cloNIDine (CATAPRES) 0.2 MG tablet Take 1 tablet by mouth two  times daily 180 tablet 3  .  EDARBI 80 MG TABS Take 1 tablet by mouth  daily 90 tablet 2  . ergocalciferol (VITAMIN D2) 50000 UNITS capsule Take 50,000 Units by mouth 2 (two) times a week.    . fluticasone (FLONASE) 50 MCG/ACT nasal Fry Place 2 sprays into the nose daily. 1 g 0  . fluticasone (FLOVENT HFA) 220 MCG/ACT inhaler Inhale 2 puffs into the lungs 2 (two) times daily.    . furosemide (LASIX) 40 MG tablet Take 1 tablet by mouth  daily 90 tablet 3  . hydrALAZINE (APRESOLINE) 100 MG tablet Take 1 tablet by mouth 3  times daily 270 tablet 3  . insulin lispro protamine-lispro (HUMALOG 75/25 MIX) (75-25)  100 UNIT/ML SUSP injection Inject 60 Units into the skin every morning.    . interferon beta-1a (AVONEX) 30 MCG/0.5ML injection Inject 30 mcg into the muscle every Friday. TAKES QFRIDAY AT 800 PM    . liothyronine (CYTOMEL) 25 MCG tablet Take 50 mcg by mouth 2 (two) times daily.    . metFORMIN (GLUCOPHAGE-XR) 750 MG 24 hr tablet Take 750 mg by mouth 2 (two) times daily.    . metoprolol (LOPRESSOR) 100 MG tablet Take 1 tablet by mouth two  times daily 180 tablet 2  . naproxen sodium (ANAPROX) 220 MG tablet Take 440 mg by mouth 2 (two) times daily as needed (pain).    . pantoprazole (PROTONIX) 40 MG tablet Take 1 tablet by mouth  daily 90 tablet 2  . pregabalin (LYRICA) 75 MG capsule Take 75 mg by mouth at bedtime.    . traZODone (DESYREL) 100 MG tablet Take 2 tablets by mouth at  bedtime 180 tablet 0   No facility-administered medications prior to visit.    PAST MEDICAL HISTORY: Past Medical History  Diagnosis Date  . Diabetes mellitus   . Hypertension 05/07/2012    Normal 2D Echo; Renal Arterial Doppler 02/02/2007 - less than 60% diameter reduction, left proximal renal artery  . Asthma   . COPD (chronic obstructive pulmonary disease) ([place])   . Multiple sclerosis ([place])   . Goiter   . Hypertensive hypertrophic cardiomyopathy, by echo 05/07/12 05/08/2012  . Dyspnea 12/12/2005    CHF and Unspecified Chest pain - Myocardial Perfusion - post stress EF-72, negative for ischemia  . Hx of cardiac catheterization     normal cardiac cath  . Chronic diastolic HF (heart failure) ([place]) 10/17/2012  . H/O cardiac catheterization 10/17/2012  . Hypothyroidism   . Depression   . Dyslipidemia   . Chronic low back pain   . Peripheral edema   . Neurogenic bladder   . Obesity   . Gait disorder   . GERD (gastroesophageal reflux disease)   . Arthritis   . Complication of anesthesia     HAS ASTHMA FLARES AFTER MOST SURGERIES  . Pneumonia DEC 2013    PAST SURGICAL HISTORY: Past Surgical History    Procedure Laterality Date  . Cardiac catheterization Left 05/06/2012    Moderate caliber vessel from mid LAD  . Cardiac catheterization Right 04/02/2007    Left ventricular hypertrophy, no coronary disease, no renal artery stenosis  . Cesarean section       3x -[id number] AND 1985  . Cholecystectomy      1973  . Breast surgery      2 tumors removed -right breast  . Appendectomy      1973  . Arthoscopic rotaor cuff repair Right 1995  . Joint replacement       right shoulder  1985   . Thyroidectomy N/A 11/01/2013    Procedure: THYROIDECTOMY;  Surgeon: Odis Hollingshead, MD;  Location: [facility] ORS;  Service: General;  Laterality: N/A;  . Left heart catheterization with coronary angiogram N/A 05/07/2012    Procedure: LEFT HEART CATHETERIZATION WITH CORONARY ANGIOGRAM;  Surgeon: Leonie Man, MD;  Location: [facility] CATH LAB;  Service: Cardiovascular;  Laterality: N/A;    FAMILY HISTORY: Family History  Problem Relation Age of Onset  . Adopted: Yes  . Coronary artery disease Father 51    MI  . Diabetes Father   . Heart disease Mother   . Diabetes Mother     SOCIAL HISTORY: Social History   Social History  . Marital Status: Married    Spouse Name: Charlotte Crumb  . Number of Children: 3  . Years of Education: 14   Occupational History  . Not on file.   Social History Main Topics  . Smoking status: Former Smoker -- 1.00 packs/day for 20 years    Types: Cigarettes    Quit date: 10/24/2003  . Smokeless tobacco: Never Used  . Alcohol Use: No  . Drug Use: No  . Sexual Activity: Not on file   Other Topics Concern  . Not on file   Social History Narrative   Patient is married Biomedical engineer) and lives with her husband and her sister-in-law.   Patient has three children.   Patient is disabled.   Patient has a college education.   Patient is right-handed.   Patient does not drink any caffeine.      PHYSICAL EXAM  Filed Vitals:   06/29/15 1441  BP: 179/92  Pulse: 90  Height: '5\' 1"'$   (1.549 m)  Weight: 228 lb (103.42 kg)   Body mass index is 43.1 kg/(m^2).  Generalized: Well developed, in no acute distress   Neurological examination  Mentation: Alert oriented to time, place, history taking. Follows all commands speech and language fluent Cranial nerve II-XII: Pupils were equal round reactive to light. Extraocular movements were full, right with divergent gaze to the right. visual field were full on confrontational test. Facial sensation and strength were normal. Uvula tongue midline. Head turning and shoulder shrug  were normal and symmetric. Motor: The motor testing reveals 5 over 5 strength of all 4 extremities. Good symmetric motor tone is noted throughout.  Sensory: Sensory testing is intact to soft touch on all 4 extremities. No evidence of extinction is noted.  Coordination: Cerebellar testing reveals good finger-nose-finger and heel-to-shin bilaterally.  Gait and station: Gait is slightly unsteady. Patient is able to stand without assistance. Tandem gait not attempted. She uses a Rollator when ambulating. Reflexes: Deep tendon reflexes are symmetric but depressed throughout   DIAGNOSTIC DATA (LABS, IMAGING, TESTING) - I reviewed patient records, labs, notes, testing and imaging myself where available.  Lab Results  Component Value Date   WBC 10.5 06/12/2014   HGB 15.0 06/12/2014   HCT 45.3 06/12/2014   MCV 97 06/12/2014   PLT 96* 06/12/2014      Component Value Date/Time   NA 138 06/12/2014 1529   NA 133* 11/03/2013 0825   K 4.2 06/12/2014 1529   CL 94* 06/12/2014 1529   CO2 26 06/12/2014 1529   GLUCOSE 327* 06/12/2014 1529   GLUCOSE 258* 11/03/2013 0825   BUN 11 06/12/2014 1529   BUN 23 11/03/2013 0825   CREATININE 1.15* 06/12/2014 1529   CREATININE 0.94 10/17/2012 1502   CALCIUM 10.2 06/12/2014 1529   PROT 7.9 06/12/2014  1529   PROT 8.1 10/23/2013 1412   ALBUMIN 4.6 06/12/2014 1529   ALBUMIN 3.4* 10/23/2013 1412   AST 41* 06/12/2014 1529    ALT 31 06/12/2014 1529   ALKPHOS 82 06/12/2014 1529   BILITOT 0.9 06/12/2014 1529   GFRNONAA 51* 06/12/2014 1529   GFRAA 33* 06/12/2014 1529        ASSESSMENT AND PLAN 65 y.o. year old female  has a past medical history of Diabetes mellitus; Hypertension (05/07/2012); Asthma; COPD (chronic obstructive pulmonary disease) ([place]); Multiple sclerosis ([place]); Goiter; Hypertensive hypertrophic cardiomyopathy, by echo 05/07/12 (05/08/2012); Dyspnea (12/12/2005); cardiac catheterization; Chronic diastolic HF (heart failure) ([place]) (10/17/2012); H/O cardiac catheterization (10/17/2012); Hypothyroidism; Depression; Dyslipidemia; Chronic low back pain; Peripheral edema; Neurogenic bladder; Obesity; Gait disorder; GERD (gastroesophageal reflux disease); Arthritis; Complication of anesthesia; and Pneumonia ([place] 2013). here with:  1. Multiple sclerosis 2. Abnormality of gait  The patient feels that her weakness and balance has progressively gotten worse over the last year. I will refer the patient for another round of physical therapy. I will also recheck an MRI of the brain. We will check blood work today. She will continue on Avonex. Patient will follow-up in 4-5 months with Dr. Jannifer Franklin.     Ward Givens, MSN, NP-C 06/29/2015, 2:43 PM [facility] [address] [place], [place] [address] [phone]

## 2015-06-29 NOTE — Patient Instructions (Signed)
Continue Avonex Blood work today Pending lab work - MRI brain If your symptoms worsen or you develop new symptoms please let us know.

## 2015-06-30 ENCOUNTER — Telehealth: Payer: Self-pay | Admitting: Adult Health

## 2015-06-30 LAB — CBC WITH DIFFERENTIAL/PLATELET
BASOS ABS: 0 10*3/uL (ref 0.0–0.2)
Basos: 0 %
EOS (ABSOLUTE): 0.2 10*3/uL (ref 0.0–0.4)
EOS: 2 %
HEMOGLOBIN: 14.7 g/dL (ref 11.1–15.9)
Hematocrit: 43.6 % (ref 34.0–46.6)
IMMATURE GRANULOCYTES: 1 %
Immature Grans (Abs): 0 10*3/uL (ref 0.0–0.1)
Lymphocytes Absolute: 2.8 10*3/uL (ref 0.7–3.1)
Lymphs: 33 %
MCH: 32.7 pg (ref 26.6–33.0)
MCHC: 33.7 g/dL (ref 31.5–35.7)
MCV: 97 fL (ref 79–97)
MONOCYTES: 5 %
Monocytes Absolute: 0.4 10*3/uL (ref 0.1–0.9)
NEUTROS PCT: 59 %
Neutrophils Absolute: 5 10*3/uL (ref 1.4–7.0)
PLATELETS: 112 10*3/uL — AB (ref 150–379)
RBC: 4.5 x10E6/uL (ref 3.77–5.28)
RDW: 14.9 % (ref 12.3–15.4)
WBC: 8.5 10*3/uL (ref 3.4–10.8)

## 2015-06-30 LAB — COMPREHENSIVE METABOLIC PANEL
ALK PHOS: 67 IU/L (ref 39–117)
ALT: 19 IU/L (ref 0–32)
AST: 21 IU/L (ref 0–40)
Albumin/Globulin Ratio: 1.3 (ref 1.1–2.5)
Albumin: 4.3 g/dL (ref 3.6–4.8)
BUN/Creatinine Ratio: 11 (ref 11–26)
BUN: 12 mg/dL (ref 8–27)
Bilirubin Total: 1.3 mg/dL — ABNORMAL HIGH (ref 0.0–1.2)
CHLORIDE: 97 mmol/L (ref 96–106)
CO2: 28 mmol/L (ref 18–29)
CREATININE: 1.14 mg/dL — AB (ref 0.57–1.00)
Calcium: 9.4 mg/dL (ref 8.7–10.3)
GFR calc Af Amer: 59 mL/min/{1.73_m2} — ABNORMAL LOW (ref >59)
GFR calc non Af Amer: 51 mL/min/{1.73_m2} — ABNORMAL LOW (ref >59)
GLUCOSE: 259 mg/dL — AB (ref 65–99)
Globulin, Total: 3.2 g/dL (ref 1.5–4.5)
Potassium: 3.8 mmol/L (ref 3.5–5.2)
Sodium: 143 mmol/L (ref 134–144)
Total Protein: 7.5 g/dL (ref 6.0–8.5)

## 2015-06-30 NOTE — Addendum Note (Signed)
Addended by: Enedina Finner on: 06/30/2015 04:53 PM   Modules accepted: Orders

## 2015-06-30 NOTE — Telephone Encounter (Signed)
I called the patient. Her creatinine was elevated and GFR was slightly low. We will do the MRI of the brain without contrast. The patient's glucose was elevated. I will for her blood work to her primary care provider.

## 2015-07-03 ENCOUNTER — Telehealth: Payer: Self-pay | Admitting: Cardiovascular Disease

## 2015-07-03 ENCOUNTER — Ambulatory Visit: Payer: Commercial Managed Care - HMO | Attending: Adult Health

## 2015-07-03 DIAGNOSIS — R2681 Unsteadiness on feet: Secondary | ICD-10-CM | POA: Diagnosis present

## 2015-07-03 DIAGNOSIS — R269 Unspecified abnormalities of gait and mobility: Secondary | ICD-10-CM | POA: Insufficient documentation

## 2015-07-03 DIAGNOSIS — R29898 Other symptoms and signs involving the musculoskeletal system: Secondary | ICD-10-CM | POA: Insufficient documentation

## 2015-07-03 NOTE — Telephone Encounter (Signed)
Called patient at home. Not available according to person answering. Will attempt again Monday.

## 2015-07-03 NOTE — Therapy (Signed)
[facility] [address] [place], [place], [address] Phone: [phone]   Fax:  [phone]  Physical Therapy Evaluation  Patient Details  Name: [name] Fry MRN: [id number] Date of Birth: [birth year] Referring Provider: Butch Penny, NP  Encounter Date: 07/03/2015      PT End of Session - 07/03/15 1549    Visit Number 1   Number of Visits 17   Date for PT Re-Evaluation 09/01/15   Authorization Type U[employer]   PT Start Time 1447   PT Stop Time 1536   PT Time Calculation (min) 49 min   Equipment Utilized During Treatment Gait belt   Activity Tolerance Treatment limited secondary to medical complications (Comment)  increased BP   Behavior During Therapy [facility] for tasks assessed/performed      Past Medical History  Diagnosis Date  . Diabetes mellitus   . Hypertension 05/07/2012    Normal 2D Echo; Renal Arterial Doppler 02/02/2007 - less than 60% diameter reduction, left proximal renal artery  . Asthma   . COPD (chronic obstructive pulmonary disease) (HCC)   . Multiple sclerosis (HCC)   . Goiter   . Hypertensive hypertrophic cardiomyopathy, by echo 05/07/12 05/08/2012  . Dyspnea 12/12/2005    CHF and Unspecified Chest pain - Myocardial Perfusion - post stress EF-72, negative for ischemia  . Hx of cardiac catheterization     normal cardiac cath  . Chronic diastolic HF (heart failure) (HCC) 10/17/2012  . H/O cardiac catheterization 10/17/2012  . Hypothyroidism   . Depression   . Dyslipidemia   . Chronic low back pain   . Peripheral edema   . Neurogenic bladder   . Obesity   . Gait disorder   . GERD (gastroesophageal reflux disease)   . Arthritis   . Complication of anesthesia     HAS ASTHMA FLARES AFTER MOST SURGERIES  . Pneumonia DEC 2013    Past Surgical History  Procedure Laterality Date  . Cardiac catheterization Left 05/06/2012    Moderate caliber vessel from mid LAD  . Cardiac catheterization Right  04/02/2007    Left ventricular hypertrophy, no coronary disease, no renal artery stenosis  . Cesarean section       3x -[id number] AND 1985  . Cholecystectomy      1973  . Breast surgery      2 tumors removed -right breast  . Appendectomy      1973  . Arthoscopic rotaor cuff repair Right 1995  . Joint replacement       right shoulder 1985   . Thyroidectomy N/A 11/01/2013    Procedure: THYROIDECTOMY;  Surgeon: Adolph Pollack, MD;  Location: [facility] ORS;  Service: General;  Laterality: N/A;  . Left heart catheterization with coronary angiogram N/A 05/07/2012    Procedure: LEFT HEART CATHETERIZATION WITH CORONARY ANGIOGRAM;  Surgeon: Marykay Lex, MD;  Location: [facility] CATH LAB;  Service: Cardiovascular;  Laterality: N/A;    There were no vitals filed for this visit.  Visit Diagnosis:  Abnormality of gait - Plan: PT plan of care cert/re-cert  Weakness of both lower extremities - Plan: PT plan of care cert/re-cert  Unsteadiness - Plan: PT plan of care cert/re-cert      Subjective Assessment - 07/03/15 1454    Subjective Pt reports her balance and gait has become progressively worse over last 6 months. Pt reported she has weakness in her legs, especiallywhen walking without rollator and going up/down steps. Pt reports she had OPPT neuro a  few years (not sure of exact dates). Pt reports she has another MRI next week to check for brain lesions (pt diagnosed with MSin 2005). Pt's husband assists pt with car transfers.    Patient is accompained by: Family member  pt's husband, Bethann Berkshire, present at end of session   Pertinent History Uncontrolled HTN, COPD, CAD, CHF, MS (per pt report), asthma, LBP, arthritis, hypothyroidism   Patient Stated Goals I want to be able to stand and not fall   Currently in Pain? Yes   Pain Score 10-Worst pain ever   Pain Location Back  shingle pain   Pain Orientation Left;Lower   Pain Descriptors / Indicators Burning;Tingling  itching   Pain Type Chronic pain    Pain Onset 1 to 4 weeks ago   Pain Frequency Constant   Aggravating Factors  clothes   Pain Relieving Factors ice            [facility] PT Assessment - 07/03/15 1458    Assessment   Medical Diagnosis Abnormality of gait   Referring Provider Butch Penny, NP   Onset Date/Surgical Date 12/31/14   Hand Dominance Right   Prior Therapy OPPT neuro (a few years ago)   Precautions   Precautions Fall   Restrictions   Weight Bearing Restrictions No   Balance Screen   Has the patient fallen in the past 6 months Yes   How many times? 5   Has the patient had a decrease in activity level because of a fear of falling?  Yes   Is the patient reluctant to leave their home because of a fear of falling?  Yes   Home Environment   Living Environment Private residence   Living Arrangements Spouse/significant other  husband   Available Help at Discharge Family;Other (Comment)  husband and sister-in-law   Type of Home House   Home Access Stairs to enter   Entrance Stairs-Number of Steps 3   Entrance Stairs-Rails None   Home Layout One level   Home Equipment Walker - 4 wheels;Grab bars - tub/shower;Cane - single point   Prior Function   Level of Independence Requires assistive device for independence;Independent with household mobility with device   Vocation Full time employment   Leisure watch TV, play games on computer   Cognition   Overall Cognitive Status Within Functional Limits for tasks assessed  but pt does report things don't come out the way she wants   Sensation   Light Touch Impaired by gross assessment   Additional Comments B N/T feet and hands. Pt reports decr. light touch in B hands and B LEs   Coordination   Gross Motor Movements are Fluid and Coordinated No   Fine Motor Movements are Fluid and Coordinated No   Posture/Postural Control   Posture/Postural Control Postural limitations   Postural Limitations Forward head  trunk flexed   ROM / Strength   AROM / PROM / Strength  AROM;Strength   AROM   Overall AROM  Deficits   Overall AROM Comments B UE WFL and LE except for B hip flexion limited 2/2 weakness and L ankle DF (about to neutral).   Strength   Overall Strength Deficits   Overall Strength Comments B UE WFL. R LE: hip flex: 2+/5, knee ext: 4/5, knee flex: 4/5, ankle DF: 4/5 and seated hip abd: 3+/5. L LE: hip flex: 2/5, knee ext: 3+/5, knee flex: 3+/5, ankle DF: 2/5, hip abd seated: 3+/5.   Transfers   Transfers Sit to Stand;Stand  to Sit   Sit to Stand 5: Supervision;With upper extremity assist;From chair/3-in-1   Stand to Sit 4: Min guard;With upper extremity assist;To chair/3-in-1   Ambulation/Gait   Ambulation/Gait Yes   Ambulation/Gait Assistance 5: Supervision   Ambulation/Gait Assistance Details No overt LOB episodes.   Ambulation Distance (Feet) 75 Feet   Assistive device Rollator   Gait Pattern Step-through pattern;Decreased stride length;Decreased dorsiflexion - left;Shuffle;Trunk flexed  increased postural sway in lateral directions   Ambulation Surface Level;Indoor   Gait velocity 0.91   Standardized Balance Assessment   Standardized Balance Assessment Timed Up and Go Test   Timed Up and Go Test   TUG Normal TUG   Normal TUG (seconds) 34.4  with rollator         VITALS: Pt's BP and HR assessed after 75' of amb. BP: 179/117 HR: 89bpm Pt's BP and HR assessed 2 minutes after original reading: BP: 170/106 HR: 86bpm BP and HR after 10 minute seated rest break:  BP: 164/121 HR: 87bpm                  PT Education - 07/03/15 1548    Education provided Yes   Education Details PT discussed frequency/duration and outcome measure results. PT discussed elevated BP after amb. and Dr. Hazle Coca office recommenedations of taking medication as prescribed, which pt stated she is taking meds as prescribed. PT encouraged pt to go to ED if she experienced SOB, chest pain/tightness, weakness, confusion, or difficulty speaking.    Person(s) Educated Patient;Spouse  Johnny-husband   Methods Explanation   Comprehension Verbalized understanding          PT Short Term Goals - 07/03/15 1555    PT SHORT TERM GOAL #1   Title Pt will verbalize fall prevention strategies to decrease falls risk. Target date: 07/31/15   Status New   PT SHORT TERM GOAL #2   Title Pt will improve gait speed to >/=1.34ft/sec with LRAD to decrease falls risk. Target date: 07/31/15   Status New   PT SHORT TERM GOAL #3   Title Perform BERG and write goal if appropriate. Target date: 07/31/15   Status New   PT SHORT TERM GOAL #4   Title Pt will amb. 300' with LRAD over even terrain at MOD I level to improve functional mobillty. Target date: 07/31/15   Status New   PT SHORT TERM GOAL #5   Title Pt            PT Long Term Goals - 07/03/15 1557    PT LONG TERM GOAL #1   Title Pt will improve gait speed to >/=1.10ft/sec with LRAD to reduce falls risk. Target date: 08/28/15   Status New   PT LONG TERM GOAL #2   Title Pt will amb. 500' over even/paved surfaces with LRAD at MOD I level to improve functional mobility. Target date: 08/28/15   Status New   PT LONG TERM GOAL #3   Title Pt will improve TUG time with LRAD to </=20 seconds to reduce falls risk.Target date: 08/28/15   Status New   PT LONG TERM GOAL #4   Title Pt will report zero falls over the last 2 weeks to improve safety during functional mobilty. Target date: 08/28/15   Status New   PT LONG TERM GOAL #5   Title Pt will ascend/descend 4 steps with HHA in order to safely traverse steps at home. Target date: 08/28/15   Status New  Plan - 07/03/15 1550    Clinical Impression Statement Pt is a pleasant 64y/o female presenting to OPPT neuro with abormality of gait. Pt presented with gait deviations, impaired balance, decreased sensation, decreased strength, decreased endurance, elevated BP after activity and postural dysfunction. Pt's session ceased early 2/2 elevated  BP, pt's cardiologist informed.   Pt will benefit from skilled therapeutic intervention in order to improve on the following deficits Abnormal gait;Decreased endurance;Cardiopulmonary status limiting activity;Obesity;Pain;Postural dysfunction;Decreased coordination;Decreased mobility;Decreased balance;Decreased strength;Impaired sensation  pain will be monitored closely but not directly addressed   Rehab Potential Good   Clinical Impairments Affecting Rehab Potential Uncontrolled HTN, COPD, CAD, CHF, MS (per pt report), asthma, LBP, arthritis, hypothyroidism   PT Frequency 2x / week   PT Duration 8 weeks   PT Treatment/Interventions ADLs/Self Care Home Management;Biofeedback;Manual techniques;Therapeutic exercise;Balance training;Therapeutic activities;Functional mobility training;Stair training;Gait training;DME Instruction;Neuromuscular re-education;Patient/family education;Orthotic Fit/Training   PT Next Visit Plan Take pt's BP at rest and after activity, perform BERG and initiate strengthening/balance HEP   Consulted and Agree with Plan of Care Patient;Family member/caregiver   Family Member Consulted pt's husband-Johnny         Problem List Patient Active Problem List   Diagnosis Date Noted  . Cold feet 05/07/2014  . Cough 02/28/2014  . 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 Chronic diastolic HF (heart failure) (HCC) 10/17/2012  . HTN (hypertension) 10/17/2012  . H/O cardiac catheterization, 05/2012 with normal coronary arteries 10/17/2012  . Dysphagia 05/13/2012  . Pneumonia 05/11/2012  . Leukocytosis 05/10/2012  . NSTEMI, Type 2- Troponin 1.4 05/09/2012  . Goiter, chronic 05/09/2012  . Hypertensive hypertrophic cardiomyopathy, by echo 05/07/12 05/08/2012  . Acute respiratory failure with hypoxia (HCC) 05/06/2012  . Acute diastolic CHF (congestive heart failure) (HCC) 05/06/2012  . Morbid obesity (HCC) 05/06/2012  . Normal coronary arteries by cath 2008 & 12/ 2013; Normal  EF;  LVH on Echo   05/06/2012  . Acute on chronic diastolic HF (heart failure) (HCC) 05/06/2012  . HTN (hypertension), malignant 05/06/2012  . Community acquired pneumonia 10/20/2011  . Uncontrolled hypertension 10/20/2011  . Diabetes mellitus type 2, uncontrolled, with complications (HCC) 10/20/2011  . Hyperlipidemia 10/20/2011  . Multiple sclerosis (HCC) 10/20/2011  . Abnormality of gait 04/21/2011  . Pain in limb 04/21/2011  . Encounter for therapeutic drug monitoring 04/21/2011    Lakyia Behe L 07/03/2015, 4:02 PM  [facility] [address] [place], [place], [address] Phone: [phone]   Fax:  [phone]  Name: [name] Fry MRN: [id number] Date of Birth: [birth year]   Zerita Boers, PT,DPT 07/03/2015 4:02 PM Phone: [phone] Fax: [phone]

## 2015-07-03 NOTE — Telephone Encounter (Signed)
Have patient take extra 0.2 mg tablet of clonidine for BP > 170 systolic or > 100 diastolic.  Have her schedule appt to see me in early February

## 2015-07-03 NOTE — Telephone Encounter (Signed)
Spoke to Protivin, PT at [facility]  Pt today had BP readings of 170/106 & 179/117 today at PT with/post activity. She experienced some fatigue w/ exertion. Note pt had BP elevation on 1/23 when seen at [facility]. BP 136/86 when seen by [name] in Nov.   She has a history of uncontrolled HTN. Pt not noting any symptoms at present.  Pt reports she took her medications as prescribed. List of meds was verified by caller. I recommended getting a reading of BP at rest - this was not done prior to activity.  Explained w information given, unsure if BP elevation occurred w/ activity or already present.  She is aware I will defer to [name] for any considerations. Also routed to [facility] for help w/ med recommendations if warranted.

## 2015-07-03 NOTE — Telephone Encounter (Signed)
[name] Fry called in stating that the pt's diastolic blood pressure was running around 106-117 and she wanted to be advised on what to do.   Thanks

## 2015-07-04 ENCOUNTER — Other Ambulatory Visit: Payer: Self-pay | Admitting: Neurology

## 2015-07-07 ENCOUNTER — Ambulatory Visit
Admission: RE | Admit: 2015-07-07 | Discharge: 2015-07-07 | Disposition: A | Discharge status: Home / Self Care | Payer: Commercial Managed Care - HMO | Source: Ambulatory Visit | Attending: Adult Health | Admitting: Adult Health

## 2015-07-07 DIAGNOSIS — G35 Multiple sclerosis: Secondary | ICD-10-CM | POA: Diagnosis not present

## 2015-07-07 NOTE — Telephone Encounter (Signed)
Follow up ° ° ° ° ° °Returning a call to the nurse °

## 2015-07-07 NOTE — Telephone Encounter (Signed)
PT AWARE APPT MADE FOR  07-08-15 AT  2:20 PM .Zack Seal

## 2015-07-07 NOTE — Telephone Encounter (Signed)
Returned call to patient not at home.LMTC. 

## 2015-07-08 ENCOUNTER — Ambulatory Visit: Payer: Commercial Managed Care - HMO | Admitting: Pharmacist Clinician (PhC)/ Clinical Pharmacy Specialist

## 2015-07-09 ENCOUNTER — Ambulatory Visit (INDEPENDENT_AMBULATORY_CARE_PROVIDER_SITE_OTHER): Payer: Commercial Managed Care - HMO | Admitting: Pharmacist Clinician (PhC)/ Clinical Pharmacy Specialist

## 2015-07-09 ENCOUNTER — Telehealth: Payer: Self-pay

## 2015-07-09 ENCOUNTER — Encounter: Payer: Self-pay | Admitting: Pharmacist Clinician (PhC)/ Clinical Pharmacy Specialist

## 2015-07-09 VITALS — BP 132/84 | HR 80 | Ht 61.0 in | Wt 232.1 lb

## 2015-07-09 DIAGNOSIS — I10 Essential (primary) hypertension: Secondary | ICD-10-CM | POA: Diagnosis not present

## 2015-07-09 MED ORDER — CLONIDINE HCL 0.2 MG PO TABS: ORAL_TABLET | ORAL | Status: AC

## 2015-07-09 NOTE — Telephone Encounter (Signed)
-----   Message from Butch Penny, NP sent at 07/09/2015 11:44 AM EST ----- Overall no change from previous MRI. Please call the patient.

## 2015-07-09 NOTE — Assessment & Plan Note (Addendum)
Her pressure in the office today matches with her home reading from this morning, showing normal readings.  I have asked her to take an extra clonidine tablet just before starting therapy, to see if that will help keep those readings down.  If they continue to be elevated despite extra medications, we may have to look into modifying therapy to preserve blood pressure.  She will take the extra clonidine regularly on therapy days for then next 2 weeks and call with any concerns.  Otherwise I will see her again in 6 weeks

## 2015-07-09 NOTE — Progress Notes (Signed)
07/09/2015 [name] Fry 1950/09/07 [id number]   HPI:  [name] Fry is a 65 y.o. female patient of Dr Gwenlyn Found, with a PMH below who presents today for return hypertension clinic visit.  I usually see her about once each quarter, as her BP tends to fluctuate regularly.  Today she is here because of concerns about elevated pressures at therapy.  Her readings there have been 180-190/108-120.   Her history is significant for hypertension, hyperlipidemia and diabetes.  Repeated cardiac catheterizations in 2008 and in 2013 showed normal coronary arteries.  She was treated in December 2013 at [facility] for hypertensive emergency and pneumonia.   She has very labile blood pressures and we've had her on maximum therapy for some time.  She continues with the edarby 80 mg daily, amlodipine 10 mg daily, hydralazine 100 mg bid, clonidine 0.2 mg bid and metoprolol 100 mg bid.  She states compliance with all medications.    She quit smoking in 2005 and does not drink any alcohol or caffeine.  She has been aware of the need for salt restriction for some time and believes she is doing well with this, using [name] and avoiding high sodium foods.  Over the past couple of months she has started walking more frequently.  She will walk for 20-30 minutes in a store or up and down her street.  Overall she has dropped 18 pounds in the past year and she continues to work at decreasing her weight.  Her diabetes is not well controlled, although no A1c is available, her glucose tends to run in the 200s quite regularly.    She is unaware of any family history of cardiac disease, as she was adopted.  She has 3 children, all in their 65s, and none have cardiac concerns at this point.    While readings at therapy were quite elevated, she took her pressure at home this morning - 127/84   Current Outpatient Prescriptions  Medication Sig Dispense Refill  . amLODipine (NORVASC) 10 MG tablet Take 1 tablet by mouth  daily 90 tablet 2    . aspirin EC 81 MG tablet Take 81 mg by mouth every morning.    Marland Kitchen atorvastatin (LIPITOR) 20 MG tablet Take 1 tablet by mouth at  bedtime 90 tablet 2  . AVONEX PREFILLED 30 MCG/0.5ML PSKT injection Inject intramuscularly  18mg every week. 1 kit 3  . cloNIDine (CATAPRES) 0.2 MG tablet Take 1 tablet by mouth two  times daily 180 tablet 3  . EDARBI 80 MG TABS Take 1 tablet by mouth  daily 90 tablet 2  . ergocalciferol (VITAMIN D2) 50000 UNITS capsule Take 50,000 Units by mouth 2 (two) times a week.    . fluticasone (FLONASE) 50 MCG/ACT nasal spray Place 2 sprays into the nose daily. 1 g 0  . fluticasone (FLOVENT HFA) 220 MCG/ACT inhaler Inhale 2 puffs into the lungs 2 (two) times daily.    . furosemide (LASIX) 40 MG tablet Take 1 tablet by mouth  daily 90 tablet 3  . hydrALAZINE (APRESOLINE) 100 MG tablet Take 1 tablet by mouth 3  times daily 270 tablet 3  . insulin lispro protamine-lispro (HUMALOG 75/25 MIX) (75-25) 100 UNIT/ML SUSP injection Inject 60 Units into the skin every morning.    . interferon beta-1a (AVONEX) 30 MCG/0.5ML injection Inject 30 mcg into the muscle every Friday. TAKES QFRIDAY AT 800 PM    . liothyronine (CYTOMEL) 25 MCG tablet Take 50 mcg by mouth  2 (two) times daily.    . metoprolol (LOPRESSOR) 100 MG tablet Take 1 tablet by mouth two  times daily 180 tablet 2  . pantoprazole (PROTONIX) 40 MG tablet Take 1 tablet by mouth  daily 90 tablet 2  . pregabalin (LYRICA) 75 MG capsule Take 75 mg by mouth at bedtime.    . traZODone (DESYREL) 100 MG tablet Take 2 tablets by mouth at  bedtime 180 tablet 0   No current facility-administered medications for this visit.    Allergies  Allergen Reactions  . Codeine Other (See Comments)    PASS OUT  . Other     Poppy seeds.- EYES SWELL AND HIVES  . Penicillins Other (See Comments)    "PASS OUT, MESSES WITH MY HEART RATE"  . Tape     Pink tape-RASH AND ITCHING    Past Medical History  Diagnosis Date  . Diabetes mellitus   .  Hypertension 05/07/2012    Normal 2D Echo; Renal Arterial Doppler 02/02/2007 - less than 60% diameter reduction, left proximal renal artery  . Asthma   . COPD (chronic obstructive pulmonary disease) ([place])   . Multiple sclerosis ([place])   . Goiter   . Hypertensive hypertrophic cardiomyopathy, by echo 05/07/12 05/08/2012  . Dyspnea 12/12/2005    CHF and Unspecified Chest pain - Myocardial Perfusion - post stress EF-72, negative for ischemia  . Hx of cardiac catheterization     normal cardiac cath  . Chronic diastolic HF (heart failure) ([place]) 10/17/2012  . H/O cardiac catheterization 10/17/2012  . Hypothyroidism   . Depression   . Dyslipidemia   . Chronic low back pain   . Peripheral edema   . Neurogenic bladder   . Obesity   . Gait disorder   . GERD (gastroesophageal reflux disease)   . Arthritis   . Complication of anesthesia     HAS ASTHMA FLARES AFTER MOST SURGERIES  . Pneumonia DEC 2013    Blood pressure 132/84, pulse 80, height '5\' 1"'$  (1.549 m), weight 232 lb 1.6 oz (105.28 kg).     [name] PharmD CPP [facility]

## 2015-07-09 NOTE — Telephone Encounter (Signed)
Spoke to patient. Gave MRI results. Patient verbalized understanding.  

## 2015-07-09 NOTE — Patient Instructions (Signed)
Return for a a follow up appointment in 6 weeks  Your blood pressure today is 132/84   Check your blood pressure at home daily  and keep record of the readings.  Take extra clonidine tablet before therapy for the next week or two.  Then take extra clonidine tablet any day that BP is >170 systolic or > 100 diastolic  Take your BP meds as follows:  AM:  Clonidine 0.2 mg, Edarbi 80 mg, metoprolol 100 mg, hydralazine 100 mg  Noon:  Hydralazine 100 mg  PM: Clonidine 0.2 mg, amlodipine 10 mg, metoprolol 100 mg, hydralazine 100 mg  Bring all of your meds, your BP cuff and your record of home blood pressures to your next appointment.  Exercise as you're able, try to walk approximately 30 minutes per day.  Keep salt intake to a minimum, especially watch canned and prepared boxed foods.  Eat more fresh fruits and vegetables and fewer canned items.  Avoid eating in fast food restaurants.    HOW TO TAKE YOUR BLOOD PRESSURE: . Rest 5 minutes before taking your blood pressure. .  Don't smoke or drink caffeinated beverages for at least 30 minutes before. . Take your blood pressure before (not after) you eat. . Sit comfortably with your back supported and both feet on the floor (don't cross your legs). . Elevate your arm to heart level on a table or a desk. . Use the proper sized cuff. It should fit smoothly and snugly around your bare upper arm. There should be enough room to slip a fingertip under the cuff. The bottom edge of the cuff should be 1 inch above the crease of the elbow. . Ideally, take 3 measurements at one sitting and record the average.

## 2015-07-10 ENCOUNTER — Ambulatory Visit: Payer: BLUE CROSS/BLUE SHIELD | Attending: Adult Health | Admitting: Physical Therapy

## 2015-07-10 ENCOUNTER — Encounter: Payer: Self-pay | Admitting: Physical Therapy

## 2015-07-10 VITALS — BP 94/62 | HR 53

## 2015-07-10 DIAGNOSIS — R29898 Other symptoms and signs involving the musculoskeletal system: Secondary | ICD-10-CM | POA: Insufficient documentation

## 2015-07-10 DIAGNOSIS — R269 Unspecified abnormalities of gait and mobility: Secondary | ICD-10-CM

## 2015-07-10 DIAGNOSIS — R2681 Unsteadiness on feet: Secondary | ICD-10-CM | POA: Diagnosis present

## 2015-07-10 NOTE — Patient Instructions (Signed)
Functional Quadriceps: Sit to Stand    Sit on edge of chair, feet flat on floor. Stand upright, extending knees fully. Repeat __5-10__ times per set. Do _1_ sets per session. Do _1-2_ sessions per day.  http://orth.exer.us/735   Copyright  VHI. All rights reserved.  Toe / Heel Raise (Standing)    Standing with support (rollator or kitchen counter top), raise heels, then rock back on heels and raise toes. Repeat __10__ times. 1-2 times a day.  Copyright  VHI. All rights reserved.

## 2015-07-10 NOTE — Therapy (Signed)
[facility] [address] [place], [place], [address] Phone: [phone]   Fax:  [phone]  Physical Therapy Treatment  Patient Details  Name: [name] Fry MRN: [id number] Date of Birth: 03/14/1951 Referring Provider: Butch Penny, NP  Encounter Date: 07/10/2015      PT End of Session - 07/10/15 1456    Visit Number 2   Number of Visits 17   Date for PT Re-Evaluation 09/01/15   Authorization Type UHC   PT Start Time 1447   PT Stop Time 1532   PT Time Calculation (min) 45 min   Equipment Utilized During Treatment Gait belt   Activity Tolerance Treatment limited secondary to medical complications (Comment)  increased BP   Behavior During Therapy [facility] for tasks assessed/performed      Past Medical History  Diagnosis Date  . Diabetes mellitus   . Hypertension 05/07/2012    Normal 2D Echo; Renal Arterial Doppler 02/02/2007 - less than 60% diameter reduction, left proximal renal artery  . Asthma   . COPD (chronic obstructive pulmonary disease) (HCC)   . Multiple sclerosis (HCC)   . Goiter   . Hypertensive hypertrophic cardiomyopathy, by echo 05/07/12 05/08/2012  . Dyspnea 12/12/2005    CHF and Unspecified Chest pain - Myocardial Perfusion - post stress EF-72, negative for ischemia  . Hx of cardiac catheterization     normal cardiac cath  . Chronic diastolic HF (heart failure) (HCC) 10/17/2012  . H/O cardiac catheterization 10/17/2012  . Hypothyroidism   . Depression   . Dyslipidemia   . Chronic low back pain   . Peripheral edema   . Neurogenic bladder   . Obesity   . Gait disorder   . GERD (gastroesophageal reflux disease)   . Arthritis   . Complication of anesthesia     HAS ASTHMA FLARES AFTER MOST SURGERIES  . Pneumonia DEC 2013    Past Surgical History  Procedure Laterality Date  . Cardiac catheterization Left 05/06/2012    Moderate caliber vessel from mid LAD  . Cardiac catheterization Right  04/02/2007    Left ventricular hypertrophy, no coronary disease, no renal artery stenosis  . Cesarean section       3x -[id number] AND 1985  . Cholecystectomy      1973  . Breast surgery      2 tumors removed -right breast  . Appendectomy      1973  . Arthoscopic rotaor cuff repair Right 1995  . Joint replacement       right shoulder 1985   . Thyroidectomy N/A 11/01/2013    Procedure: THYROIDECTOMY;  Surgeon: Adolph Pollack, MD;  Location: [facility];  Service: General;  Laterality: N/A;  . Left heart catheterization with coronary angiogram N/A 05/07/2012    Procedure: LEFT HEART CATHETERIZATION WITH CORONARY ANGIOGRAM;  Surgeon: Marykay Lex, MD;  Location: [facility] CATH LAB;  Service: Cardiovascular;  Laterality: N/A;    Filed Vitals:   07/10/15 1452 07/10/15 1523  BP: 89/67 94/62  Pulse: 53     Visit Diagnosis:  Abnormality of gait  Weakness of both lower extremities  Unsteadiness      Subjective Assessment - 07/10/15 1452    Subjective No new complaints. No falls to report. No pain currently. Reports she did have high blood pressure this am and took a clonidine as she was told to Fry. Reports she is taking her BP medication as she was told to Fry by MD at yesterday's appointment. (per  md report she brought in she is to take an extra one before therapy for the next 2 weeks and then only if BP is >170 systolic or > 100 diastolic).                                   Patient is accompained by: Family member  spouse, waiting in lobby   Pertinent History Uncontrolled HTN, COPD, CAD, CHF, MS (per pt report), asthma, LBP, arthritis, hypothyroidism   Patient Stated Goals I want to be able to stand and not fall   Currently in Pain? No/denies   Pain Score 0-No pain           [facility] Adult PT Treatment/Exercise - 07/10/15 1459    Berg Balance Test   Sit to Stand Able to stand  independently using hands   Standing Unsupported Able to stand 2 minutes with supervision  increased postural  sway noted    Sitting with Back Unsupported but Feet Supported on Floor or Stool Able to sit safely and securely 2 minutes   Stand to Sit Controls descent by using hands   Transfers Able to transfer safely, minor use of hands   Standing Unsupported with Eyes Closed Able to stand 10 seconds with supervision   Standing Ubsupported with Feet Together Able to place feet together independently and stand for 1 minute with supervision   From Standing, Reach Forward with Outstretched Arm Can reach forward >5 cm safely (2")  4 inches   From Standing Position, Pick up Object from Floor Able to pick up shoe, needs supervision   From Standing Position, Turn to Look Behind Over each Shoulder Turn sideways only but maintains balance   Turn 360 Degrees Needs close supervision or verbal cueing   Standing Unsupported, Alternately Place Feet on Step/Stool Able to complete >2 steps/needs minimal assist   Standing Unsupported, One Foot in Front Able to take small step independently and hold 30 seconds   Standing on One Leg Able to lift leg independently and hold equal to or more than 3 seconds   Total Score 36     Exercises: Standing with rollator support: heel<>toe raise x 10 Sit<>stands x 10 reps  Cues on form, both issued as part of HEP.        PT Education - 07/10/15 1531    Education provided Yes   Education Details Berg balance test results;HEP- sit<>stands, heel<>toe raises   Person(s) Educated Patient   Methods Explanation;Demonstration;Verbal cues;Handout   Comprehension Verbalized understanding;Returned demonstration;Need further instruction;Verbal cues required          PT Short Term Goals - 07/03/15 1555    PT SHORT TERM GOAL #1   Title Pt will verbalize fall prevention strategies to decrease falls risk. Target date: 07/31/15   Status New   PT SHORT TERM GOAL #2   Title Pt will improve gait speed to >/=1.40ft/sec with LRAD to decrease falls risk. Target date: 07/31/15   Status New    PT SHORT TERM GOAL #3   Title Perform BERG and write goal if appropriate. Target date: 07/31/15   Status New   PT SHORT TERM GOAL #4   Title Pt will amb. 300' with LRAD over even terrain at MOD I level to improve functional mobillty. Target date: 07/31/15   Status New   PT SHORT TERM GOAL #5   Title Pt  PT Long Term Goals - 07/03/15 1557    PT LONG TERM GOAL #1   Title Pt will improve gait speed to >/=1.87ft/sec with LRAD to reduce falls risk. Target date: 08/28/15   Status New   PT LONG TERM GOAL #2   Title Pt will amb. 500' over even/paved surfaces with LRAD at MOD I level to improve functional mobility. Target date: 08/28/15   Status New   PT LONG TERM GOAL #3   Title Pt will improve TUG time with LRAD to </=20 seconds to reduce falls risk.Target date: 08/28/15   Status New   PT LONG TERM GOAL #4   Title Pt will report zero falls over the last 2 weeks to improve safety during functional mobilty. Target date: 08/28/15   Status New   PT LONG TERM GOAL #5   Title Pt will ascend/descend 4 steps with HHA in order to safely traverse steps at home. Target date: 08/28/15   Status New            Plan - 07/10/15 1456    Clinical Impression Statement Pt's BP was running on the low side compaired to previous session with pt's new BP medicine schedule. No issues noted during session. Berg Balance Test was completed today with pt scoring in the high fall risk category. Pt also given 2 new exercises for home to work on strengthening without any issues with performing them in session today. Pt is making steady progress toward goals. Pt is also quick to fatigue, needing frequent rest breaks of short durations.                                       Pt will benefit from skilled therapeutic intervention in order to improve on the following deficits Abnormal gait;Decreased endurance;Cardiopulmonary status limiting activity;Obesity;Pain;Postural dysfunction;Decreased coordination;Decreased  mobility;Decreased balance;Decreased strength;Impaired sensation  pain will be monitored closely but not directly addressed   Rehab Potential Good   Clinical Impairments Affecting Rehab Potential Uncontrolled HTN, COPD, CAD, CHF, MS (per pt report), asthma, LBP, arthritis, hypothyroidism   PT Frequency 2x / week   PT Duration 8 weeks   PT Treatment/Interventions ADLs/Self Care Home Management;Biofeedback;Manual techniques;Therapeutic exercise;Balance training;Therapeutic activities;Functional mobility training;Stair training;Gait training;DME Instruction;Neuromuscular re-education;Patient/family education;Orthotic Fit/Training   PT Next Visit Plan Continue to monitor pt's BP due to med changes;Advance HEP for more LE strengthening and balance.   Consulted and Agree with Plan of Care Patient;Family member/caregiver   Family Member Consulted pt's husband-Johnny        Problem List Patient Active Problem List   Diagnosis Date Noted  . Cold feet 05/07/2014  . Cough 02/28/2014  . 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 Chronic diastolic HF (heart failure) (HCC) 10/17/2012  . HTN (hypertension) 10/17/2012  . H/O cardiac catheterization, 05/2012 with normal coronary arteries 10/17/2012  . Dysphagia 05/13/2012  . Pneumonia 05/11/2012  . Leukocytosis 05/10/2012  . NSTEMI, Type 2- Troponin 1.4 05/09/2012  . Goiter, chronic 05/09/2012  . Hypertensive hypertrophic cardiomyopathy, by echo 05/07/12 05/08/2012  . Acute respiratory failure with hypoxia (HCC) 05/06/2012  . Acute diastolic CHF (congestive heart failure) (HCC) 05/06/2012  . Morbid obesity (HCC) 05/06/2012  . Normal coronary arteries by cath 2008 & 12/ 2013; Normal EF;  LVH on Echo   05/06/2012  . Acute on chronic diastolic HF (heart failure) (HCC) 05/06/2012  . HTN (hypertension), malignant 05/06/2012  . Community acquired pneumonia  10/20/2011  . Uncontrolled hypertension 10/20/2011  . Diabetes mellitus type 2, uncontrolled,  with complications (HCC) 10/20/2011  . Hyperlipidemia 10/20/2011  . Multiple sclerosis (HCC) 10/20/2011  . Abnormality of gait 04/21/2011  . Pain in limb 04/21/2011  . Encounter for therapeutic drug monitoring 04/21/2011    Sallyanne Kuster 07/10/2015, 10:14 PM  Sallyanne Kuster, PTA, [facility] [address] [place], [place] [address] [phone] 07/10/2015, 10:14 PM   Name: [name] Fry MRN: [id number] Date of Birth: 09-14-1950

## 2015-07-13 ENCOUNTER — Ambulatory Visit: Payer: BLUE CROSS/BLUE SHIELD | Admitting: Physical Therapy

## 2015-07-13 ENCOUNTER — Encounter: Payer: Self-pay | Admitting: Physical Therapy

## 2015-07-13 DIAGNOSIS — R29898 Other symptoms and signs involving the musculoskeletal system: Secondary | ICD-10-CM

## 2015-07-13 DIAGNOSIS — R2681 Unsteadiness on feet: Secondary | ICD-10-CM

## 2015-07-13 DIAGNOSIS — R269 Unspecified abnormalities of gait and mobility: Secondary | ICD-10-CM | POA: Diagnosis not present

## 2015-07-13 NOTE — Therapy (Signed)
[facility] [address] [place], [place], [address] Phone: [phone]   Fax:  [phone]  Physical Therapy Treatment  Patient Details  Name: [name] Fry MRN: [id number] Date of Birth: 1950/08/20 Referring Provider: Butch Penny, NP  Encounter Date: 07/13/2015      PT End of Session - 07/13/15 1451    Visit Number 3   Number of Visits 17   Date for PT Re-Evaluation 09/01/15   Authorization Type UHC   PT Start Time 1448   PT Stop Time 1530   PT Time Calculation (min) 42 min   Equipment Utilized During Treatment Gait belt   Activity Tolerance Treatment limited secondary to medical complications (Comment)  increased BP   Behavior During Therapy [facility] for tasks assessed/performed      Past Medical History  Diagnosis Date  . Diabetes mellitus   . Hypertension 05/07/2012    Normal 2D Echo; Renal Arterial Doppler 02/02/2007 - less than 60% diameter reduction, left proximal renal artery  . Asthma   . COPD (chronic obstructive pulmonary disease) (HCC)   . Multiple sclerosis (HCC)   . Goiter   . Hypertensive hypertrophic cardiomyopathy, by echo 05/07/12 05/08/2012  . Dyspnea 12/12/2005    CHF and Unspecified Chest pain - Myocardial Perfusion - post stress EF-72, negative for ischemia  . Hx of cardiac catheterization     normal cardiac cath  . Chronic diastolic HF (heart failure) (HCC) 10/17/2012  . H/O cardiac catheterization 10/17/2012  . Hypothyroidism   . Depression   . Dyslipidemia   . Chronic low back pain   . Peripheral edema   . Neurogenic bladder   . Obesity   . Gait disorder   . GERD (gastroesophageal reflux disease)   . Arthritis   . Complication of anesthesia     HAS ASTHMA FLARES AFTER MOST SURGERIES  . Pneumonia DEC 2013    Past Surgical History  Procedure Laterality Date  . Cardiac catheterization Left 05/06/2012    Moderate caliber vessel from mid LAD  . Cardiac catheterization Right  04/02/2007    Left ventricular hypertrophy, no coronary disease, no renal artery stenosis  . Cesarean section       3x -[id number] AND 1985  . Cholecystectomy      1973  . Breast surgery      2 tumors removed -right breast  . Appendectomy      1973  . Arthoscopic rotaor cuff repair Right 1995  . Joint replacement       right shoulder 1985   . Thyroidectomy N/A 11/01/2013    Procedure: THYROIDECTOMY;  Surgeon: Adolph Pollack, MD;  Location: [facility];  Service: General;  Laterality: N/A;  . Left heart catheterization with coronary angiogram N/A 05/07/2012    Procedure: LEFT HEART CATHETERIZATION WITH CORONARY ANGIOGRAM;  Surgeon: Marykay Lex, MD;  Location: [facility] CATH LAB;  Service: Cardiovascular;  Laterality: N/A;    Filed Vitals:   07/13/15 1450 07/13/15 1454 07/13/15 1502 07/13/15 1517  BP: 160/109- before session 152/106 - manual recheck before session 133/99- after 5 minutes rest with water 166/104- after exercises performed  Pulse: 79       Visit Diagnosis:  Weakness of both lower extremities  Unsteadiness      Subjective Assessment - 07/13/15 1450    Subjective No new complaints. Reports her BP was better today at home so she did not take the extra chlonipine. No falls or pain to report.   Patient  is accompained by: Family member  spouse in lobby   Pertinent History Uncontrolled HTN, COPD, CAD, CHF, MS (per pt report), asthma, LBP, arthritis, hypothyroidism   Patient Stated Goals I want to be able to stand and not fall   Currently in Pain? No/denies   Pain Score 0-No pain     Treament: Exercises: Sit<>stand x 10 reps with minimal UE support/assist  Standing with rollator support: - heel raises - mini squats - alternating hip abduction - alternating hip extension  Performed 10 reps each with seated rest breaks between each exercise. Cues of form and technique. Added new exercises to HEP.         PT Education - 07/13/15 1520    Education provided Yes    Education Details added to HEP: mini squats, alternating hip abdcution/extension   Person(s) Educated Patient   Methods Explanation;Demonstration;Verbal cues;Handout   Comprehension Verbalized understanding;Returned demonstration;Verbal cues required;Need further instruction          PT Short Term Goals - 07/03/15 1555    PT SHORT TERM GOAL #1   Title Pt will verbalize fall prevention strategies to decrease falls risk. Target date: 07/31/15   Status New   PT SHORT TERM GOAL #2   Title Pt will improve gait speed to >/=1.70ft/sec with LRAD to decrease falls risk. Target date: 07/31/15   Status New   PT SHORT TERM GOAL #3   Title Perform BERG and write goal if appropriate. Target date: 07/31/15   Status New   PT SHORT TERM GOAL #4   Title Pt will amb. 300' with LRAD over even terrain at MOD I level to improve functional mobillty. Target date: 07/31/15   Status New   PT SHORT TERM GOAL #5   Title Pt            PT Long Term Goals - 07/03/15 1557    PT LONG TERM GOAL #1   Title Pt will improve gait speed to >/=1.68ft/sec with LRAD to reduce falls risk. Target date: 08/28/15   Status New   PT LONG TERM GOAL #2   Title Pt will amb. 500' over even/paved surfaces with LRAD at MOD I level to improve functional mobility. Target date: 08/28/15   Status New   PT LONG TERM GOAL #3   Title Pt will improve TUG time with LRAD to </=20 seconds to reduce falls risk.Target date: 08/28/15   Status New   PT LONG TERM GOAL #4   Title Pt will report zero falls over the last 2 weeks to improve safety during functional mobilty. Target date: 08/28/15   Status New   PT LONG TERM GOAL #5   Title Pt will ascend/descend 4 steps with HHA in order to safely traverse steps at home. Target date: 08/28/15   Status New               Plan - 07/13/15 1451    Clinical Impression Statement Pt's BP elevated on arrival to gym today. Did decrease some with rest, however increased again with minimal exercise. Pt  advised to continue to monitor BP and to follow up with MD if continues to go up with activity. Pt making slow progress toward goals.          Pt will benefit from skilled therapeutic intervention in order to improve on the following deficits Abnormal gait;Decreased endurance;Cardiopulmonary status limiting activity;Obesity;Pain;Postural dysfunction;Decreased coordination;Decreased mobility;Decreased balance;Decreased strength;Impaired sensation  pain will be monitored closely but not directly addressed   Rehab  Potential Good   Clinical Impairments Affecting Rehab Potential Uncontrolled HTN, COPD, CAD, CHF, MS (per pt report), asthma, LBP, arthritis, hypothyroidism   PT Frequency 2x / week   PT Duration 8 weeks   PT Treatment/Interventions ADLs/Self Care Home Management;Biofeedback;Manual techniques;Therapeutic exercise;Balance training;Therapeutic activities;Functional mobility training;Stair training;Gait training;DME Instruction;Neuromuscular re-education;Patient/family education;Orthotic Fit/Training   PT Next Visit Plan Continue to monitor pt's BP due to med changes;Advance HEP  balance as able.   Consulted and Agree with Plan of Care Patient;Family member/caregiver   Family Member Consulted pt's husband-Johnny        Problem List Patient Active Problem List   Diagnosis Date Noted  . Cold feet 05/07/2014  . Cough 02/28/2014  . 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 Chronic diastolic HF (heart failure) (HCC) 10/17/2012  . HTN (hypertension) 10/17/2012  . H/O cardiac catheterization, 05/2012 with normal coronary arteries 10/17/2012  . Dysphagia 05/13/2012  . Pneumonia 05/11/2012  . Leukocytosis 05/10/2012  . NSTEMI, Type 2- Troponin 1.4 05/09/2012  . Goiter, chronic 05/09/2012  . Hypertensive hypertrophic cardiomyopathy, by echo 05/07/12 05/08/2012  . Acute respiratory failure with hypoxia (HCC) 05/06/2012  . Acute diastolic CHF (congestive heart failure) (HCC) 05/06/2012  .  Morbid obesity (HCC) 05/06/2012  . Normal coronary arteries by cath 2008 & 12/ 2013; Normal EF;  LVH on Echo   05/06/2012  . Acute on chronic diastolic HF (heart failure) (HCC) 05/06/2012  . HTN (hypertension), malignant 05/06/2012  . Community acquired pneumonia 10/20/2011  . Uncontrolled hypertension 10/20/2011  . Diabetes mellitus type 2, uncontrolled, with complications (HCC) 10/20/2011  . Hyperlipidemia 10/20/2011  . Multiple sclerosis (HCC) 10/20/2011  . Abnormality of gait 04/21/2011  . Pain in limb 04/21/2011  . Encounter for therapeutic drug monitoring 04/21/2011    Sallyanne Kuster 07/14/2015, 2:42 PM  Sallyanne Kuster, PTA, [facility] [address] [place], [place] [address] [phone] 07/14/2015, 2:42 PM   Name: YISELL SPRUNGER MRN: [id number] Date of Birth: 1950-08-07

## 2015-07-13 NOTE — Patient Instructions (Addendum)
Mini-Squats (Standing)    Stand with support (counter or rollator) . Bend knees slightly and then straighten back up tall. Hold for __3-5_ seconds. Repeat _10_ times. Do _1-2__ times a day.  Copyright  VHI. All rights reserved.  HIP: Abduction - Standing    Using rollator or counter top for support: Raise leg out and then back down to floor. Repeat with other leg (alternating legs; 10 reps each leg. 1-2 times a day.  Copyright  VHI. All rights reserved.  HIP / KNEE: Extension - Standing    Use rollator or counter top for support: alternate lifting legs out backwards and then back to floor. 10 reps each leg, 1-3 times a day.  Copyright  VHI. All rights reserved.

## 2015-07-17 ENCOUNTER — Ambulatory Visit: Payer: BLUE CROSS/BLUE SHIELD

## 2015-07-17 VITALS — BP 145/97 | HR 79

## 2015-07-17 DIAGNOSIS — R2681 Unsteadiness on feet: Secondary | ICD-10-CM

## 2015-07-17 DIAGNOSIS — R269 Unspecified abnormalities of gait and mobility: Secondary | ICD-10-CM

## 2015-07-17 DIAGNOSIS — R29898 Other symptoms and signs involving the musculoskeletal system: Secondary | ICD-10-CM

## 2015-07-17 NOTE — Therapy (Signed)
[facility] [address] [place], [place], [address] Phone: [phone]   Fax:  [phone]  Physical Therapy Treatment  Patient Details  Name: [name] Fry MRN: [id number] Date of Birth: 1950-09-17 Referring Provider: Butch Penny, NP  Encounter Date: 07/17/2015      PT End of Session - 07/17/15 1528    Visit Number 4   Number of Visits 17   Date for PT Re-Evaluation 09/01/15   Authorization Type U[employer]   PT Start Time 1447   PT Stop Time 1512   PT Time Calculation (min) 25 min   Equipment Utilized During Treatment Gait belt   Activity Tolerance Treatment limited secondary to medical complications (Comment)  incr. BP   Behavior During Therapy [facility] for tasks assessed/performed      Past Medical History  Diagnosis Date  . Diabetes mellitus   . Hypertension 05/07/2012    Normal 2D Echo; Renal Arterial Doppler 02/02/2007 - less than 60% diameter reduction, left proximal renal artery  . Asthma   . COPD (chronic obstructive pulmonary disease) (HCC)   . Multiple sclerosis (HCC)   . Goiter   . Hypertensive hypertrophic cardiomyopathy, by echo 05/07/12 05/08/2012  . Dyspnea 12/12/2005    CHF and Unspecified Chest pain - Myocardial Perfusion - post stress EF-72, negative for ischemia  . Hx of cardiac catheterization     normal cardiac cath  . Chronic diastolic HF (heart failure) (HCC) 10/17/2012  . H/O cardiac catheterization 10/17/2012  . Hypothyroidism   . Depression   . Dyslipidemia   . Chronic low back pain   . Peripheral edema   . Neurogenic bladder   . Obesity   . Gait disorder   . GERD (gastroesophageal reflux disease)   . Arthritis   . Complication of anesthesia     HAS ASTHMA FLARES AFTER MOST SURGERIES  . Pneumonia DEC 2013    Past Surgical History  Procedure Laterality Date  . Cardiac catheterization Left 05/06/2012    Moderate caliber vessel from mid LAD  . Cardiac catheterization Right 04/02/2007     Left ventricular hypertrophy, no coronary disease, no renal artery stenosis  . Cesarean section       3x -[id number] AND 1985  . Cholecystectomy      1973  . Breast surgery      2 tumors removed -right breast  . Appendectomy      1973  . Arthoscopic rotaor cuff repair Right 1995  . Joint replacement       right shoulder 1985   . Thyroidectomy N/A 11/01/2013    Procedure: THYROIDECTOMY;  Surgeon: Adolph Pollack, MD;  Location: [facility];  Service: General;  Laterality: N/A;  . Left heart catheterization with coronary angiogram N/A 05/07/2012    Procedure: LEFT HEART CATHETERIZATION WITH CORONARY ANGIOGRAM;  Surgeon: Marykay Lex, MD;  Location: [facility] CATH LAB;  Service: Cardiovascular;  Laterality: N/A;    Filed Vitals:   07/17/15 1453  BP: 145/97 after 75' of amb. From lobby to gym  Pulse: 79  Second reading taken after balance HEP: BP: 170/110 HR: 78bpm   Visit Diagnosis:  Unsteadiness  Abnormality of gait  Weakness of both lower extremities      Subjective Assessment - 07/17/15 1451    Subjective Pt denied falls or changes since last visit. Pt reports she did take extra clonodine prior to appt. today, but she's been walking a lot today as she was looking for a kitchen table  to purchase. Pt said [facility] nurse, Jillyn Hidden, is encouraging pt to install ramp to enter/exit home.    Patient is accompained by: Family member  spouse in lobby   Pertinent History Uncontrolled HTN, COPD, CAD, CHF, MS (per pt report), asthma, LBP, arthritis, hypothyroidism   Patient Stated Goals I want to be able to stand and not fall   Currently in Pain? No/denies        Neuro re-ed: Pt performed balance HEP in corner with chair in front for safety, plus min guard to S for safety. Seated rest break taken after each rep 2/2 pt's hx of uncontrolled HTN. Cues for technique. Please see pt instructions for details.                         PT Education - 07/17/15 1526    Education  provided Yes   Education Details Added static standing in corner balance HEP and reviewed sit<>stand HEP as pt reported occasoinal difficulty performing. PT also discussd placing pt on hold 2/2 uncontrolled HTN. PT will request parameters from MD during PT, as pt's BP increases significantly during sessions.    Person(s) Educated Patient;Spouse   Methods Explanation;Demonstration;Verbal cues;Handout   Comprehension Verbalized understanding;Returned demonstration          PT Short Term Goals - 07/17/15 1531    PT SHORT TERM GOAL #1   Title Pt will verbalize fall prevention strategies to decrease falls risk. Target date: 07/31/15   Status On-going   PT SHORT TERM GOAL #2   Title Pt will improve gait speed to >/=1.79ft/sec with LRAD to decrease falls risk. Target date: 07/31/15   Status On-going   PT SHORT TERM GOAL #3   Title Perform BERG and write goal if appropriate. Target date: 07/31/15   Status Achieved   PT SHORT TERM GOAL #4   Title Pt will amb. 300' with LRAD over even terrain at MOD I level to improve functional mobillty. Target date: 07/31/15   Status On-going   PT SHORT TERM GOAL #5   Title Pt will improve BERG score to 40/56 to decr. falls risk. Target date: 07/31/15   Status New           PT Long Term Goals - 07/17/15 1531    PT LONG TERM GOAL #1   Title Pt will improve gait speed to >/=1.52ft/sec with LRAD to reduce falls risk. Target date: 08/28/15   Status On-going   PT LONG TERM GOAL #2   Title Pt will amb. 500' over even/paved surfaces with LRAD at MOD I level to improve functional mobility. Target date: 08/28/15   Status On-going   PT LONG TERM GOAL #3   Title Pt will improve TUG time with LRAD to </=20 seconds to reduce falls risk.Target date: 08/28/15   Status On-going   PT LONG TERM GOAL #4   Title Pt will report zero falls over the last 2 weeks to improve safety during functional mobilty. Target date: 08/28/15   Status On-going   PT LONG TERM GOAL #5   Title  Pt will ascend/descend 4 steps with HHA in order to safely traverse steps at home. Target date: 08/28/15   Status On-going   Additional Long Term Goals   Additional Long Term Goals Yes   PT LONG TERM GOAL #6   Title Pt will improve BERG score to >/=44/56 to decr. falls risk. Target date: 08/28/15   Status New  Plan - 07/17/15 1528    Clinical Impression Statement Pt's session again limited today again 2/2 elevated BP, despite frequent seated rest breaks. Therefore, PT ceased session and placed pt on hold until HTN in controlled and/or BP parameters during PT set by MD. Pt demonstrated incr. postural sway during static standing balance activities, and would continue to benefit from skilled PT to improve safety during functional mobility, once HTN is controlled.   Pt will benefit from skilled therapeutic intervention in order to improve on the following deficits Abnormal gait;Decreased endurance;Cardiopulmonary status limiting activity;Obesity;Pain;Postural dysfunction;Decreased coordination;Decreased mobility;Decreased balance;Decreased strength;Impaired sensation   Rehab Potential Good   Clinical Impairments Affecting Rehab Potential Uncontrolled HTN, COPD, CAD, CHF, MS (per pt report), asthma, LBP, arthritis, hypothyroidism   PT Frequency 2x / week   PT Duration 8 weeks   PT Treatment/Interventions ADLs/Self Care Home Management;Biofeedback;Manual techniques;Therapeutic exercise;Balance training;Therapeutic activities;Functional mobility training;Stair training;Gait training;DME Instruction;Neuromuscular re-education;Patient/family education;Orthotic Fit/Training   PT Next Visit Plan PT on hold until BP is controlled and/or parameters set by MD. Continue to monitor pt's BP due to med changes;Advance HEP  balance as able.   PT Home Exercise Plan Balance/strengthening HEP   Consulted and Agree with Plan of Care Patient;Family member/caregiver   Family Member Consulted pt's  husband-Johnny        Problem List Patient Active Problem List   Diagnosis Date Noted  . Cold feet 05/07/2014  . Cough 02/28/2014  . 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 Chronic diastolic HF (heart failure) (HCC) 10/17/2012  . HTN (hypertension) 10/17/2012  . H/O cardiac catheterization, 05/2012 with normal coronary arteries 10/17/2012  . Dysphagia 05/13/2012  . Pneumonia 05/11/2012  . Leukocytosis 05/10/2012  . NSTEMI, Type 2- Troponin 1.4 05/09/2012  . Goiter, chronic 05/09/2012  . Hypertensive hypertrophic cardiomyopathy, by echo 05/07/12 05/08/2012  . Acute respiratory failure with hypoxia (HCC) 05/06/2012  . Acute diastolic CHF (congestive heart failure) (HCC) 05/06/2012  . Morbid obesity (HCC) 05/06/2012  . Normal coronary arteries by cath 2008 & 12/ 2013; Normal EF;  LVH on Echo   05/06/2012  . Acute on chronic diastolic HF (heart failure) (HCC) 05/06/2012  . HTN (hypertension), malignant 05/06/2012  . Community acquired pneumonia 10/20/2011  . Uncontrolled hypertension 10/20/2011  . Diabetes mellitus type 2, uncontrolled, with complications (HCC) 10/20/2011  . Hyperlipidemia 10/20/2011  . Multiple sclerosis (HCC) 10/20/2011  . Abnormality of gait 04/21/2011  . Pain in limb 04/21/2011  . Encounter for therapeutic drug monitoring 04/21/2011    Josy Peaden L 07/17/2015, 3:36 PM  [facility] [address] [place], [place], [address] Phone: [phone]   Fax:  [phone]  Name: [name] Fry MRN: [id number] Date of Birth: [birth year]    Zerita Boers, PT,DPT 07/17/2015 3:36 PM Phone: [phone] Fax: [phone]

## 2015-07-17 NOTE — Patient Instructions (Addendum)
Perform in corner with chair in front of you for safety OR perform at kitchen sink with chair behind you: only on days that your blood pressure is lower below 140/90.  Feet Apart, Varied Arm Positions - Eyes Open    With eyes open, feet shoulder width apart, arms at your side, look straight ahead at a stationary object. Hold __30__ seconds. Repeat __3__ times per session. Do __1__ sessions per day.  Copyright  VHI. All rights reserved.   Functional Quadriceps: Sit to Stand    Sit on edge of chair, feet flat on floor. Stand upright, extending knees fully. Repeat __5__ times per set. Do __2__ sets per session. Do __1__ sessions per day.  http://orth.exer.us/735   Copyright  VHI. All rights reserved.

## 2015-07-20 ENCOUNTER — Telehealth: Payer: Self-pay | Admitting: Pharmacist Clinician (PhC)/ Clinical Pharmacy Specialist

## 2015-07-20 MED ORDER — SPIRONOLACTONE 25 MG PO TABS: 25.0000 mg | ORAL_TABLET | Freq: Every day | ORAL | Status: DC

## 2015-07-20 NOTE — Telephone Encounter (Signed)
Pt has been put on hold with rehab due to BP elevations with exercise.  Home readings still running high, states was 200 systolic yesterday.   Will add spironolactone 25 mg and see her in 1 week.  Will do BMET at that time.

## 2015-07-21 ENCOUNTER — Ambulatory Visit: Payer: Commercial Managed Care - HMO | Admitting: Physical Therapy

## 2015-07-24 ENCOUNTER — Ambulatory Visit: Payer: Commercial Managed Care - HMO | Admitting: Physical Therapy

## 2015-07-28 ENCOUNTER — Ambulatory Visit (INDEPENDENT_AMBULATORY_CARE_PROVIDER_SITE_OTHER): Payer: Commercial Managed Care - HMO | Admitting: Pharmacist Clinician (PhC)/ Clinical Pharmacy Specialist

## 2015-07-28 ENCOUNTER — Encounter: Payer: Self-pay | Admitting: Pharmacist Clinician (PhC)/ Clinical Pharmacy Specialist

## 2015-07-28 ENCOUNTER — Other Ambulatory Visit: Payer: Self-pay | Admitting: Cardiovascular Disease

## 2015-07-28 VITALS — BP 156/96 | HR 76 | Ht 61.0 in | Wt 233.0 lb

## 2015-07-28 DIAGNOSIS — I10 Essential (primary) hypertension: Secondary | ICD-10-CM | POA: Diagnosis not present

## 2015-07-28 NOTE — Progress Notes (Signed)
07/28/2015 [name] Fry 12-Jun-1950 [id number]   HPI:  [name] Fry is a 65 y.o. female patient of Dr Gwenlyn Found, with a PMH below who presents today for return hypertension clinic visit.  I usually see her about once each quarter, as her BP tends to fluctuate regularly.  I last saw her just a few weeks ago, but she returns today because of elevated pressures at rehab.   They have stopped her sessions until her BP is better controlled or they have guidelines from [place] on when to stop.  Her history is significant for hypertension, hyperlipidemia and diabetes.  Repeated cardiac catheterizations in 2008 and in 2013 showed normal coronary arteries.  She was treated in December 2013 at [facility] for hypertensive emergency and pneumonia.   She has very labile blood pressures and we've had her on maximum therapy for some time.  She continues with the edarby 80 mg daily, amlodipine 10 mg daily, hydralazine 100 mg bid, clonidine 0.2 mg bid, metoprolol 100 mg bid, and spironolactone 25 mg qd.  She states compliance with all medications, although she does note some urinary frequency recently.    She quit smoking in 2005 and does not drink any alcohol or caffeine.  She has been aware of the need for salt restriction for some time and believes she is doing well with this, using [name] and avoiding high sodium foods.  Over the past couple of months she has started walking more frequently.  She will walk for 20-30 minutes in a store or up and down her street.  Overall she has dropped 18 pounds in the past year and she continues to work at decreasing her weight.  Her diabetes is not well controlled, although no A1c is available, her glucose tends to run in the 200s quite regularly.    She is unaware of any family history of cardiac disease, as she was adopted.  She has 3 children, all in their 76s, and none have cardiac concerns at this point.    She does not do any regular exercise.  She attends rehab, although has not  been able to recently, because of her hypertension.   At times she has walked laps around the [facility], but has not done so recently.  Home readings vary from 135-230, often with a 100 point difference in 24 hours.  Diastolic readings also vary, from 87-133.     Current Outpatient Prescriptions  Medication Sig Dispense Refill  . amLODipine (NORVASC) 10 MG tablet Take 1 tablet by mouth  daily 90 tablet 2  . aspirin EC 81 MG tablet Take 81 mg by mouth every morning.    Marland Kitchen atorvastatin (LIPITOR) 20 MG tablet Take 1 tablet by mouth at  bedtime 90 tablet 2  . AVONEX PREFILLED 30 MCG/0.5ML PSKT injection Inject intramuscularly  34mg every week. 1 kit 3  . cloNIDine (CATAPRES) 0.2 MG tablet Take 1 tablet by mouth twice daily and extra daily dose for BP >170/100 225 tablet 1  . EDARBI 80 MG TABS Take 1 tablet by mouth  daily 90 tablet 2  . ergocalciferol (VITAMIN D2) 50000 UNITS capsule Take 50,000 Units by mouth 2 (two) times a week.    . fluticasone (FLONASE) 50 MCG/ACT nasal spray Place 2 sprays into the nose daily. 1 g 0  . fluticasone (FLOVENT HFA) 220 MCG/ACT inhaler Inhale 2 puffs into the lungs 2 (two) times daily.    . furosemide (LASIX) 40 MG tablet Take 1  tablet by mouth  daily 90 tablet 3  . hydrALAZINE (APRESOLINE) 100 MG tablet Take 1 tablet by mouth 3  times daily 270 tablet 3  . insulin lispro protamine-lispro (HUMALOG 75/25 MIX) (75-25) 100 UNIT/ML SUSP injection Inject 60 Units into the skin every morning.    . interferon beta-1a (AVONEX) 30 MCG/0.5ML injection Inject 30 mcg into the muscle every Friday. TAKES QFRIDAY AT 800 PM    . liothyronine (CYTOMEL) 25 MCG tablet Take 50 mcg by mouth 2 (two) times daily.    . metoprolol (LOPRESSOR) 100 MG tablet Take 1 tablet by mouth two  times daily 180 tablet 2  . pantoprazole (PROTONIX) 40 MG tablet Take 1 tablet by mouth  daily 90 tablet 2  . pregabalin (LYRICA) 75 MG capsule Take 75 mg by mouth at bedtime.    Marland Kitchen spironolactone  (ALDACTONE) 25 MG tablet Take 1 tablet (25 mg total) by mouth daily. 30 tablet 3  . traZODone (DESYREL) 100 MG tablet Take 2 tablets by mouth at  bedtime 180 tablet 0   No current facility-administered medications for this visit.    Allergies  Allergen Reactions  . Codeine Other (See Comments)    PASS OUT  . Other     Poppy seeds.- EYES SWELL AND HIVES  . Penicillins Other (See Comments)    "PASS OUT, MESSES WITH MY HEART RATE"  . Tape     Pink tape-RASH AND ITCHING    Past Medical History  Diagnosis Date  . Diabetes mellitus   . Hypertension 05/07/2012    Normal 2D Echo; Renal Arterial Doppler 02/02/2007 - less than 60% diameter reduction, left proximal renal artery  . Asthma   . COPD (chronic obstructive pulmonary disease) ([place])   . Multiple sclerosis ([place])   . Goiter   . Hypertensive hypertrophic cardiomyopathy, by echo 05/07/12 05/08/2012  . Dyspnea 12/12/2005    CHF and Unspecified Chest pain - Myocardial Perfusion - post stress EF-72, negative for ischemia  . Hx of cardiac catheterization     normal cardiac cath  . Chronic diastolic HF (heart failure) ([place]) 10/17/2012  . H/O cardiac catheterization 10/17/2012  . Hypothyroidism   . Depression   . Dyslipidemia   . Chronic low back pain   . Peripheral edema   . Neurogenic bladder   . Obesity   . Gait disorder   . GERD (gastroesophageal reflux disease)   . Arthritis   . Complication of anesthesia     HAS ASTHMA FLARES AFTER MOST SURGERIES  . Pneumonia DEC 2013    Blood pressure 156/96, pulse 76, height _0  (1.549 m), weight 233 lb (105.688 kg).     [name] PharmD CPP [facility]

## 2015-07-28 NOTE — Patient Instructions (Addendum)
Return for a a follow up appointment in 1 month  Your blood pressure today is 156/96  (goal is < 150/90)  Check your blood pressure at home daily and keep record of the readings.  Take your BP meds as follows: go to lab today for blood draw.  Will call you tomorrow with results and any changes in medications  Increase your exercise (walking) to at least 3 days each week  Bring all of your meds, your BP cuff and your record of home blood pressures to your next appointment.  Exercise as you're able, try to walk approximately 30 minutes per day.  Keep salt intake to a minimum, especially watch canned and prepared boxed foods.  Eat more fresh fruits and vegetables and fewer canned items.  Avoid eating in fast food restaurants.    HOW TO TAKE YOUR BLOOD PRESSURE: . Rest 5 minutes before taking your blood pressure. .  Don't smoke or drink caffeinated beverages for at least 30 minutes before. . Take your blood pressure before (not after) you eat. . Sit comfortably with your back supported and both feet on the floor (don't cross your legs). . Elevate your arm to heart level on a table or a desk. . Use the proper sized cuff. It should fit smoothly and snugly around your bare upper arm. There should be enough room to slip a fingertip under the cuff. The bottom edge of the cuff should be 1 inch above the crease of the elbow. . Ideally, take 3 measurements at one sitting and record the average.

## 2015-07-28 NOTE — Assessment & Plan Note (Signed)
Her BP remains uncontrolled, with wide swings from day to day.  She states compliance with daily medications.  I reviewed her information with [name] and we agree that therapy is beneficial to keeping her blood pressure down.  I will contact the rehab group and let them know that she can continue as long as her BP is <200/110.  She was also encouraged to increase her home exercise, walking thru the [facility] or other stores at least 2-3 times per week.  I am going to have her get a BMET today, as she started on the spironolactone about 2 weeks ago.  If her labs look good, I am going to increase the spironolactone from 25 to 50 mg daily.  I will see her back in 1 month for follow up

## 2015-07-29 ENCOUNTER — Ambulatory Visit: Payer: BLUE CROSS/BLUE SHIELD | Admitting: Physical Therapy

## 2015-07-29 ENCOUNTER — Telehealth: Payer: Self-pay | Admitting: Physical Therapy

## 2015-07-29 LAB — BASIC METABOLIC PANEL
BUN: 13 mg/dL (ref 7–25)
CHLORIDE: 102 mmol/L (ref 98–110)
CO2: 29 mmol/L (ref 20–31)
CREATININE: 1.06 mg/dL — AB (ref 0.50–0.99)
Calcium: 9.1 mg/dL (ref 8.6–10.4)
GLUCOSE: 260 mg/dL — AB (ref 65–99)
Potassium: 3.5 mmol/L (ref 3.5–5.3)
Sodium: 140 mmol/L (ref 135–146)

## 2015-07-29 NOTE — Telephone Encounter (Signed)
called pt and left message about missed rehab (PT) appointment. Left date/time of next appointment and for pt to call if she is not going to make it, along with clinic phone number.   Sallyanne Kuster, PTA, [facility] [address] [place], [place] [address] [phone] 07/29/2015, 3:07 PM

## 2015-07-30 ENCOUNTER — Other Ambulatory Visit: Payer: Self-pay | Admitting: Pharmacist Clinician (PhC)/ Clinical Pharmacy Specialist

## 2015-07-30 MED ORDER — SPIRONOLACTONE 25 MG PO TABS: 50.0000 mg | ORAL_TABLET | Freq: Every day | ORAL | Status: DC

## 2015-07-31 ENCOUNTER — Ambulatory Visit: Payer: BLUE CROSS/BLUE SHIELD | Admitting: Physical Therapy

## 2015-07-31 ENCOUNTER — Encounter: Payer: Self-pay | Admitting: Physical Therapy

## 2015-07-31 VITALS — BP 148/92 | HR 95 | Resp 19

## 2015-07-31 DIAGNOSIS — R29898 Other symptoms and signs involving the musculoskeletal system: Secondary | ICD-10-CM

## 2015-07-31 DIAGNOSIS — R2681 Unsteadiness on feet: Secondary | ICD-10-CM

## 2015-07-31 DIAGNOSIS — R269 Unspecified abnormalities of gait and mobility: Secondary | ICD-10-CM | POA: Diagnosis not present

## 2015-07-31 NOTE — Therapy (Signed)
[facility] [address] [place], [place], [address] Phone: [phone]   Fax:  [phone]  Physical Therapy Treatment  Patient Details  Name: [name] Fry MRN: [id number] Date of Birth: Jan 06, 1951 Referring Provider: Butch Penny, NP  Encounter Date: 07/31/2015      PT End of Session - 07/31/15 1543    Visit Number 5   Number of Visits 17   Date for PT Re-Evaluation 09/01/15   Authorization Type UHC   PT Start Time 1448   PT Stop Time 1530   PT Time Calculation (min) 42 min   Equipment Utilized During Treatment Gait belt   Activity Tolerance Treatment limited secondary to medical complications (Comment)  incr. BP   Behavior During Therapy [facility] for tasks assessed/performed      Past Medical History  Diagnosis Date  . Diabetes mellitus   . Hypertension 05/07/2012    Normal 2D Echo; Renal Arterial Doppler 02/02/2007 - less than 60% diameter reduction, left proximal renal artery  . Asthma   . COPD (chronic obstructive pulmonary disease) (HCC)   . Multiple sclerosis (HCC)   . Goiter   . Hypertensive hypertrophic cardiomyopathy, by echo 05/07/12 05/08/2012  . Dyspnea 12/12/2005    CHF and Unspecified Chest pain - Myocardial Perfusion - post stress EF-72, negative for ischemia  . Hx of cardiac catheterization     normal cardiac cath  . Chronic diastolic HF (heart failure) (HCC) 10/17/2012  . H/O cardiac catheterization 10/17/2012  . Hypothyroidism   . Depression   . Dyslipidemia   . Chronic low back pain   . Peripheral edema   . Neurogenic bladder   . Obesity   . Gait disorder   . GERD (gastroesophageal reflux disease)   . Arthritis   . Complication of anesthesia     HAS ASTHMA FLARES AFTER MOST SURGERIES  . Pneumonia DEC 2013    Past Surgical History  Procedure Laterality Date  . Cardiac catheterization Left 05/06/2012    Moderate caliber vessel from mid LAD  . Cardiac catheterization Right 04/02/2007     Left ventricular hypertrophy, no coronary disease, no renal artery stenosis  . Cesarean section       3x -[id number] AND 1985  . Cholecystectomy      1973  . Breast surgery      2 tumors removed -right breast  . Appendectomy      1973  . Arthoscopic rotaor cuff repair Right 1995  . Joint replacement       right shoulder 1985   . Thyroidectomy N/A 11/01/2013    Procedure: THYROIDECTOMY;  Surgeon: Adolph Pollack, MD;  Location: [facility];  Service: General;  Laterality: N/A;  . Left heart catheterization with coronary angiogram N/A 05/07/2012    Procedure: LEFT HEART CATHETERIZATION WITH CORONARY ANGIOGRAM;  Surgeon: Marykay Lex, MD;  Location[facility] C[facility] LAB;  Service: Cardiovascular;  Laterality: N/A;    Filed Vitals:   07/31/15 1501 07/31/15 1514  BP: 140/92- before session 148/92- after gait  Pulse:  95  Resp:  19  SpO2:  99%    Visit Diagnosis:  Unsteadiness  Abnormality of gait  Weakness of both lower extremities      Subjective Assessment - 07/31/15 1502    Subjective Patient states she has no new falls or pain. Patient states she's easily tired after moving around a lot.   Pertinent History Uncontrolled HTN   Limitations Other (comment)  Doctor's orders: Patient may continue  therapy as long as BP < 200/110.   Patient Stated Goals Walk without blood pressure getting too high   Currently in Pain? No/denies          [facility] Adult PT Treatment/Exercise - 07/31/15 1504    Ambulation/Gait   Ambulation/Gait Yes   Ambulation/Gait Assistance 5: Supervision   Ambulation/Gait Assistance Details Verbal cues for increased heel strike; no LOB noted   Ambulation Distance (Feet) 325 Feet   Assistive device Rollator   Gait Pattern Step-through pattern;Decreased stride length;Decreased dorsiflexion - left;Shuffle;Trunk flexed   Ambulation Surface Level;Indoor   Gait velocity 1.11   Exercises   Exercises Knee/Hip   Knee/Hip Exercises: Standing   Heel Raises 1 set;5 reps    Hip Abduction 1 set;5 reps   Hip Extension 1 set;5 sets   Functional Squat 1 set;5 reps  mini-squats           PT Education - 07/31/15 1541    Education provided Yes   Education Details Patient educated in HEP compliance with doctor's orders; patient able to continue as long as BP <200/110. Reviewed HEP strengthening exercises for form.   Person(s) Educated Patient   Methods Explanation   Comprehension Verbalized understanding;Returned demonstration          PT Short Term Goals - 07/31/15 1548    PT SHORT TERM GOAL #1   Title Pt will verbalize fall prevention strategies to decrease falls risk. Target date: 07/31/15   Status On-going   PT SHORT TERM GOAL #2   Title Pt will improve gait speed to >/=1.66ft/sec with LRAD to decrease falls risk. Target date: 07/31/15   Baseline Current as of 07/31/15 gait speed 1.58ft/sec Rollator   Status On-going   PT SHORT TERM GOAL #3   Title Perform BERG and write goal if appropriate. Target date: 07/31/15   Status Achieved   PT SHORT TERM GOAL #4   Title Pt will amb. 300' with LRAD over even terrain at MOD I level to improve functional mobillty. Target date: 07/31/15   Baseline Pt amb 325' with Rollator on indoor, level surfaces MOD I.    Status Achieved   PT SHORT TERM GOAL #5   Title Pt will improve BERG score to 40/56 to decr. falls risk. Target date: 07/31/15   Status New           PT Long Term Goals - 07/17/15 1531    PT LONG TERM GOAL #1   Title Pt will improve gait speed to >/=1.75ft/sec with LRAD to reduce falls risk. Target date: 08/28/15   Status On-going   PT LONG TERM GOAL #2   Title Pt will amb. 500' over even/paved surfaces with LRAD at MOD I level to improve functional mobility. Target date: 08/28/15   Status On-going   PT LONG TERM GOAL #3   Title Pt will improve TUG time with LRAD to </=20 seconds to reduce falls risk.Target date: 08/28/15   Status On-going   PT LONG TERM GOAL #4   Title Pt will report zero falls  over the last 2 weeks to improve safety during functional mobilty. Target date: 08/28/15   Status On-going   PT LONG TERM GOAL #5   Title Pt will ascend/descend 4 steps with HHA in order to safely traverse steps at home. Target date: 08/28/15   Status On-going   Additional Long Term Goals   Additional Long Term Goals Yes   PT LONG TERM GOAL #6   Title Pt will improve BERG  score to >/=44/56 to decr. falls risk. Target date: 08/28/15   Status New           Plan - 07/31/15 1544    Clinical Impression Statement Skilled session addressing deficits in activity tolerance, gait, and strength. Patient able to now ambulate >300', meeting STG. Patient continues to fatigue easily and has issues with uncontrolled HTN. Doctor's orders now allow continuing exercise as long as BP <200/110. Patient continues to improve exercise tolerance and is making slow but continuous progress toward goals.   Pt will benefit from skilled therapeutic intervention in order to improve on the following deficits Abnormal gait;Decreased endurance;Cardiopulmonary status limiting activity;Obesity;Pain;Postural dysfunction;Decreased coordination;Decreased mobility;Decreased balance;Decreased strength;Impaired sensation   Rehab Potential Good   Clinical Impairments Affecting Rehab Potential Uncontrolled HTN, COPD, CAD, CHF, MS (per pt report), asthma, LBP, arthritis, hypothyroidism   PT Frequency 2x / week   PT Duration 8 weeks   PT Treatment/Interventions ADLs/Self Care Home Management;Biofeedback;Manual techniques;Therapeutic exercise;Balance training;Therapeutic activities;Functional mobility training;Stair training;Gait training;DME Instruction;Neuromuscular re-education;Patient/family education;Orthotic Fit/Training   PT Next Visit Plan Review Balance HEP, assess other ST goals including BERG.   PT Home Exercise Plan Reviewed strengthening HEP; need to review balance.   Consulted and Agree with Plan of Care Patient         Problem List Patient Active Problem List   Diagnosis Date Noted  . Cold feet 05/07/2014  . Cough 02/28/2014  . 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 Chronic diastolic HF (heart failure) (HCC) 10/17/2012  . HTN (hypertension) 10/17/2012  . H/O cardiac catheterization, 05/2012 with normal coronary arteries 10/17/2012  . Dysphagia 05/13/2012  . Pneumonia 05/11/2012  . Leukocytosis 05/10/2012  . NSTEMI, Type 2- Troponin 1.4 05/09/2012  . Goiter, chronic 05/09/2012  . Hypertensive hypertrophic cardiomyopathy, by echo 05/07/12 05/08/2012  . Acute respiratory failure with hypoxia (HCC) 05/06/2012  . Acute diastolic CHF (congestive heart failure) (HCC) 05/06/2012  . Morbid obesity (HCC) 05/06/2012  . Normal coronary arteries by cath 2008 & 12/ 2013; Normal EF;  LVH on Echo   05/06/2012  . Acute on chronic diastolic HF (heart failure) (HCC) 05/06/2012  . HTN (hypertension), malignant 05/06/2012  . Community acquired pneumonia 10/20/2011  . Uncontrolled hypertension 10/20/2011  . Diabetes mellitus type 2, uncontrolled, with complications (HCC) 10/20/2011  . Hyperlipidemia 10/20/2011  . Multiple sclerosis (HCC) 10/20/2011  . Abnormality of gait 04/21/2011  . Pain in limb 04/21/2011  . Encounter for therapeutic drug monitoring 04/21/2011    Otis Dials, SPTA 07/31/2015, 4:19 PM  [facility] [address] [place], [place], [address] Phone: [phone]   Fax:  [phone]  Name: KEYLEN UZELAC MRN: [id number] Date of Birth: 08/24/1950  This note has been reviewed and edited by supervising CI.  Sallyanne Kuster, PTA, [facility] [address], [address] [place], [place] [address] [phone] 08/02/2015, 10:45 PM

## 2015-08-05 ENCOUNTER — Encounter: Payer: Self-pay | Admitting: Physical Therapy

## 2015-08-05 ENCOUNTER — Ambulatory Visit: Payer: BLUE CROSS/BLUE SHIELD | Attending: Adult Health | Admitting: Physical Therapy

## 2015-08-05 VITALS — BP 140/92

## 2015-08-05 DIAGNOSIS — R2681 Unsteadiness on feet: Secondary | ICD-10-CM | POA: Diagnosis present

## 2015-08-05 DIAGNOSIS — R269 Unspecified abnormalities of gait and mobility: Secondary | ICD-10-CM | POA: Diagnosis present

## 2015-08-05 DIAGNOSIS — R29898 Other symptoms and signs involving the musculoskeletal system: Secondary | ICD-10-CM | POA: Insufficient documentation

## 2015-08-05 NOTE — Patient Instructions (Signed)
Fall Prevention in the Home  Falls can cause injuries and can affect people from all age groups. There are many simple things that you can do to make your home safe and to help prevent falls. WHAT CAN I DO ON THE OUTSIDE OF MY HOME?  Regularly repair the edges of walkways and driveways and fix any cracks.  Remove high doorway thresholds.  Trim any shrubbery on the main path into your home.  Use bright outdoor lighting.  Clear walkways of debris and clutter, including tools and rocks.  Regularly check that handrails are securely fastened and in good repair. Both sides of any steps should have handrails.  Install guardrails along the edges of any raised decks or porches.  Have leaves, snow, and ice cleared regularly.  Use sand or salt on walkways during winter months.  In the garage, clean up any spills right away, including grease or oil spills. WHAT CAN I DO IN THE BATHROOM?  Use night lights.  Install grab bars by the toilet and in the tub and shower. Do not use towel bars as grab bars.  Use non-skid mats or decals on the floor of the tub or shower.  If you need to sit down while you are in the shower, use a plastic, non-slip stool..  Keep the floor dry. Immediately clean up any water that spills on the floor.  Remove soap buildup in the tub or shower on a regular basis.  Attach bath mats securely with double-sided non-slip rug tape.  Remove throw rugs and other tripping hazards from the floor. WHAT CAN I DO IN THE BEDROOM?  Use night lights.  Make sure that a bedside light is easy to reach.  Do not use oversized bedding that drapes onto the floor.  Have a firm chair that has side arms to use for getting dressed.  Remove throw rugs and other tripping hazards from the floor. WHAT CAN I DO IN THE KITCHEN?   Clean up any spills right away.  Avoid walking on wet floors.  Place frequently used items in easy-to-reach places.  If you need to reach for something  above you, use a sturdy step stool that has a grab bar.  Keep electrical cables out of the way.  Do not use floor polish or wax that makes floors slippery. If you have to use wax, make sure that it is non-skid floor wax.  Remove throw rugs and other tripping hazards from the floor. WHAT CAN I DO IN THE STAIRWAYS?  Do not leave any items on the stairs.  Make sure that there are handrails on both sides of the stairs. Fix handrails that are broken or loose. Make sure that handrails are as long as the stairways.  Check any carpeting to make sure that it is firmly attached to the stairs. Fix any carpet that is loose or worn.  Avoid having throw rugs at the top or bottom of stairways, or secure the rugs with carpet tape to prevent them from moving.  Make sure that you have a light switch at the top of the stairs and the bottom of the stairs. If you do not have them, have them installed. WHAT ARE SOME OTHER FALL PREVENTION TIPS?  Wear closed-toe shoes that fit well and support your feet. Wear shoes that have rubber soles or low heels.  When you use a stepladder, make sure that it is completely opened and that the sides are firmly locked. Have someone hold the ladder while you   are using it. Do not climb a closed stepladder.  Add color or contrast paint or tape to grab bars and handrails in your home. Place contrasting color strips on the first and last steps.  Use mobility aids as needed, such as canes, walkers, scooters, and crutches.  Turn on lights if it is dark. Replace any light bulbs that burn out.  Set up furniture so that there are clear paths. Keep the furniture in the same spot.  Fix any uneven floor surfaces.  Choose a carpet design that does not hide the edge of steps of a stairway.  Be aware of any and all pets.  Review your medicines with your healthcare provider. Some medicines can cause dizziness or changes in blood pressure, which increase your risk of falling. Talk  with your health care provider about other ways that you can decrease your risk of falls. This may include working with a physical therapist or trainer to improve your strength, balance, and endurance.   This information is not intended to replace advice given to you by your health care provider. Make sure you discuss any questions you have with your health care provider.   Document Released: 05/13/2002 Document Revised: 10/07/2014 Document Reviewed: 06/27/2014 Elsevier Interactive Patient Education 2016 Elsevier Inc.  

## 2015-08-06 NOTE — Therapy (Signed)
[facility] [address] [place], [place], [address] Phone: [phone]   Fax:  [phone]  Physical Therapy Treatment  Patient Details  Name: [name] Fry MRN: [id number] Date of Birth: 1950-07-31 Referring Provider: Butch Penny, NP  Encounter Date: 08/05/2015      PT End of Session - 08/05/15 1523    Visit Number 6   Number of Visits 17   Date for PT Re-Evaluation 09/01/15   Authorization Type UHC   PT Start Time 1443   PT Stop Time 1522   PT Time Calculation (min) 39 min   Equipment Utilized During Treatment Gait belt   Activity Tolerance Treatment limited secondary to medical complications (Comment)  incr. BP   Behavior During Therapy [facility] for tasks assessed/performed      Past Medical History  Diagnosis Date  . Diabetes mellitus   . Hypertension 05/07/2012    Normal 2D Echo; Renal Arterial Doppler 02/02/2007 - less than 60% diameter reduction, left proximal renal artery  . Asthma   . COPD (chronic obstructive pulmonary disease) (HCC)   . Multiple sclerosis (HCC)   . Goiter   . Hypertensive hypertrophic cardiomyopathy, by echo 05/07/12 05/08/2012  . Dyspnea 12/12/2005    CHF and Unspecified Chest pain - Myocardial Perfusion - post stress EF-72, negative for ischemia  . Hx of cardiac catheterization     normal cardiac cath  . Chronic diastolic HF (heart failure) (HCC) 10/17/2012  . H/O cardiac catheterization 10/17/2012  . Hypothyroidism   . Depression   . Dyslipidemia   . Chronic low back pain   . Peripheral edema   . Neurogenic bladder   . Obesity   . Gait disorder   . GERD (gastroesophageal reflux disease)   . Arthritis   . Complication of anesthesia     HAS ASTHMA FLARES AFTER MOST SURGERIES  . Pneumonia DEC 2013    Past Surgical History  Procedure Laterality Date  . Cardiac catheterization Left 05/06/2012    Moderate caliber vessel from mid LAD  . Cardiac catheterization Right 04/02/2007     Left ventricular hypertrophy, no coronary disease, no renal artery stenosis  . Cesarean section       3x -[id number] AND 1985  . Cholecystectomy      1973  . Breast surgery      2 tumors removed -right breast  . Appendectomy      1973  . Arthoscopic rotaor cuff repair Right 1995  . Joint replacement       right shoulder 1985   . Thyroidectomy N/A 11/01/2013    Procedure: THYROIDECTOMY;  Surgeon: Adolph Pollack, MD;  Location: [facility];  Service: General;  Laterality: N/A;  . Left heart catheterization with coronary angiogram N/A 05/07/2012    Procedure: LEFT HEART CATHETERIZATION WITH CORONARY ANGIOGRAM;  Surgeon: Marykay Lex, MD;  Location: [facility] CATH LAB;  Service: Cardiovascular;  Laterality: N/A;    Filed Vitals:   08/05/15 1522  BP: 140/92    Visit Diagnosis:  Unsteadiness  Abnormality of gait  Weakness of both lower extremities      Subjective Assessment - 08/05/15 1448    Subjective Patient states she fell in her bedroom on Saturday, falling backwards while trying to bend over and pick up a paper without using rollator for support; patient states she fell on her buttocks and hit her head on a chair. Patient states she had a fall on Sunday falling backwards when attempting to climb stairs  into her home with husband assisting. Patient states her legs gave out under her. Patient states she fell onto buttocks and hit her head on the concrete. Patient fell again in same manner in same location on Monday. Patient states there are no rails on the stairs, and her husband was beside her when she fell. Patient reports no new headaches or pain but some soreness and does complain of dizziness and increased unsteadiness.   Pertinent History Uncontrolled HTN   Limitations Other (comment)  Doctor's orders: Patient may continue therapy as long as BP < 200/110.   Patient Stated Goals Walk without blood pressure getting too high   Currently in Pain? No/denies             PT  Education - 08/06/15 0819    Education provided Yes   Education Details fall prevention strategies;reasoning for getting checked out due to falls with hitting head   Person(s) Educated Patient;Spouse   Methods Explanation;Demonstration;Handout;Verbal cues   Comprehension Verbalized understanding;Returned demonstration;Need further instruction  needs continued education on fall prevention          PT Short Term Goals - 07/31/15 1548    PT SHORT TERM GOAL #1   Title Pt will verbalize fall prevention strategies to decrease falls risk. Target date: 07/31/15   Status On-going   PT SHORT TERM GOAL #2   Title Pt will improve gait speed to >/=1.39f/sec with LRAD to decrease falls risk. Target date: 07/31/15   Baseline Current as of 07/31/15 gait speed 1.136fsec Rollator   Status On-going   PT SHORT TERM GOAL #3   Title Perform BERG and write goal if appropriate. Target date: 07/31/15   Status Achieved   PT SHORT TERM GOAL #4   Title Pt will amb. 300' with LRAD over even terrain at MOD I level to improve functional mobillty. Target date: 07/31/15   Baseline Pt amb 325' with Rollator on indoor, level surfaces MOD I.    Status Achieved   PT SHORT TERM GOAL #5   Title Pt will improve BERG score to 40/56 to decr. falls risk. Target date: 07/31/15   Status New           PT Long Term Goals - 07/17/15 1531    PT LONG TERM GOAL #1   Title Pt will improve gait speed to >/=1.82f42fec with LRAD to reduce falls risk. Target date: 08/28/15   Status On-going   PT LONG TERM GOAL #2   Title Pt will amb. 500' over even/paved surfaces with LRAD at MOD I level to improve functional mobility. Target date: 08/28/15   Status On-going   PT LONG TERM GOAL #3   Title Pt will improve TUG time with LRAD to </=20 seconds to reduce falls risk.Target date: 08/28/15   Status On-going   PT LONG TERM GOAL #4   Title Pt will report zero falls over the last 2 weeks to improve safety during functional mobilty. Target  date: 08/28/15   Status On-going   PT LONG TERM GOAL #5   Title Pt will ascend/descend 4 steps with HHA in order to safely traverse steps at home. Target date: 08/28/15   Status On-going   Additional Long Term Goals   Additional Long Term Goals Yes   PT LONG TERM GOAL #6   Title Pt will improve BERG score to >/=44/56 to decr. falls risk. Target date: 08/28/15   Status New            Plan -  08/05/15 1523    Clinical Impression Statement Primary PT not in clinic today, therefore notified PT on site, [facility], PT. Was agreed that pt should have follow up with MD after falls with reported hitting of head and resulting dizziness/"funny feeling". Call placed to Dr. Carlis Abbott who recommended pt go to ED and get checked out there. Pt in agreement, spouse in lobby was notified by this PTA. At this time pt's spouse reported that pt actually did not fall, that he lowered her down to ground both times at the stairs due to legs's giving out and that she did not hit her head either time. He did not witness her fall in the bedroom, however found her sitting in the middle of the floor. He was agreeable to taking her to the ED if this is what she wants to do. Amy notiified of MD and pt's responses to events being reported. Session focused on fall prevention education and on possible effects of hitting her head, why she should be checked out.  Pt reports that putting up of bil hand rails on stairs is limited by finances at this time. Will give pt information on MS society for possible assistance with this next session.                                                                   Pt will benefit from skilled therapeutic intervention in order to improve on the following deficits Abnormal gait;Decreased endurance;Cardiopulmonary status limiting activity;Obesity;Pain;Postural dysfunction;Decreased coordination;Decreased mobility;Decreased balance;Decreased strength;Impaired sensation   Rehab Potential Good    Clinical Impairments Affecting Rehab Potential Uncontrolled HTN, COPD, CAD, CHF, MS (per pt report), asthma, LBP, arthritis, hypothyroidism   PT Frequency 2x / week   PT Duration 8 weeks   PT Treatment/Interventions ADLs/Self Care Home Management;Biofeedback;Manual techniques;Therapeutic exercise;Balance training;Therapeutic activities;Functional mobility training;Stair training;Gait training;DME Instruction;Neuromuscular re-education;Patient/family education;Orthotic Fit/Training   PT Next Visit Plan assess STGs not met if pt good to continue therapy. give pt/spouse information on MS society for possible help with hand rails on stairs/ramp for safety with entering/exiting home.   PT Home Exercise Plan Reviewed strengthening HEP; need to review balance.   Consulted and Agree with Plan of Care Patient   Family Member Consulted pt's husband-Johnny        Problem List Patient Active Problem List   Diagnosis Date Noted  . Cold feet 05/07/2014  . Cough 02/28/2014  . 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 Chronic diastolic HF (heart failure) ([place]) 10/17/2012  . HTN (hypertension) 10/17/2012  . H/O cardiac catheterization, 05/2012 with normal coronary arteries 10/17/2012  . Dysphagia 05/13/2012  . Pneumonia 05/11/2012  . Leukocytosis 05/10/2012  . NSTEMI, Type 2- Troponin 1.4 05/09/2012  . Goiter, chronic 05/09/2012  . Hypertensive hypertrophic cardiomyopathy, by echo 05/07/12 05/08/2012  . Acute respiratory failure with hypoxia ([place]) 05/06/2012  . Acute diastolic CHF (congestive heart failure) ([place]) 05/06/2012  . Morbid obesity ([place]) 05/06/2012  . Normal coronary arteries by cath 2008 & 12/ 2013; Normal EF;  LVH on Echo   05/06/2012  . Acute on chronic diastolic HF (heart failure) ([place]) 05/06/2012  . HTN (hypertension), malignant 05/06/2012  . Community acquired pneumonia 10/20/2011  . Uncontrolled hypertension 10/20/2011  . Diabetes mellitus type 2, uncontrolled, with complications  (  [place]) 10/20/2011  . Hyperlipidemia 10/20/2011  . Multiple sclerosis ([place]) 10/20/2011  . Abnormality of gait 04/21/2011  . Pain in limb 04/21/2011  . Encounter for therapeutic drug monitoring 04/21/2011    Willow Ora 08/06/2015, 8:20 AM  Willow Ora, PTA, [place] [address] [place], [place] [address] [phone] 08/06/2015, 8:20 AM   Name: NASHIKA COKER MRN: [id number] Date of Birth: [birth year]

## 2015-08-07 ENCOUNTER — Ambulatory Visit: Payer: BLUE CROSS/BLUE SHIELD

## 2015-08-07 DIAGNOSIS — R269 Unspecified abnormalities of gait and mobility: Secondary | ICD-10-CM

## 2015-08-07 NOTE — Therapy (Signed)
[facility] [address] [place], [place], [address] Phone: [phone]   Fax:  [phone]  Physical Therapy Treatment  Patient Details  Name: [name] Fry MRN: [id number] Date of Birth: 01-29-1951 Referring Provider: Butch Penny, NP  Encounter Date: 08/07/2015      PT End of Session - 08/07/15 1456    Visit Number 6   Number of Visits 17   Date for PT Re-Evaluation 09/01/15   Authorization Type U[employer]   PT Start Time 1447   PT Stop Time 1452   PT Time Calculation (min) 5 min      Past Medical History  Diagnosis Date  . Diabetes mellitus   . Hypertension 05/07/2012    Normal 2D Echo; Renal Arterial Doppler 02/02/2007 - less than 60% diameter reduction, left proximal renal artery  . Asthma   . COPD (chronic obstructive pulmonary disease) (HCC)   . Multiple sclerosis (HCC)   . Goiter   . Hypertensive hypertrophic cardiomyopathy, by echo 05/07/12 05/08/2012  . Dyspnea 12/12/2005    CHF and Unspecified Chest pain - Myocardial Perfusion - post stress EF-72, negative for ischemia  . Hx of cardiac catheterization     normal cardiac cath  . Chronic diastolic HF (heart failure) (HCC) 10/17/2012  . H/O cardiac catheterization 10/17/2012  . Hypothyroidism   . Depression   . Dyslipidemia   . Chronic low back pain   . Peripheral edema   . Neurogenic bladder   . Obesity   . Gait disorder   . GERD (gastroesophageal reflux disease)   . Arthritis   . Complication of anesthesia     HAS ASTHMA FLARES AFTER MOST SURGERIES  . Pneumonia DEC 2013    Past Surgical History  Procedure Laterality Date  . Cardiac catheterization Left 05/06/2012    Moderate caliber vessel from mid LAD  . Cardiac catheterization Right 04/02/2007    Left ventricular hypertrophy, no coronary disease, no renal artery stenosis  . Cesarean section       3x -[id number] AND 1985  . Cholecystectomy      1973  . Breast surgery      2 tumors removed  -right breast  . Appendectomy      1973  . Arthoscopic rotaor cuff repair Right 1995  . Joint replacement       right shoulder 1985   . Thyroidectomy N/A 11/01/2013    Procedure: THYROIDECTOMY;  Surgeon: Adolph Pollack, MD;  Location: [facility];  Service: General;  Laterality: N/A;  . Left heart catheterization with coronary angiogram N/A 05/07/2012    Procedure: LEFT HEART CATHETERIZATION WITH CORONARY ANGIOGRAM;  Surgeon: Marykay Lex, MD;  Location: [facility] CATH LAB;  Service: Cardiovascular;  Laterality: N/A;    There were no vitals filed for this visit.  Visit Diagnosis:  Abnormality of gait      No charge for visit as pt reported she has not gone to ED to have head examined after falls. Pt stated she still feels off balance and dizzy after falls. Pt reported head pain is decreasing.                        PT Short Term Goals - 07/31/15 1548    PT SHORT TERM GOAL #1   Title Pt will verbalize fall prevention strategies to decrease falls risk. Target date: 07/31/15   Status On-going   PT SHORT TERM GOAL #2   Title Pt will  improve gait speed to >/=1.32ft/sec wi51fh LRAD to decrease falls risk. Target date: 07/31/15   Baseline Current as of 07/31/15 gait speed 1.73ft/sec Rollator   Status On-going   PT SHORT TERM GOAL #3   Title Perform BERG and write goal if appropriate. Target date: 07/31/15   Status Achieved   PT SHORT TERM GOAL #4   Title Pt will amb. 300' with LRAD over even terrain at MOD I level to improve functional mobillty. Target date: 07/31/15   Baseline Pt amb 325' with Rollator on indoor, level surfaces MOD I.    Status Achieved   PT SHORT TERM GOAL #5   Title Pt will improve BERG score to 40/56 to decr. falls risk. Target date: 07/31/15   Status New           PT Long Term Goals - 07/17/15 1531    PT LONG TERM GOAL #1   Title Pt will improve gait speed to >/=1.67ft/sec with LRAD to reduce falls risk. Target date: 08/28/15   Status On-going   PT  LONG TERM GOAL #2   Title Pt will amb. 500' over even/paved surfaces with LRAD at MOD I level to improve functional mobility. Target date: 08/28/15   Status On-going   PT LONG TERM GOAL #3   Title Pt will improve TUG time with LRAD to </=20 seconds to reduce falls risk.Target date: 08/28/15   Status On-going   PT LONG TERM GOAL #4   Title Pt will report zero falls over the last 2 weeks to improve safety during functional mobilty. Target date: 08/28/15   Status On-going   PT LONG TERM GOAL #5   Title Pt will ascend/descend 4 steps with HHA in order to safely traverse steps at home. Target date: 08/28/15   Status On-going   Additional Long Term Goals   Additional Long Term Goals Yes   PT LONG TERM GOAL #6   Title Pt will improve BERG score to >/=44/56 to decr. falls risk. Target date: 08/28/15   Status New               Plan - 08/07/15 1456    Clinical Impression Statement No charge today, as pt reported she did not go to ED (per MD advise) after 3 falls at home. Pt hit her head during each fall and stated she feels as though she scrambled her brain and feels dizzy. Pt reported she has an appt on 08/12/15 with Dr. Chestine Spore to assess for head injuries. PT educated pt that we need resume therapy order after MD exam. Pt and husband agreeable.         Problem List Patient Active Problem List   Diagnosis Date Noted  . Cold feet 05/07/2014  . Cough 02/28/2014  . 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 Chronic diastolic HF (heart failure) (HCC) 10/17/2012  . HTN (hypertension) 10/17/2012  . H/O cardiac catheterization, 05/2012 with normal coronary arteries 10/17/2012  . Dysphagia 05/13/2012  . Pneumonia 05/11/2012  . Leukocytosis 05/10/2012  . NSTEMI, Type 2- Troponin 1.4 05/09/2012  . Goiter, chronic 05/09/2012  . Hypertensive hypertrophic cardiomyopathy, by echo 05/07/12 05/08/2012  . Acute respiratory failure with hypoxia (HCC) 05/06/2012  . Acute diastolic CHF (congestive heart  failure) (HCC) 05/06/2012  . Morbid obesity (HCC) 05/06/2012  . Normal coronary arteries by cath 2008 & 12/ 2013; Normal EF;  LVH on Echo   05/06/2012  . Acute on chronic diastolic HF (heart failure) (HCC) 05/06/2012  . HTN (hypertension), malignant  05/06/2012  . Community acquired pneumonia 10/20/2011  . Uncontrolled hypertension 10/20/2011  . Diabetes mellitus type 2, uncontrolled, with complications (HCC) 10/20/2011  . Hyperlipidemia 10/20/2011  . Multiple sclerosis (HCC) 10/20/2011  . Abnormality of gait 04/21/2011  . Pain in limb 04/21/2011  . Encounter for therapeutic drug monitoring 04/21/2011    Marieli Rudy L 08/07/2015, 2:59 PM  [facility] [address] [place], [place], [address] Phone: [phone]   Fax:  [phone]  Name: TARRON OLSSON MRN: [id number] Date of Birth: May 16, 1951    Zerita Boers, PT,DPT 08/07/2015 2:59 PM Phone: [phone] Fax: [phone]

## 2015-08-10 ENCOUNTER — Ambulatory Visit: Payer: BLUE CROSS/BLUE SHIELD | Admitting: Physical Therapy

## 2015-08-13 ENCOUNTER — Ambulatory Visit: Payer: BLUE CROSS/BLUE SHIELD | Admitting: Physical Therapy

## 2015-08-17 ENCOUNTER — Ambulatory Visit: Payer: Commercial Managed Care - HMO | Admitting: Physical Therapy

## 2015-08-20 ENCOUNTER — Ambulatory Visit: Payer: Commercial Managed Care - HMO | Admitting: Physical Therapy

## 2015-08-26 ENCOUNTER — Ambulatory Visit: Payer: BLUE CROSS/BLUE SHIELD | Admitting: Physical Therapy

## 2015-08-26 VITALS — BP 155/97

## 2015-08-26 DIAGNOSIS — R269 Unspecified abnormalities of gait and mobility: Secondary | ICD-10-CM

## 2015-08-26 DIAGNOSIS — R2681 Unsteadiness on feet: Secondary | ICD-10-CM | POA: Diagnosis not present

## 2015-08-26 DIAGNOSIS — R29898 Other symptoms and signs involving the musculoskeletal system: Secondary | ICD-10-CM

## 2015-08-26 NOTE — Therapy (Signed)
[facility] [address] [place], [place], [address] Phone: [phone]   Fax:  [phone]  Physical Therapy Treatment/Recertification  Patient Details  Name: [name] Fry MRN: [id number] Date of Birth: 1950-08-29 Referring Provider: Ward Givens, NP  Encounter Date: 08/26/2015      PT End of Session - 08/26/15 1445    Visit Number 7   Number of Visits 17   Date for PT Re-Evaluation 09/23/15   Authorization Type UHC   PT Start Time 1007  pt arrived late   PT Stop Time 1442   PT Time Calculation (min) 27 min   Equipment Utilized During Treatment Gait belt   Activity Tolerance Patient limited by fatigue   Behavior During Therapy [facility] for tasks assessed/performed      Past Medical History  Diagnosis Date  . Diabetes mellitus   . Hypertension 05/07/2012    Normal 2D Echo; Renal Arterial Doppler 02/02/2007 - less than 60% diameter reduction, left proximal renal artery  . Asthma   . COPD (chronic obstructive pulmonary disease) ([place])   . Multiple sclerosis ([place])   . Goiter   . Hypertensive hypertrophic cardiomyopathy, by echo 05/07/12 05/08/2012  . Dyspnea 12/12/2005    CHF and Unspecified Chest pain - Myocardial Perfusion - post stress EF-72, negative for ischemia  . Hx of cardiac catheterization     normal cardiac cath  . Chronic diastolic HF (heart failure) ([place]) 10/17/2012  . H/O cardiac catheterization 10/17/2012  . Hypothyroidism   . Depression   . Dyslipidemia   . Chronic low back pain   . Peripheral edema   . Neurogenic bladder   . Obesity   . Gait disorder   . GERD (gastroesophageal reflux disease)   . Arthritis   . Complication of anesthesia     HAS ASTHMA FLARES AFTER MOST SURGERIES  . Pneumonia DEC 2013    Past Surgical History  Procedure Laterality Date  . Cardiac catheterization Left 05/06/2012    Moderate caliber vessel from mid LAD  . Cardiac catheterization Right 04/02/2007    Left  ventricular hypertrophy, no coronary disease, no renal artery stenosis  . Cesarean section       3x -[id number] AND 1985  . Cholecystectomy      1973  . Breast surgery      2 tumors removed -right breast  . Appendectomy      1973  . Arthoscopic rotaor cuff repair Right 1995  . Joint replacement       right shoulder 1985   . Thyroidectomy N/A 11/01/2013    Procedure: THYROIDECTOMY;  Surgeon: Odis Hollingshead, MD;  Location: [facility];  Service: General;  Laterality: N/A;  . Left heart catheterization with coronary angiogram N/A 05/07/2012    Procedure: LEFT HEART CATHETERIZATION WITH CORONARY ANGIOGRAM;  Surgeon: Leonie Man, MD;  Location: [facility] CATH LAB;  Service: Cardiovascular;  Laterality: N/A;    Filed Vitals:   08/26/15 1418  BP: 155/97    Visit Diagnosis:  Abnormality of gait - Plan: PT plan of care cert/re-cert  Unsteadiness - Plan: PT plan of care cert/re-cert  Weakness of both lower extremities - Plan: PT plan of care cert/re-cert      Subjective Assessment - 08/26/15 1418    Subjective Feeling better today; no falls since last session.     Currently in Pain? No/denies  [place] Adult PT Treatment/Exercise - 08/26/15 1423    Ambulation/Gait   Ambulation/Gait Assistance 5: Supervision   Ambulation Distance (Feet) 100 Feet   Assistive device Rollator   Gait Pattern Step-through pattern;Decreased stride length;Decreased dorsiflexion - left;Shuffle;Trunk flexed   Ambulation Surface Level;Indoor   Gait velocity 1.34 ft/sec  24.47 sec   Standardized Balance Assessment   Standardized Balance Assessment Berg Balance Test;Timed Up and Go Test   Berg Balance Test   Sit to Stand Able to stand  independently using hands   Standing Unsupported Able to stand safely 2 minutes   Sitting with Back Unsupported but Feet Supported on Floor or Stool Able to sit safely and securely 2 minutes   Stand to Sit Sits safely with minimal use of hands    Transfers Able to transfer safely, minor use of hands   Standing Unsupported with Eyes Closed Able to stand 10 seconds with supervision   Standing Ubsupported with Feet Together Able to place feet together independently and stand 1 minute safely   From Standing, Reach Forward with Outstretched Arm Reaches forward but needs supervision   From Standing Position, Pick up Object from Floor Able to pick up shoe, needs supervision   From Standing Position, Turn to Look Behind Over each Shoulder Turn sideways only but maintains balance   Turn 360 Degrees Needs assistance while turning   Standing Unsupported, Alternately Place Feet on Step/Stool Needs assistance to keep from falling or unable to try   Standing Unsupported, One Foot in Front Needs help to step but can hold 15 seconds   Standing on One Leg Tries to lift leg/unable to hold 3 seconds but remains standing independently   Total Score 34   Timed Up and Go Test   TUG Normal TUG   Normal TUG (seconds) 26.75   Knee/Hip Exercises: Seated   Long Arc Quad Both;10 reps   Marching Limitations x10 bil                  PT Short Term Goals - 08/26/15 1446    PT SHORT TERM GOAL #1   Title Pt will verbalize fall prevention strategies to decrease falls risk. Target date: 09/23/15   Status On-going   PT SHORT TERM GOAL #2   Title Pt will improve gait speed to >/=1.68f/sec with LRAD to decrease falls risk. Target date: 07/31/15   Status Achieved   PT SHORT TERM GOAL #3   Title Perform BERG and write goal if appropriate. Target date: 07/31/15   Status Achieved   PT SHORT TERM GOAL #4   Title Pt will amb. 300' with LRAD over even terrain at MOD I level to improve functional mobillty. Target date: 07/31/15   Status Achieved   PT SHORT TERM GOAL #5   Title Pt will improve BERG score to 40/56 to decr. falls risk. Target date: 09/23/15   Status On-going           PT Long Term Goals - 08/26/15 1447    PT LONG TERM GOAL #1   Title Pt  will improve gait speed to >/=1.865fsec with LRAD to reduce falls risk. Target date: 09/23/15   Status Revised   PT LONG TERM GOAL #2   Title Pt will amb. 500' over even/paved surfaces with LRAD at MOD I level to improve functional mobility. Target date: 09/23/15   Status Revised   PT LONG TERM GOAL #3   Title Pt will improve TUG time with LRAD to </=20 seconds  to reduce falls risk.Target date: 09/23/15   Status Revised   PT LONG TERM GOAL #4   Title Pt will report zero falls over the last 2 weeks to improve safety during functional mobilty. Target date: 08/28/15   Status Achieved   PT LONG TERM GOAL #5   Title Pt will ascend/descend 4 steps with HHA in order to safely traverse steps at home. Target date: 09/23/15   Status Revised   PT LONG TERM GOAL #6   Title Pt will improve BERG score to >/=44/56 to decr. falls risk. Target date:09/23/15   Status Revised               Plan - 08/26/15 1448    Clinical Impression Statement Pt returns to OPPT after 2 weeks off since fall and MD requested hold PT x 2 weeks.  Pt has met one additional STG and no LTGs.  BERG decreased from 36/56 to 34/56 likely due to break from PT.  At this time recommend renewal of PT x 4 week to address continued deficits.  Gait velocity improved today.  Will continue to benefit from PT to maximize function and decrease fall risk.   Pt will benefit from skilled therapeutic intervention in order to improve on the following deficits Abnormal gait;Decreased endurance;Cardiopulmonary status limiting activity;Obesity;Pain;Postural dysfunction;Decreased coordination;Decreased mobility;Decreased balance;Decreased strength;Impaired sensation   Rehab Potential Good   Clinical Impairments Affecting Rehab Potential Uncontrolled HTN, COPD, CAD, CHF, MS (per pt report), asthma, LBP, arthritis, hypothyroidism   PT Frequency 2x / week   PT Duration 4 weeks   PT Treatment/Interventions ADLs/Self Care Home Management;Biofeedback;Manual  techniques;Therapeutic exercise;Balance training;Therapeutic activities;Functional mobility training;Stair training;Gait training;DME Instruction;Neuromuscular re-education;Patient/family education;Orthotic Fit/Training   PT Next Visit Plan  give pt/spouse information on MS society for possible help with hand rails on stairs/ramp for safety with entering/exiting home.   PT Home Exercise Plan Reviewed strengthening HEP; need to review balance.   Consulted and Agree with Plan of Care Patient        Problem List Patient Active Problem List   Diagnosis Date Noted  . Cold feet 05/07/2014  . Cough 02/28/2014  . 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 Chronic diastolic HF (heart failure) ([place]) 10/17/2012  . HTN (hypertension) 10/17/2012  . H/O cardiac catheterization, 05/2012 with normal coronary arteries 10/17/2012  . Dysphagia 05/13/2012  . Pneumonia 05/11/2012  . Leukocytosis 05/10/2012  . NSTEMI, Type 2- Troponin 1.4 05/09/2012  . Goiter, chronic 05/09/2012  . Hypertensive hypertrophic cardiomyopathy, by echo 05/07/12 05/08/2012  . Acute respiratory failure with hypoxia ([place]) 05/06/2012  . Acute diastolic CHF (congestive heart failure) ([place]) 05/06/2012  . Morbid obesity ([place]) 05/06/2012  . Normal coronary arteries by cath 2008 & 12/ 2013; Normal EF;  LVH on Echo   05/06/2012  . Acute on chronic diastolic HF (heart failure) ([place]) 05/06/2012  . HTN (hypertension), malignant 05/06/2012  . Community acquired pneumonia 10/20/2011  . Uncontrolled hypertension 10/20/2011  . Diabetes mellitus type 2, uncontrolled, with complications ([place]) 28/36/6294  . Hyperlipidemia 10/20/2011  . Multiple sclerosis ([place]) 10/20/2011  . Abnormality of gait 04/21/2011  . Pain in limb 04/21/2011  . Encounter for therapeutic drug monitoring 04/21/2011   Laureen Abrahams, PT, DPT 08/26/2015 2:52 PM  [facility] [address] [place], [place], [address] Phone: [phone]   Fax:  [phone]  Name: [name] Fry MRN: [id number] Date of Birth: 1950/07/19

## 2015-08-27 ENCOUNTER — Ambulatory Visit: Payer: Commercial Managed Care - HMO | Admitting: Pharmacist Clinician (PhC)/ Clinical Pharmacy Specialist

## 2015-08-31 ENCOUNTER — Ambulatory Visit: Payer: BLUE CROSS/BLUE SHIELD | Admitting: Physical Therapy

## 2015-08-31 ENCOUNTER — Encounter: Payer: Self-pay | Admitting: Physical Therapy

## 2015-08-31 VITALS — BP 168/110

## 2015-08-31 DIAGNOSIS — R2681 Unsteadiness on feet: Secondary | ICD-10-CM

## 2015-08-31 DIAGNOSIS — R269 Unspecified abnormalities of gait and mobility: Secondary | ICD-10-CM

## 2015-08-31 DIAGNOSIS — R29898 Other symptoms and signs involving the musculoskeletal system: Secondary | ICD-10-CM

## 2015-08-31 NOTE — Therapy (Signed)
[facility] [address] [place], [place], [address] Phone: [phone]   Fax:  [phone]  Physical Therapy Treatment  Patient Details  Name: [name] Fry MRN: [id number] Date of Birth: 10/04/1950 Referring Provider: Butch Penny, NP  Encounter Date: 08/31/2015      PT End of Session - 08/31/15 1421    Visit Number 7  arrived, no charge due to BP   Number of Visits 17   Date for PT Re-Evaluation 09/23/15   Authorization Type UHC   Equipment Utilized During Treatment Gait belt   Activity Tolerance Patient limited by fatigue   Behavior During Therapy [facility] for tasks assessed/performed      Past Medical History  Diagnosis Date  . Diabetes mellitus   . Hypertension 05/07/2012    Normal 2D Echo; Renal Arterial Doppler 02/02/2007 - less than 60% diameter reduction, left proximal renal artery  . Asthma   . COPD (chronic obstructive pulmonary disease) (HCC)   . Multiple sclerosis (HCC)   . Goiter   . Hypertensive hypertrophic cardiomyopathy, by echo 05/07/12 05/08/2012  . Dyspnea 12/12/2005    CHF and Unspecified Chest pain - Myocardial Perfusion - post stress EF-72, negative for ischemia  . Hx of cardiac catheterization     normal cardiac cath  . Chronic diastolic HF (heart failure) (HCC) 10/17/2012  . H/O cardiac catheterization 10/17/2012  . Hypothyroidism   . Depression   . Dyslipidemia   . Chronic low back pain   . Peripheral edema   . Neurogenic bladder   . Obesity   . Gait disorder   . GERD (gastroesophageal reflux disease)   . Arthritis   . Complication of anesthesia     HAS ASTHMA FLARES AFTER MOST SURGERIES  . Pneumonia DEC 2013    Past Surgical History  Procedure Laterality Date  . Cardiac catheterization Left 05/06/2012    Moderate caliber vessel from mid LAD  . Cardiac catheterization Right 04/02/2007    Left ventricular hypertrophy, no coronary disease, no renal artery stenosis  . Cesarean  section       3x -[id number] AND 1985  . Cholecystectomy      1973  . Breast surgery      2 tumors removed -right breast  . Appendectomy      1973  . Arthoscopic rotaor cuff repair Right 1995  . Joint replacement       right shoulder 1985   . Thyroidectomy N/A 11/01/2013    Procedure: THYROIDECTOMY;  Surgeon: Adolph Pollack, MD;  Location: [facility];  Service: General;  Laterality: N/A;  . Left heart catheterization with coronary angiogram N/A 05/07/2012    Procedure: LEFT HEART CATHETERIZATION WITH CORONARY ANGIOGRAM;  Surgeon: Marykay Lex, MD;  Location: [facility] CATH LAB;  Service: Cardiovascular;  Laterality: N/A;    Filed Vitals:   08/31/15 1408  BP: 168/110    Visit Diagnosis:  Unsteadiness  Weakness of both lower extremities  Abnormality of gait      Subjective Assessment - 08/31/15 1408    Subjective Feeling better today; no falls since last session.   Currently in Pain? Yes   Pain Score 9    Pain Location Foot   Pain Orientation Right;Left;Distal;Anterior   Pain Descriptors / Indicators Pins and needles   Pain Type Chronic pain   Pain Onset More than a month ago   Pain Frequency Constant   Aggravating Factors  Worse at night   Pain Relieving Factors Medications/creme  PT Short Term Goals - 08/26/15 1446    PT SHORT TERM GOAL #1   Title Pt will verbalize fall prevention strategies to decrease falls risk. Target date: 09/23/15   Status On-going   PT SHORT TERM GOAL #2   Title Pt will improve gait speed to >/=1.63ft/sec with LRAD to decrease falls risk. Target date: 07/31/15   Status Achieved   PT SHORT TERM GOAL #3   Title Perform BERG and write goal if appropriate. Target date: 07/31/15   Status Achieved   PT SHORT TERM GOAL #4   Title Pt will amb. 300' with LRAD over even terrain at MOD I level to improve functional mobillty. Target date: 07/31/15   Status Achieved   PT SHORT TERM GOAL #5   Title Pt will improve BERG score to 40/56 to decr.  falls risk. Target date: 09/23/15   Status On-going           PT Long Term Goals - 08/26/15 1447    PT LONG TERM GOAL #1   Title Pt will improve gait speed to >/=1.58ft/sec with LRAD to reduce falls risk. Target date: 09/23/15   Status Revised   PT LONG TERM GOAL #2   Title Pt will amb. 500' over even/paved surfaces with LRAD at MOD I level to improve functional mobility. Target date: 09/23/15   Status Revised   PT LONG TERM GOAL #3   Title Pt will improve TUG time with LRAD to </=20 seconds to reduce falls risk.Target date: 09/23/15   Status Revised   PT LONG TERM GOAL #4   Title Pt will report zero falls over the last 2 weeks to improve safety during functional mobilty. Target date: 08/28/15   Status Achieved   PT LONG TERM GOAL #5   Title Pt will ascend/descend 4 steps with HHA in order to safely traverse steps at home. Target date: 09/23/15   Status Revised   PT LONG TERM GOAL #6   Title Pt will improve BERG score to >/=44/56 to decr. falls risk. Target date:09/23/15   Status Revised            Plan - 08/31/15 1422    Clinical Impression Statement No charge today as pt's BP was elevated above safe levels for therapy. Pt reported she took her BP meds this am, however she forgot to take the others she is suppossed to take prior to PT/exercises.    Pt will benefit from skilled therapeutic intervention in order to improve on the following deficits Abnormal gait;Decreased endurance;Cardiopulmonary status limiting activity;Obesity;Pain;Postural dysfunction;Decreased coordination;Decreased mobility;Decreased balance;Decreased strength;Impaired sensation   Rehab Potential Good   Clinical Impairments Affecting Rehab Potential Uncontrolled HTN, COPD, CAD, CHF, MS (per pt report), asthma, LBP, arthritis, hypothyroidism   PT Frequency 2x / week   PT Duration 4 weeks   PT Treatment/Interventions ADLs/Self Care Home Management;Biofeedback;Manual techniques;Therapeutic exercise;Balance  training;Therapeutic activities;Functional mobility training;Stair training;Gait training;DME Instruction;Neuromuscular re-education;Patient/family education;Orthotic Fit/Training   PT Next Visit Plan check BP; give pt/spouse information on MS society for possible help with hand rails on stairs/ramp for safety with entering/exiting home. review balance HEP, add as needed.   PT Home Exercise Plan Reviewed strengthening HEP; need to review balance.   Consulted and Agree with Plan of Care Patient        Problem List Patient Active Problem List   Diagnosis Date Noted  . Cold feet 05/07/2014  . Cough 02/28/2014  . 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 Chronic diastolic HF (heart failure) (HCC)  10/17/2012  . HTN (hypertension) 10/17/2012  . H/O cardiac catheterization, 05/2012 with normal coronary arteries 10/17/2012  . Dysphagia 05/13/2012  . Pneumonia 05/11/2012  . Leukocytosis 05/10/2012  . NSTEMI, Type 2- Troponin 1.4 05/09/2012  . Goiter, chronic 05/09/2012  . Hypertensive hypertrophic cardiomyopathy, by echo 05/07/12 05/08/2012  . Acute respiratory failure with hypoxia (HCC) 05/06/2012  . Acute diastolic CHF (congestive heart failure) (HCC) 05/06/2012  . Morbid obesity (HCC) 05/06/2012  . Normal coronary arteries by cath 2008 & 12/ 2013; Normal EF;  LVH on Echo   05/06/2012  . Acute on chronic diastolic HF (heart failure) (HCC) 05/06/2012  . HTN (hypertension), malignant 05/06/2012  . Community acquired pneumonia 10/20/2011  . Uncontrolled hypertension 10/20/2011  . Diabetes mellitus type 2, uncontrolled, with complications (HCC) 10/20/2011  . Hyperlipidemia 10/20/2011  . Multiple sclerosis (HCC) 10/20/2011  . Abnormality of gait 04/21/2011  . Pain in limb 04/21/2011  . Encounter for therapeutic drug monitoring 04/21/2011   Sallyanne Kuster, PTA, [facility] [address], [address] [place], [place] [address] [phone] 08/31/2015, 2:25 PM   Name:  [name] Fry MRN: [id number] Date of Birth: 1951-02-03

## 2015-09-01 ENCOUNTER — Ambulatory Visit: Payer: Commercial Managed Care - HMO | Admitting: Pharmacist Clinician (PhC)/ Clinical Pharmacy Specialist

## 2015-09-03 ENCOUNTER — Telehealth: Payer: Self-pay | Admitting: Cardiovascular Disease

## 2015-09-03 ENCOUNTER — Ambulatory Visit: Payer: BLUE CROSS/BLUE SHIELD | Admitting: Physical Therapy

## 2015-09-03 ENCOUNTER — Encounter: Payer: Self-pay | Admitting: Physical Therapy

## 2015-09-03 VITALS — BP 150/96 | HR 82

## 2015-09-03 DIAGNOSIS — R29898 Other symptoms and signs involving the musculoskeletal system: Secondary | ICD-10-CM

## 2015-09-03 DIAGNOSIS — R2681 Unsteadiness on feet: Secondary | ICD-10-CM

## 2015-09-03 NOTE — Therapy (Signed)
[facility] [address] [place], [place], [address] Phone: [phone]   Fax:  [phone]  Physical Therapy Treatment  Patient Details  Name: [name] Fry MRN: [id number] Date of Birth: [birth year] Referring Provider: Butch Penny, NP  Encounter Date: 09/03/2015      PT End of Session - 09/03/15 1436    Visit Number 7  arrived, no charge due to BP   Number of Visits 17   Date for PT Re-Evaluation 09/23/15   Authorization Type UHC   PT Start Time 1400   PT Stop Time 1435   PT Time Calculation (min) 35 min   Equipment Utilized During Treatment Gait belt   Activity Tolerance Patient limited by fatigue   Behavior During Therapy [facility] for tasks assessed/performed      Past Medical History  Diagnosis Date  . Diabetes mellitus   . Hypertension 05/07/2012    Normal 2D Echo; Renal Arterial Doppler 02/02/2007 - less than 60% diameter reduction, left proximal renal artery  . Asthma   . COPD (chronic obstructive pulmonary disease) (HCC)   . Multiple sclerosis (HCC)   . Goiter   . Hypertensive hypertrophic cardiomyopathy, by echo 05/07/12 05/08/2012  . Dyspnea 12/12/2005    CHF and Unspecified Chest pain - Myocardial Perfusion - post stress EF-72, negative for ischemia  . Hx of cardiac catheterization     normal cardiac cath  . Chronic diastolic HF (heart failure) (HCC) 10/17/2012  . H/O cardiac catheterization 10/17/2012  . Hypothyroidism   . Depression   . Dyslipidemia   . Chronic low back pain   . Peripheral edema   . Neurogenic bladder   . Obesity   . Gait disorder   . GERD (gastroesophageal reflux disease)   . Arthritis   . Complication of anesthesia     HAS ASTHMA FLARES AFTER MOST SURGERIES  . Pneumonia DEC 2013    Past Surgical History  Procedure Laterality Date  . Cardiac catheterization Left 05/06/2012    Moderate caliber vessel from mid LAD  . Cardiac catheterization Right 04/02/2007    Left  ventricular hypertrophy, no coronary disease, no renal artery stenosis  . Cesarean section       3x -[id number] AND 1985  . Cholecystectomy      1973  . Breast surgery      2 tumors removed -right breast  . Appendectomy      1973  . Arthoscopic rotaor cuff repair Right 1995  . Joint replacement       right shoulder 1985   . Thyroidectomy N/A 11/01/2013    Procedure: THYROIDECTOMY;  Surgeon: Adolph Pollack, MD;  Location: [facility];  Service: General;  Laterality: N/A;  . Left heart catheterization with coronary angiogram N/A 05/07/2012    Procedure: LEFT HEART CATHETERIZATION WITH CORONARY ANGIOGRAM;  Surgeon: Marykay Lex, MD;  Location: [facility] CATH LAB;  Service: Cardiovascular;  Laterality: N/A;    Filed Vitals:   09/03/15 1406 09/03/15 1438 09/03/15 1439  BP: 190/110 190/110 150/96  Pulse: 82    SpO2: 97%      Visit Diagnosis:  Unsteadiness  Weakness of both lower extremities      Subjective Assessment - 09/03/15 1408    Subjective "I took my BP med. a couple hrs before I came."   Currently in Pain? Yes   Pain Score 9    Pain Location Foot   Pain Orientation Right;Left;Anterior;Distal   Pain Descriptors / Indicators Pins and needles;Numbness  Pain Type Chronic pain   Pain Onset More than a month ago   Pain Frequency Constant                                   PT Short Term Goals - 08/26/15 1446    PT SHORT TERM GOAL #1   Title Pt will verbalize fall prevention strategies to decrease falls risk. Target date: 09/23/15   Status On-going   PT SHORT TERM GOAL #2   Title Pt will improve gait speed to >/=1.71ft/sec with LRAD to decrease falls risk. Target date: 07/31/15   Status Achieved   PT SHORT TERM GOAL #3   Title Perform BERG and write goal if appropriate. Target date: 07/31/15   Status Achieved   PT SHORT TERM GOAL #4   Title Pt will amb. 300' with LRAD over even terrain at MOD I level to improve functional mobillty. Target date: 07/31/15    Status Achieved   PT SHORT TERM GOAL #5   Title Pt will improve BERG score to 40/56 to decr. falls risk. Target date: 09/23/15   Status On-going           PT Long Term Goals - 08/26/15 1447    PT LONG TERM GOAL #1   Title Pt will improve gait speed to >/=1.82ft/sec with LRAD to reduce falls risk. Target date: 09/23/15   Status Revised   PT LONG TERM GOAL #2   Title Pt will amb. 500' over even/paved surfaces with LRAD at MOD I level to improve functional mobility. Target date: 09/23/15   Status Revised   PT LONG TERM GOAL #3   Title Pt will improve TUG time with LRAD to </=20 seconds to reduce falls risk.Target date: 09/23/15   Status Revised   PT LONG TERM GOAL #4   Title Pt will report zero falls over the last 2 weeks to improve safety during functional mobilty. Target date: 08/28/15   Status Achieved   PT LONG TERM GOAL #5   Title Pt will ascend/descend 4 steps with HHA in order to safely traverse steps at home. Target date: 09/23/15   Status Revised   PT LONG TERM GOAL #6   Title Pt will improve BERG score to >/=44/56 to decr. falls risk. Target date:09/23/15   Status Revised               Plan - 09/03/15 1440    Clinical Impression Statement No charge today due to BP being elevated. Pt did take her medication today. Notified Primary PT; who recommended holding therapy and calling cardiologist. Called cardiologist and left message.   Pt will benefit from skilled therapeutic intervention in order to improve on the following deficits Abnormal gait;Decreased endurance;Cardiopulmonary status limiting activity;Obesity;Pain;Postural dysfunction;Decreased coordination;Decreased mobility;Decreased balance;Decreased strength;Impaired sensation   Rehab Potential Good   Clinical Impairments Affecting Rehab Potential Uncontrolled HTN, COPD, CAD, CHF, MS (per pt report), asthma, LBP, arthritis, hypothyroidism   PT Frequency 2x / week   PT Duration 4 weeks   PT Treatment/Interventions  ADLs/Self Care Home Management;Biofeedback;Manual techniques;Therapeutic exercise;Balance training;Therapeutic activities;Functional mobility training;Stair training;Gait training;DME Instruction;Neuromuscular re-education;Patient/family education;Orthotic Fit/Training   PT Next Visit Plan check BP; give pt/spouse information on MS society for possible help with hand rails on stairs/ramp for safety with entering/exiting home. review balance HEP, add as needed.   PT Home Exercise Plan Reviewed strengthening HEP; need to review balance.   Consulted and Agree  with Plan of Care Patient        Problem List Patient Active Problem List   Diagnosis Date Noted  . Cold feet 05/07/2014  . Cough 02/28/2014  . 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 Chronic diastolic HF (heart failure) (HCC) 10/17/2012  . HTN (hypertension) 10/17/2012  . H/O cardiac catheterization, 05/2012 with normal coronary arteries 10/17/2012  . Dysphagia 05/13/2012  . Pneumonia 05/11/2012  . Leukocytosis 05/10/2012  . NSTEMI, Type 2- Troponin 1.4 05/09/2012  . Goiter, chronic 05/09/2012  . Hypertensive hypertrophic cardiomyopathy, by echo 05/07/12 05/08/2012  . Acute respiratory failure with hypoxia (HCC) 05/06/2012  . Acute diastolic CHF (congestive heart failure) (HCC) 05/06/2012  . Morbid obesity (HCC) 05/06/2012  . Normal coronary arteries by cath 2008 & 12/ 2013; Normal EF;  LVH on Echo   05/06/2012  . Acute on chronic diastolic HF (heart failure) (HCC) 05/06/2012  . HTN (hypertension), malignant 05/06/2012  . Community acquired pneumonia 10/20/2011  . Uncontrolled hypertension 10/20/2011  . Diabetes mellitus type 2, uncontrolled, with complications (HCC) 10/20/2011  . Hyperlipidemia 10/20/2011  . Multiple sclerosis (HCC) 10/20/2011  . Abnormality of gait 04/21/2011  . Pain in limb 04/21/2011  . Encounter for therapeutic drug monitoring 04/21/2011    [name] Fry, PTA  09/03/2015, 3:53 PM   [facility]  [facility] [address] [place], [place], [address] Phone: [phone]   Fax:  [phone]  Name: [name] Fry MRN: [id number] Date of Birth: 01-07-1951

## 2015-09-03 NOTE — Telephone Encounter (Signed)
Routed to PharmD as she has been managing her BP and medications

## 2015-09-03 NOTE — Telephone Encounter (Signed)
[name] Fry is the PT assistant calling stating that she has missed 3 or 4 sessions due to here BP being too high. Today when she came in it was 190/110 and after resting for a few minutes it came down to 150/94. Please f/u with Zerita Boers the lead PT on staff.

## 2015-09-03 NOTE — Telephone Encounter (Signed)
Spoke with PT, patient has been unable to get BP low enough for therapy.  She had appt last week with me, was moved to next Thursday.  Will continue to work with patient and therapy will continue to help as much as possible.

## 2015-09-09 ENCOUNTER — Ambulatory Visit: Payer: Commercial Managed Care - HMO | Admitting: Physical Therapy

## 2015-09-09 ENCOUNTER — Ambulatory Visit: Payer: BLUE CROSS/BLUE SHIELD | Attending: Adult Health | Admitting: Physical Therapy

## 2015-09-09 ENCOUNTER — Encounter: Payer: Self-pay | Admitting: Physical Therapy

## 2015-09-09 VITALS — BP 114/80 | HR 77

## 2015-09-09 DIAGNOSIS — R2681 Unsteadiness on feet: Secondary | ICD-10-CM

## 2015-09-09 DIAGNOSIS — R2689 Other abnormalities of gait and mobility: Secondary | ICD-10-CM | POA: Diagnosis present

## 2015-09-09 DIAGNOSIS — M6281 Muscle weakness (generalized): Secondary | ICD-10-CM | POA: Insufficient documentation

## 2015-09-09 NOTE — Patient Instructions (Signed)
Perform in corner with chair in front of you for safety OR perform at kitchen sink with chair behind you: only on days that your blood pressure is lower below 140/90.  Feet Apart, Varied Arm Positions - Eyes Open    Discontinue this one- perform the two listed below only. With eyes open, feet shoulder width apart, arms at your side, look straight ahead at a stationary object. Hold __30__ seconds. Repeat __3__ times per session. Do __1__ sessions per day.  Feet Together, Varied Arm Positions - Eyes Open    With eyes open, feet together, arms out, look straight ahead at a stationary object. Hold _30__ seconds. Repeat __3 times per session. Do __1-2_ sessions per day.  Copyright  VHI. All rights reserved.    Feet Apart, Varied Arm Positions - Eyes Closed    Stand with feet shoulder width apart and arms at your sides. Close eyes and visualize upright position. Hold __30__ seconds. Repeat __3__ times per session. Do _1-2___ sessions per day.  Copyright  VHI. All rights reserved.

## 2015-09-10 ENCOUNTER — Ambulatory Visit: Payer: Commercial Managed Care - HMO | Admitting: Pharmacist Clinician (PhC)/ Clinical Pharmacy Specialist

## 2015-09-11 ENCOUNTER — Encounter: Payer: Self-pay | Admitting: Physical Therapy

## 2015-09-11 ENCOUNTER — Ambulatory Visit: Payer: BLUE CROSS/BLUE SHIELD | Admitting: Physical Therapy

## 2015-09-11 VITALS — BP 158/92 | HR 73

## 2015-09-11 DIAGNOSIS — M6281 Muscle weakness (generalized): Secondary | ICD-10-CM

## 2015-09-11 DIAGNOSIS — R2689 Other abnormalities of gait and mobility: Secondary | ICD-10-CM

## 2015-09-11 DIAGNOSIS — R2681 Unsteadiness on feet: Secondary | ICD-10-CM | POA: Diagnosis not present

## 2015-09-11 NOTE — Therapy (Signed)
[facility] [address] [place], [place], [address] Phone: [phone]   Fax:  [phone]  Physical Therapy Treatment  Patient Details  Name: [name] Fry MRN: [id number] Date of Birth: 1951/04/06 Referring Provider: Butch Penny, NP  Encounter Date: 09/11/2015      PT End of Session - 09/11/15 1325    Visit Number 9   Number of Visits 17   Date for PT Re-Evaluation 09/23/15   Authorization Type U[employer]   PT Start Time 1317   PT Stop Time 1400   PT Time Calculation (min) 43 min   Equipment Utilized During Treatment Gait belt   Activity Tolerance Patient limited by fatigue   Behavior During Therapy [facility] for tasks assessed/performed      Past Medical History  Diagnosis Date  . Diabetes mellitus   . Hypertension 05/07/2012    Normal 2D Echo; Renal Arterial Doppler 02/02/2007 - less than 60% diameter reduction, left proximal renal artery  . Asthma   . COPD (chronic obstructive pulmonary disease) (HCC)   . Multiple sclerosis (HCC)   . Goiter   . Hypertensive hypertrophic cardiomyopathy, by echo 05/07/12 05/08/2012  . Dyspnea 12/12/2005    CHF and Unspecified Chest pain - Myocardial Perfusion - post stress EF-72, negative for ischemia  . Hx of cardiac catheterization     normal cardiac cath  . Chronic diastolic HF (heart failure) (HCC) 10/17/2012  . H/O cardiac catheterization 10/17/2012  . Hypothyroidism   . Depression   . Dyslipidemia   . Chronic low back pain   . Peripheral edema   . Neurogenic bladder   . Obesity   . Gait disorder   . GERD (gastroesophageal reflux disease)   . Arthritis   . Complication of anesthesia     HAS ASTHMA FLARES AFTER MOST SURGERIES  . Pneumonia DEC 2013    Past Surgical History  Procedure Laterality Date  . Cardiac catheterization Left 05/06/2012    Moderate caliber vessel from mid LAD  . Cardiac catheterization Right 04/02/2007    Left ventricular hypertrophy, no coronary  disease, no renal artery stenosis  . Cesarean section       3x -[id number] AND 1985  . Cholecystectomy      1973  . Breast surgery      2 tumors removed -right breast  . Appendectomy      1973  . Arthoscopic rotaor cuff repair Right 1995  . Joint replacement       right shoulder 1985   . Thyroidectomy N/A 11/01/2013    Procedure: THYROIDECTOMY;  Surgeon: Adolph Pollack, MD;  Location: [facility];  Service: General;  Laterality: N/A;  . Left heart catheterization with coronary angiogram N/A 05/07/2012    Procedure: LEFT HEART CATHETERIZATION WITH CORONARY ANGIOGRAM;  Surgeon: Marykay Lex, MD;  Location: [facility] CATH LAB;  Service: Cardiovascular;  Laterality: N/A;    Filed Vitals:   09/11/15 1321 09/11/15 1340 09/11/15 1344 09/11/15 1352  BP: 128/91 before session 181/112 after 2cd gait trial with automated cuff 180/98 - manual recheck after automated cuff reading 158/92- after 5 minutes seated rest break  Pulse: 73           Subjective Assessment - 09/11/15 1321    Subjective No new complaints. No new falls. Denies any pain other that foot pain. Reports she did take her medication today. See's Dr Chestine Spore Monday for f/u and    Patient is accompained by: Family member  spouse in  lobby   Pertinent History Uncontrolled HTN- monitor BP (md orders- pt may continue therapy as long as BP < 200/110   Limitations Other (comment)  doctors orders: pt may continue PT as long as BP is <200/110   Patient Stated Goals Walk without blood pressure getting too high   Currently in Pain? Yes   Pain Score 8    Pain Location Foot   Pain Orientation Right;Left   Pain Descriptors / Indicators Numbness;Pins and needles   Pain Type Chronic pain   Pain Onset More than a month ago   Pain Frequency Constant   Aggravating Factors  worse at night, cold weather   Pain Relieving Factors medications/creame             [facility] Adult PT Treatment/Exercise - 09/11/15 1328    Transfers   Transfers Sit to  Stand;Stand to Sit   Sit to Stand 5: Supervision;With upper extremity assist;From bed;With armrests   Stand to Sit 5: Supervision;With upper extremity assist;With armrests;To chair/3-in-1   Ambulation/Gait   Ambulation/Gait Yes   Ambulation/Gait Assistance 5: Supervision;4: Min guard   Ambulation/Gait Assistance Details cues on posture and for increased step length and foot clearance with gait   Ambulation Distance (Feet) 210 Feet  x2   Assistive device Rollator   Gait Pattern Step-through pattern;Decreased stride length;Decreased dorsiflexion - left;Shuffle;Trunk flexed   Ambulation Surface Level;Indoor   Knee/Hip Exercises: Standing   Heel Raises Both;1 set;15 reps;Limitations   Heel Raises Limitations UE support on locked rollator, cues on posture and ex form   Knee Flexion AROM;Strengthening;Both;1 set;10 reps;Limitations   Knee Flexion Limitations locked rollator support, alternating legs, cues on posture and ex form   Functional Squat 1 set;10 reps;Limitations  mini squat   Functional Squat Limitations with locked rollator support: cues on form and posture            PT Short Term Goals - 08/26/15 1446    PT SHORT TERM GOAL #1   Title Pt will verbalize fall prevention strategies to decrease falls risk. Target date: 09/23/15   Status On-going   PT SHORT TERM GOAL #2   Title Pt will improve gait speed to >/=1.27ft/sec with LRAD to decrease falls risk. Target date: 07/31/15   Status Achieved   PT SHORT TERM GOAL #3   Title Perform BERG and write goal if appropriate. Target date: 07/31/15   Status Achieved   PT SHORT TERM GOAL #4   Title Pt will amb. 300' with LRAD over even terrain at MOD I level to improve functional mobillty. Target date: 07/31/15   Status Achieved   PT SHORT TERM GOAL #5   Title Pt will improve BERG score to 40/56 to decr. falls risk. Target date: 09/23/15   Status On-going           PT Long Term Goals - 08/26/15 1447    PT LONG TERM GOAL #1    Title Pt will improve gait speed to >/=1.76ft/sec with LRAD to reduce falls risk. Target date: 09/23/15   Status Revised   PT LONG TERM GOAL #2   Title Pt will amb. 500' over even/paved surfaces with LRAD at MOD I level to improve functional mobility. Target date: 09/23/15   Status Revised   PT LONG TERM GOAL #3   Title Pt will improve TUG time with LRAD to </=20 seconds to reduce falls risk.Target date: 09/23/15   Status Revised   PT LONG TERM GOAL #4   Title Pt will  report zero falls over the last 2 weeks to improve safety during functional mobilty. Target date: 08/28/15   Status Achieved   PT LONG TERM GOAL #5   Title Pt will ascend/descend 4 steps with HHA in order to safely traverse steps at home. Target date: 09/23/15   Status Revised   PT LONG TERM GOAL #6   Title Pt will improve BERG score to >/=44/56 to decr. falls risk. Target date:09/23/15   Status Revised            Plan - 09/11/15 1326    Clinical Impression Statement Today's skilled session focused on gait, strengthening and increased activity tolerance. Pt's BP did raise with gait, howver decreased with seated rest break. Pt is making steady progress toward goals.   Rehab Potential Good   Clinical Impairments Affecting Rehab Potential Uncontrolled HTN, COPD, CAD, CHF, MS (per pt report), asthma, LBP, arthritis, hypothyroidism   PT Frequency 2x / week   PT Duration 4 weeks   PT Treatment/Interventions ADLs/Self Care Home Management;Biofeedback;Manual techniques;Therapeutic exercise;Balance training;Therapeutic activities;Functional mobility training;Stair training;Gait training;DME Instruction;Neuromuscular re-education;Patient/family education;Orthotic Fit/Training   PT Next Visit Plan check BP; continue toward STGs   PT Home Exercise Plan Reviewed strengthening HEP; need to review balance.   Consulted and Agree with Plan of Care Patient      Patient will benefit from skilled therapeutic intervention in order to improve  the following deficits and impairments:  Abnormal gait, Decreased endurance, Cardiopulmonary status limiting activity, Obesity, Pain, Postural dysfunction, Decreased coordination, Decreased mobility, Decreased balance, Decreased strength, Impaired sensation  Visit Diagnosis: Unsteadiness on feet  Muscle weakness (generalized)  Other abnormalities of gait and mobility     Problem List Patient Active Problem List   Diagnosis Date Noted  . Cold feet 05/07/2014  . Cough 02/28/2014  . 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 Chronic diastolic HF (heart failure) (HCC) 10/17/2012  . HTN (hypertension) 10/17/2012  . H/O cardiac catheterization, 05/2012 with normal coronary arteries 10/17/2012  . Dysphagia 05/13/2012  . Pneumonia 05/11/2012  . Leukocytosis 05/10/2012  . NSTEMI, Type 2- Troponin 1.4 05/09/2012  . Goiter, chronic 05/09/2012  . Hypertensive hypertrophic cardiomyopathy, by echo 05/07/12 05/08/2012  . Acute respiratory failure with hypoxia (HCC) 05/06/2012  . Acute diastolic CHF (congestive heart failure) (HCC) 05/06/2012  . Morbid obesity (HCC) 05/06/2012  . Normal coronary arteries by cath 2008 & 12/ 2013; Normal EF;  LVH on Echo   05/06/2012  . Acute on chronic diastolic HF (heart failure) (HCC) 05/06/2012  . HTN (hypertension), malignant 05/06/2012  . Community acquired pneumonia 10/20/2011  . Uncontrolled hypertension 10/20/2011  . Diabetes mellitus type 2, uncontrolled, with complications (HCC) 10/20/2011  . Hyperlipidemia 10/20/2011  . Multiple sclerosis (HCC) 10/20/2011  . Abnormality of gait 04/21/2011  . Pain in limb 04/21/2011  . Encounter for therapeutic drug monitoring 04/21/2011    Sallyanne Kuster, PTA, [facility] [address] [place], [place] [address] [phone] 09/11/2015, 2:04 PM   Name: [name] Fry MRN: [id number] Date of Birth: Jun 04, 1951

## 2015-09-11 NOTE — Therapy (Signed)
[facility] [address] [place], [place], [address] Phone: [phone]   Fax:  [phone]  Physical Therapy Treatment  Patient Details  Name: [name] Fry MRN: [id number] Date of Birth: 11-23-1950 Referring Provider: Butch Penny, NP  Encounter Date: 09/09/2015   09/09/15 1455  PT Visits / Re-Eval  Visit Number 8  Number of Visits 17  Date for PT Re-Evaluation 09/23/15  Authorization  Authorization Type UHC  PT Time Calculation  PT Start Time 1447  PT Stop Time 1530  PT Time Calculation (min) 43 min  PT - End of Session  Equipment Utilized During Treatment Gait belt  Activity Tolerance Patient limited by fatigue  Behavior During Therapy [facility] for tasks assessed/performed     Past Medical History  Diagnosis Date  . Diabetes mellitus   . Hypertension 05/07/2012    Normal 2D Echo; Renal Arterial Doppler 02/02/2007 - less than 60% diameter reduction, left proximal renal artery  . Asthma   . COPD (chronic obstructive pulmonary disease) (HCC)   . Multiple sclerosis (HCC)   . Goiter   . Hypertensive hypertrophic cardiomyopathy, by echo 05/07/12 05/08/2012  . Dyspnea 12/12/2005    CHF and Unspecified Chest pain - Myocardial Perfusion - post stress EF-72, negative for ischemia  . Hx of cardiac catheterization     normal cardiac cath  . Chronic diastolic HF (heart failure) (HCC) 10/17/2012  . H/O cardiac catheterization 10/17/2012  . Hypothyroidism   . Depression   . Dyslipidemia   . Chronic low back pain   . Peripheral edema   . Neurogenic bladder   . Obesity   . Gait disorder   . GERD (gastroesophageal reflux disease)   . Arthritis   . Complication of anesthesia     HAS ASTHMA FLARES AFTER MOST SURGERIES  . Pneumonia DEC 2013    Past Surgical History  Procedure Laterality Date  . Cardiac catheterization Left 05/06/2012    Moderate caliber vessel from mid LAD  . Cardiac catheterization Right 04/02/2007     Left ventricular hypertrophy, no coronary disease, no renal artery stenosis  . Cesarean section       3x -[id number] AND 1985  . Cholecystectomy      1973  . Breast surgery      2 tumors removed -right breast  . Appendectomy      1973  . Arthoscopic rotaor cuff repair Right 1995  . Joint replacement       right shoulder 1985   . Thyroidectomy N/A 11/01/2013    Procedure: THYROIDECTOMY;  Surgeon: Adolph Pollack, MD;  Location: [facility];  Service: General;  Laterality: N/A;  . Left heart catheterization with coronary angiogram N/A 05/07/2012    Procedure: LEFT HEART CATHETERIZATION WITH CORONARY ANGIOGRAM;  Surgeon: Marykay Lex, MD;  Location: [facility] CATH LAB;  Service: Cardiovascular;  Laterality: N/A;    Filed Vitals:   09/09/15 1452 09/09/15 1518  BP: 134/93 114/80  Pulse: 88 77     09/09/15 1452  Symptoms/Limitations  Subjective No new complaints. No new falls. Denies any pain other that foot pain. Reports she did take her medication today.  Patient is accompained by: Family member (spouse in lobby)  Pertinent History Uncontrolled HTN- monitor BP (md orders- pt may continue therapy as long as BP < 200/110  Patient Stated Goals Walk without blood pressure getting too high  Pain Assessment  Currently in Pain? Yes  Pain Score 8  Pain Location Foot  Pain  Orientation Right;Left  Pain Descriptors / Indicators Pins and needles;Numbness  Pain Type Chronic pain  Pain Onset More than a month ago  Pain Frequency Constant  Aggravating Factors  worse at night, cold weather  Pain Relieving Factors medication/creame      09/09/15 1524  Transfers  Transfers Sit to Stand;Stand to Sit  Sit to Stand 5: Supervision;With upper extremity assist;From bed;With armrests  Stand to Sit 5: Supervision;With upper extremity assist;With armrests;To chair/3-in-1  Ambulation/Gait  Ambulation/Gait Yes  Ambulation/Gait Assistance 5: Supervision  Ambulation/Gait Assistance Details cues on posture  and walker proximity with gait  Ambulation Distance (Feet) 100 Feet (x 2 reps)  Assistive device Rollator  Gait Pattern Step-through pattern;Decreased stride length;Decreased dorsiflexion - left;Shuffle;Trunk flexed  Ambulation Surface Level;Indoor   Reviewed entire current HEP and advanced corner balance exercises as current ones are too easy. Refer to pt instructions for full details.       09/09/15 1515  PT Education  Education provided Yes  Education Details HEP: advanced pt's balance HEP  Person(s) Educated Patient;Spouse  Methods Explanation;Demonstration;Handout  Comprehension Verbalized understanding;Returned demonstration;Verbal cues required;Need further instruction           PT Short Term Goals - 08/26/15 1446    PT SHORT TERM GOAL #1   Title Pt will verbalize fall prevention strategies to decrease falls risk. Target date: 09/23/15   Status On-going   PT SHORT TERM GOAL #2   Title Pt will improve gait speed to >/=1.1ft/sec with LRAD to decrease falls risk. Target date: 07/31/15   Status Achieved   PT SHORT TERM GOAL #3   Title Perform BERG and write goal if appropriate. Target date: 07/31/15   Status Achieved   PT SHORT TERM GOAL #4   Title Pt will amb. 300' with LRAD over even terrain at MOD I level to improve functional mobillty. Target date: 07/31/15   Status Achieved   PT SHORT TERM GOAL #5   Title Pt will improve BERG score to 40/56 to decr. falls risk. Target date: 09/23/15   Status On-going           PT Long Term Goals - 08/26/15 1447    PT LONG TERM GOAL #1   Title Pt will improve gait speed to >/=1.53ft/sec with LRAD to reduce falls risk. Target date: 09/23/15   Status Revised   PT LONG TERM GOAL #2   Title Pt will amb. 500' over even/paved surfaces with LRAD at MOD I level to improve functional mobility. Target date: 09/23/15   Status Revised   PT LONG TERM GOAL #3   Title Pt will improve TUG time with LRAD to </=20 seconds to reduce falls  risk.Target date: 09/23/15   Status Revised   PT LONG TERM GOAL #4   Title Pt will report zero falls over the last 2 weeks to improve safety during functional mobilty. Target date: 08/28/15   Status Achieved   PT LONG TERM GOAL #5   Title Pt will ascend/descend 4 steps with HHA in order to safely traverse steps at home. Target date: 09/23/15   Status Revised   PT LONG TERM GOAL #6   Title Pt will improve BERG score to >/=44/56 to decr. falls risk. Target date:09/23/15   Status Revised        09/09/15 1456  Plan  Clinical Impression Statement Pt's BP was within acceptable ranges for therapy today. Advanced balance HEP today with no issues reported. Pt is making steady progress toward goals. Gave spouse  contact information for MS society to look into assistance for ramp/rails on stairs.  Pt will benefit from skilled therapeutic intervention in order to improve on the following deficits Abnormal gait;Decreased endurance;Cardiopulmonary status limiting activity;Obesity;Pain;Postural dysfunction;Decreased coordination;Decreased mobility;Decreased balance;Decreased strength;Impaired sensation  Rehab Potential Good  Clinical Impairments Affecting Rehab Potential Uncontrolled HTN, COPD, CAD, CHF, MS (per pt report), asthma, LBP, arthritis, hypothyroidism  PT Frequency 2x / week  PT Duration 4 weeks  PT Treatment/Interventions ADLs/Self Care Home Management;Biofeedback;Manual techniques;Therapeutic exercise;Balance training;Therapeutic activities;Functional mobility training;Stair training;Gait training;DME Instruction;Neuromuscular re-education;Patient/family education;Orthotic Fit/Training  PT Next Visit Plan check BP; continue toward STGs  PT Home Exercise Plan Reviewed strengthening HEP; need to review balance.  Consulted and Agree with Plan of Care Patient     Patient will benefit from skilled therapeutic intervention in order to improve the following deficits and impairments:  Abnormal gait,  Decreased endurance, Cardiopulmonary status limiting activity, Obesity, Pain, Postural dysfunction, Decreased coordination, Decreased mobility, Decreased balance, Decreased strength, Impaired sensation  Visit Diagnosis: Unsteadiness on feet  Muscle weakness (generalized)  Other abnormalities of gait and mobility     Problem List Patient Active Problem List   Diagnosis Date Noted  . Cold feet 05/07/2014  . Cough 02/28/2014  . 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 Chronic diastolic HF (heart failure) (HCC) 10/17/2012  . HTN (hypertension) 10/17/2012  . H/O cardiac catheterization, 05/2012 with normal coronary arteries 10/17/2012  . Dysphagia 05/13/2012  . Pneumonia 05/11/2012  . Leukocytosis 05/10/2012  . NSTEMI, Type 2- Troponin 1.4 05/09/2012  . Goiter, chronic 05/09/2012  . Hypertensive hypertrophic cardiomyopathy, by echo 05/07/12 05/08/2012  . Acute respiratory failure with hypoxia (HCC) 05/06/2012  . Acute diastolic CHF (congestive heart failure) (HCC) 05/06/2012  . Morbid obesity (HCC) 05/06/2012  . Normal coronary arteries by cath 2008 & 12/ 2013; Normal EF;  LVH on Echo   05/06/2012  . Acute on chronic diastolic HF (heart failure) (HCC) 05/06/2012  . HTN (hypertension), malignant 05/06/2012  . Community acquired pneumonia 10/20/2011  . Uncontrolled hypertension 10/20/2011  . Diabetes mellitus type 2, uncontrolled, with complications (HCC) 10/20/2011  . Hyperlipidemia 10/20/2011  . Multiple sclerosis (HCC) 10/20/2011  . Abnormality of gait 04/21/2011  . Pain in limb 04/21/2011  . Encounter for therapeutic drug monitoring 04/21/2011    Sallyanne Kuster, PTA, [facility] [address] [place], [place] [address] [phone] 09/11/2015, 12:25 PM   Name: [name] Fry MRN: [id number] Date of Birth: 1951/01/18

## 2015-09-14 ENCOUNTER — Encounter: Payer: Self-pay | Admitting: Physical Therapy

## 2015-09-14 ENCOUNTER — Ambulatory Visit: Payer: BLUE CROSS/BLUE SHIELD | Admitting: Physical Therapy

## 2015-09-14 VITALS — BP 123/58 | HR 67

## 2015-09-14 DIAGNOSIS — M6281 Muscle weakness (generalized): Secondary | ICD-10-CM

## 2015-09-14 DIAGNOSIS — R2681 Unsteadiness on feet: Secondary | ICD-10-CM

## 2015-09-14 DIAGNOSIS — R2689 Other abnormalities of gait and mobility: Secondary | ICD-10-CM

## 2015-09-14 NOTE — Addendum Note (Signed)
Addended by: Sherren Kerns on: 09/14/2015 10:03 AM   Modules accepted: Orders

## 2015-09-14 NOTE — Therapy (Signed)
[facility] [address] [place], [place], [address] Phone: [phone]   Fax:  [phone]  Physical Therapy Treatment  Patient Details  Name: [name] Fry MRN: [id number] Date of Birth: [birth year] Referring Provider: Butch Penny, NP  Encounter Date: 09/14/2015      PT End of Session - 09/14/15 1405    Visit Number 10   Number of Visits 17   Date for PT Re-Evaluation 09/23/15   Authorization Type [employer]   PT Start Time 1401   PT Stop Time 1445   PT Time Calculation (min) 44 min   Equipment Utilized During Treatment Gait belt   Activity Tolerance Patient limited by fatigue   Behavior During Therapy [facility] for tasks assessed/performed      Past Medical History  Diagnosis Date  . Diabetes mellitus   . Hypertension 05/07/2012    Normal 2D Echo; Renal Arterial Doppler 02/02/2007 - less than 60% diameter reduction, left proximal renal artery  . Asthma   . COPD (chronic obstructive pulmonary disease) (HCC)   . Multiple sclerosis (HCC)   . Goiter   . Hypertensive hypertrophic cardiomyopathy, by echo 05/07/12 05/08/2012  . Dyspnea 12/12/2005    CHF and Unspecified Chest pain - Myocardial Perfusion - post stress EF-72, negative for ischemia  . Hx of cardiac catheterization     normal cardiac cath  . Chronic diastolic HF (heart failure) (HCC) 10/17/2012  . H/O cardiac catheterization 10/17/2012  . Hypothyroidism   . Depression   . Dyslipidemia   . Chronic low back pain   . Peripheral edema   . Neurogenic bladder   . Obesity   . Gait disorder   . GERD (gastroesophageal reflux disease)   . Arthritis   . Complication of anesthesia     HAS ASTHMA FLARES AFTER MOST SURGERIES  . Pneumonia DEC 2013    Past Surgical History  Procedure Laterality Date  . Cardiac catheterization Left 05/06/2012    Moderate caliber vessel from mid LAD  . Cardiac catheterization Right 04/02/2007    Left ventricular hypertrophy, no coronary  disease, no renal artery stenosis  . Cesarean section       3x -[id number] AND 1985  . Cholecystectomy      1973  . Breast surgery      2 tumors removed -right breast  . Appendectomy      1973  . Arthoscopic rotaor cuff repair Right 1995  . Joint replacement       right shoulder 1985   . Thyroidectomy N/A 11/01/2013    Procedure: THYROIDECTOMY;  Surgeon: Adolph Pollack, MD;  Location: [facility];  Service: General;  Laterality: N/A;  . Left heart catheterization with coronary angiogram N/A 05/07/2012    Procedure: LEFT HEART CATHETERIZATION WITH CORONARY ANGIOGRAM;  Surgeon: Marykay Lex, MD;  Location: [facility] CATH LAB;  Service: Cardiovascular;  Laterality: N/A;    Filed Vitals:   09/14/15 1404 09/14/15 1423 09/14/15 1443  BP: 128/94- start of session 134/87- after both gait trials 123/58- end of session  Pulse: 92 93 67        Subjective Assessment - 09/14/15 1404    Subjective No new complaints. No new falls. Denies any pain other that foot pain. Reports she did take her medication today. See's Dr Chestine Spore Wednesday, not today.    Patient is accompained by: Family member  spouse in lobby   Pertinent History Uncontrolled HTN- monitor BP (md orders- pt may continue therapy as long  as BP < 200/110   Patient Stated Goals Walk without blood pressure getting too high   Currently in Pain? Yes   Pain Score 7           [facility] Adult PT Treatment/Exercise - 09/14/15 1406    Transfers   Transfers Sit to Stand;Stand to Sit   Sit to Stand 5: Supervision;With upper extremity assist;From bed;With armrests   Stand to Sit 5: Supervision;With upper extremity assist;With armrests;To chair/3-in-1   Ambulation/Gait   Ambulation/Gait Yes   Ambulation/Gait Assistance 5: Supervision   Ambulation/Gait Assistance Details verbal cues for posture and increased step length and height bil legs.   Ambulation Distance (Feet) 340 Feet  x1, 220 x1   Assistive device Rollator   Gait Pattern Step-through  pattern;Decreased stride length;Decreased dorsiflexion - left;Shuffle;Trunk flexed   Ambulation Surface Level;Indoor   Neuro Re-ed    Neuro Re-ed Details  single leg standing activities: alternating fwd foot taps x 10 each, cross foot taps x 10 each to 6 inch box with occasional UE support and up to min assist for balance; standing on airex with rollator in front for safety and mat behind pt: wide base of support- EC 20 sec's x 3 reps, EO head movements up<>down, left<>right x 8-10 each, narrow base of support: EO no head movments, 20 sec holds x 3 reps with UE support between each rep to allow for balace regain/restabilize, pt needed up to min assist with airex balance activities.                                     PT Short Term Goals - 08/26/15 1446    PT SHORT TERM GOAL #1   Title Pt will verbalize fall prevention strategies to decrease falls risk. Target date: 09/23/15   Status On-going   PT SHORT TERM GOAL #2   Title Pt will improve gait speed to >/=1.80ft/sec with LRAD to decrease falls risk. Target date: 07/31/15   Status Achieved   PT SHORT TERM GOAL #3   Title Perform BERG and write goal if appropriate. Target date: 07/31/15   Status Achieved   PT SHORT TERM GOAL #4   Title Pt will amb. 300' with LRAD over even terrain at MOD I level to improve functional mobillty. Target date: 07/31/15   Status Achieved   PT SHORT TERM GOAL #5   Title Pt will improve BERG score to 40/56 to decr. falls risk. Target date: 09/23/15   Status On-going           PT Long Term Goals - 08/26/15 1447    PT LONG TERM GOAL #1   Title Pt will improve gait speed to >/=1.72ft/sec with LRAD to reduce falls risk. Target date: 09/23/15   Status Revised   PT LONG TERM GOAL #2   Title Pt will amb. 500' over even/paved surfaces with LRAD at MOD I level to improve functional mobility. Target date: 09/23/15   Status Revised   PT LONG TERM GOAL #3   Title Pt will improve TUG time with LRAD to </=20 seconds to  reduce falls risk.Target date: 09/23/15   Status Revised   PT LONG TERM GOAL #4   Title Pt will report zero falls over the last 2 weeks to improve safety during functional mobilty. Target date: 08/28/15   Status Achieved   PT LONG TERM GOAL #5   Title Pt will ascend/descend 4  steps with HHA in order to safely traverse steps at home. Target date: 09/23/15   Status Revised   PT LONG TERM GOAL #6   Title Pt will improve BERG score to >/=44/56 to decr. falls risk. Target date:09/23/15   Status Revised           Plan - 09/14/15 1453    Clinical Impression Statement Today's skiilled session focused on gait, activity tolerance and balance with no issues reported other than fatigue. BP remained within acceptable ranges throughout the session. Pt is making steady progress toward goals.    Rehab Potential Good   Clinical Impairments Affecting Rehab Potential Uncontrolled HTN, COPD, CAD, CHF, MS (per pt report), asthma, LBP, arthritis, hypothyroidism   PT Frequency 2x / week   PT Duration 4 weeks   PT Treatment/Interventions ADLs/Self Care Home Management;Biofeedback;Manual techniques;Therapeutic exercise;Balance training;Therapeutic activities;Functional mobility training;Stair training;Gait training;DME Instruction;Neuromuscular re-education;Patient/family education;Orthotic Fit/Training   PT Next Visit Plan check BP; continue toward STGs   PT Home Exercise Plan has HEP for balance and strengtheniing   Consulted and Agree with Plan of Care Patient;Family member/caregiver   Family Member Consulted pt's husband-Johnny      Patient will benefit from skilled therapeutic intervention in order to improve the following deficits and impairments:  Abnormal gait, Decreased endurance, Cardiopulmonary status limiting activity, Obesity, Pain, Postural dysfunction, Decreased coordination, Decreased mobility, Decreased balance, Decreased strength, Impaired sensation  Visit Diagnosis: Unsteadiness on  feet  Muscle weakness (generalized)  Other abnormalities of gait and mobility     Problem List Patient Active Problem List   Diagnosis Date Noted  . Cold feet 05/07/2014  . Cough 02/28/2014  . 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 Chronic diastolic HF (heart failure) (HCC) 10/17/2012  . HTN (hypertension) 10/17/2012  . H/O cardiac catheterization, 05/2012 with normal coronary arteries 10/17/2012  . Dysphagia 05/13/2012  . Pneumonia 05/11/2012  . Leukocytosis 05/10/2012  . NSTEMI, Type 2- Troponin 1.4 05/09/2012  . Goiter, chronic 05/09/2012  . Hypertensive hypertrophic cardiomyopathy, by echo 05/07/12 05/08/2012  . Acute respiratory failure with hypoxia (HCC) 05/06/2012  . Acute diastolic CHF (congestive heart failure) (HCC) 05/06/2012  . Morbid obesity (HCC) 05/06/2012  . Normal coronary arteries by cath 2008 & 12/ 2013; Normal EF;  LVH on Echo   05/06/2012  . Acute on chronic diastolic HF (heart failure) (HCC) 05/06/2012  . HTN (hypertension), malignant 05/06/2012  . Community acquired pneumonia 10/20/2011  . Uncontrolled hypertension 10/20/2011  . Diabetes mellitus type 2, uncontrolled, with complications (HCC) 10/20/2011  . Hyperlipidemia 10/20/2011  . Multiple sclerosis (HCC) 10/20/2011  . Abnormality of gait 04/21/2011  . Pain in limb 04/21/2011  . Encounter for therapeutic drug monitoring 04/21/2011    Sallyanne Kuster, PTA, [facility] [address], [address] [place], [place] [address] [phone] 09/14/2015, 2:57 PM   Name: [name] Fry MRN: [id number] Date of Birth: 06/20/1950

## 2015-09-15 ENCOUNTER — Ambulatory Visit: Payer: Commercial Managed Care - HMO | Admitting: Physical Therapy

## 2015-09-17 ENCOUNTER — Telehealth: Payer: Self-pay | Admitting: Neurology

## 2015-09-17 NOTE — Telephone Encounter (Signed)
Patient called, states she thinks she needs to be seen sooner than 10/29/15, has fallen backwards 3 times within the past week, "bumped head 3 times on the concrete", MS Society has recommended a prescription for a ramp to get in and out of home. Once they have prescription then they can help with funding of ramp.

## 2015-09-17 NOTE — Telephone Encounter (Addendum)
Called pt back. Scheduled earlier f/u. Ok per Dr Anne Hahn to double book 12pm on 09/22/15. Pt knows to check in 1145am.

## 2015-09-18 ENCOUNTER — Encounter: Payer: Self-pay | Admitting: Pharmacist Clinician (PhC)/ Clinical Pharmacy Specialist

## 2015-09-18 ENCOUNTER — Ambulatory Visit (INDEPENDENT_AMBULATORY_CARE_PROVIDER_SITE_OTHER): Payer: BLUE CROSS/BLUE SHIELD | Admitting: Pharmacist Clinician (PhC)/ Clinical Pharmacy Specialist

## 2015-09-18 VITALS — BP 140/86 | HR 72 | Ht 61.0 in | Wt 227.2 lb

## 2015-09-18 DIAGNOSIS — I10 Essential (primary) hypertension: Secondary | ICD-10-CM

## 2015-09-18 NOTE — Progress Notes (Signed)
09/18/2015 [name] Fry [birth year] [id number]   HPI:  [name] Fry is a 65 y.o. female patient of Dr [name] Fry, with a PMH below who presents today for return hypertension clinic visit.  I usually see her about once each quarter, as her BP tends to fluctuate regularly.  She has had multiple problems with rehab sessions cancelled for high pressure, so we indicated to them that she could continue as long as her pressure was < 200/100; as the exercise does tend to lower her pressure.   Her history is significant for hypertension, hyperlipidemia and diabetes. She also has MS and recently has been having more muscle weakness, falling 3 times in a 2 week period at the end of February.   Repeated cardiac catheterizations in 2008 and in 2013 showed normal coronary arteries.  A renal artery scan in 2008 was read as normal.  She was treated in December 2013 at [facility] for hypertensive emergency and pneumonia.   She has very labile blood pressures and we've had her on maximum therapy for some time.  She continues with the edarby 80 mg daily, amlodipine 10 mg daily, hydralazine 100 mg bid, clonidine 0.2 mg bid, metoprolol 100 mg bid, and spironolactone 25 mg qd.  She states compliance with all medications, although she does note some urinary frequency recently.  Her sleep habits are poor, probably related to the MS.  She sleeps in her "computer chair" from 7 pm until about 7 am, not sleeping much, just dozing.  Around 7 am she climbs into bed and will sleep (soundly per her husband) for about 2-3 hours.  Then she is up again.  She also admits to napping during the day.    She quit smoking in 2005 and does not drink any alcohol or caffeine.  She has been aware of the need for salt restriction for some time and believes she is doing well with this, using Ms [name] Fry and avoiding high sodium foods.  She has been encouraged to walk more, but has not done much recently due to increased weakness and recent falls.  Overall  she has dropped 27 pounds in the past year and she continues to work at decreasing her weight.  Her diabetes is not well controlled, although no A1c is available, her glucose tends to run in the 200s quite regularly.    She is unaware of any family history of cardiac disease, as she was adopted.  She has 3 children, all in their 39s, and none have cardiac concerns at this point.    She does not do any regular exercise.  She attends rehab, although believes that her sessions are about over.   At times she has walked laps around the [facility], but has not done so recently.  Home readings vary from 120-200/80-120.  There was only one reading in the past 6 weeks at 200, nothing higher.  She also worries when her heart rate goes into the 90's, worried that it's too much for her heart.  Her home readings range from 71-109, with morning readings averaging low 85-90 and evening in then 70's.     Current Outpatient Prescriptions  Medication Sig Dispense Refill  . amLODipine (NORVASC) 10 MG tablet Take 1 tablet by mouth  daily 90 tablet 2  . aspirin EC 81 MG tablet Take 81 mg by mouth every morning.    Marland Kitchen atorvastatin (LIPITOR) 20 MG tablet Take 1 tablet by mouth at  bedtime 90 tablet 2  .  AVONEX PREFILLED 30 MCG/0.5ML PSKT injection Inject intramuscularly  40mg every week. 1 kit 3  . cloNIDine (CATAPRES) 0.2 MG tablet Take 1 tablet by mouth twice daily and extra daily dose for BP >170/100 225 tablet 1  . EDARBI 80 MG TABS Take 1 tablet by mouth  daily 90 tablet 2  . ergocalciferol (VITAMIN D2) 50000 UNITS capsule Take 50,000 Units by mouth 2 (two) times a week.    . fluticasone (FLONASE) 50 MCG/ACT nasal spray Place 2 sprays into the nose daily. 1 g 0  . fluticasone (FLOVENT HFA) 220 MCG/ACT inhaler Inhale 2 puffs into the lungs 2 (two) times daily.    . furosemide (LASIX) 40 MG tablet Take 1 tablet by mouth  daily 90 tablet 3  . hydrALAZINE (APRESOLINE) 100 MG tablet Take 1 tablet by mouth 3  times  daily 270 tablet 3  . insulin lispro protamine-lispro (HUMALOG 75/25 MIX) (75-25) 100 UNIT/ML SUSP injection Inject 60 Units into the skin every morning.    . interferon beta-1a (AVONEX) 30 MCG/0.5ML injection Inject 30 mcg into the muscle every Friday. TAKES QFRIDAY AT 800 PM    . liothyronine (CYTOMEL) 25 MCG tablet Take 50 mcg by mouth 2 (two) times daily.    . metoprolol (LOPRESSOR) 100 MG tablet Take 1 tablet by mouth two  times daily 180 tablet 2  . pantoprazole (PROTONIX) 40 MG tablet Take 1 tablet by mouth  daily 90 tablet 2  . pregabalin (LYRICA) 75 MG capsule Take 75 mg by mouth at bedtime. Reported on 08/31/2015    . spironolactone (ALDACTONE) 25 MG tablet Take 2 tablets (50 mg total) by mouth daily. 60 tablet 3  . traZODone (DESYREL) 100 MG tablet Take 2 tablets by mouth at  bedtime 180 tablet 0   No current facility-administered medications for this visit.    Allergies  Allergen Reactions  . Codeine Other (See Comments)    PASS OUT  . Other     Poppy seeds.- EYES SWELL AND HIVES  . Penicillins Other (See Comments)    "PASS OUT, MESSES WITH MY HEART RATE"  . Tape     Pink tape-RASH AND ITCHING    Past Medical History  Diagnosis Date  . Diabetes mellitus   . Hypertension 05/07/2012    Normal 2D Echo; Renal Arterial Doppler 02/02/2007 - less than 60% diameter reduction, left proximal renal artery  . Asthma   . COPD (chronic obstructive pulmonary disease) ([facility]   . Multiple sclerosis ([facility]   . Goiter   . Hypertensive hypertrophic cardiomyopathy, by echo 05/07/12 05/08/2012  . Dyspnea 12/12/2005    CHF and Unspecified Chest pain - Myocardial Perfusion - post stress EF-72, negative for ischemia  . Hx of cardiac catheterization     normal cardiac cath  . Chronic diastolic HF (heart failure) ([facility] 10/17/2012  . H/O cardiac catheterization 10/17/2012  . Hypothyroidism   . Depression   . Dyslipidemia   . Chronic low back pain   . Peripheral edema   . Neurogenic bladder   .  Obesity   . Gait disorder   . GERD (gastroesophageal reflux disease)   . Arthritis   . Complication of anesthesia     HAS ASTHMA FLARES AFTER MOST SURGERIES  . Pneumonia DEC 2013    Blood pressure 140/86, pulse 72, height '5\' 1"'$  (1.549 m), weight 227 lb 3.2 oz (103.057 kg).     KTommy MedalPharmD CPP [facility]

## 2015-09-18 NOTE — Patient Instructions (Signed)
  Your blood pressure today is 140/86 (goal is < 140/90)  Check your blood pressure at home daily and keep record of the readings.  Take your BP meds as follows: continue with all current medications  Bring all of your meds, your BP cuff and your record of home blood pressures to your next appointment.  Exercise as you're able, try to walk approximately 30 minutes per day.  Keep salt intake to a minimum, especially watch canned and prepared boxed foods.  Eat more fresh fruits and vegetables and fewer canned items.  Avoid eating in fast food restaurants.    HOW TO TAKE YOUR BLOOD PRESSURE: . Rest 5 minutes before taking your blood pressure. .  Don't smoke or drink caffeinated beverages for at least 30 minutes before. . Take your blood pressure before (not after) you eat. . Sit comfortably with your back supported and both feet on the floor (don't cross your legs). . Elevate your arm to heart level on a table or a desk. . Use the proper sized cuff. It should fit smoothly and snugly around your bare upper arm. There should be enough room to slip a fingertip under the cuff. The bottom edge of the cuff should be 1 inch above the crease of the elbow. . Ideally, take 3 measurements at one sitting and record the average.

## 2015-09-18 NOTE — Assessment & Plan Note (Signed)
Today her BP was very difficult to hear, I was only able to get a reading from her right arm, and the electronic cuff was not able to read either arm.  She is currently on the maximum doses of edarby, amlodipine and metoprolol, as well as high doses of hydralazine and clonidine.  She started spironolactone in February and her labs drawn 2 weeks later show no rise in potassium.  I am going to review with [name] the option of doing a repeat on her renal dopplers before increasing her medication again.  Her last doppler was normal in 2008.  I would also consider a sleep study, but with her severe insomnia I don't know that it would be a viable study.

## 2015-09-21 ENCOUNTER — Ambulatory Visit: Payer: BLUE CROSS/BLUE SHIELD | Admitting: Physical Therapy

## 2015-09-21 VITALS — BP 150/98 | HR 74

## 2015-09-21 DIAGNOSIS — R2681 Unsteadiness on feet: Secondary | ICD-10-CM | POA: Diagnosis not present

## 2015-09-21 DIAGNOSIS — R2689 Other abnormalities of gait and mobility: Secondary | ICD-10-CM

## 2015-09-21 NOTE — Therapy (Signed)
[facility] [address] [place], [place], [address] Phone: [phone]   Fax:  [phone]  Physical Therapy Treatment  Patient Details  Name: [name] Fry MRN: [id number] Date of Birth: 03-02-1951 Referring Provider: Butch Penny, NP  Encounter Date: 09/21/2015      PT End of Session - 09/21/15 1542    Visit Number 11   Number of Visits 17   Date for PT Re-Evaluation 09/23/15   Authorization Type [employer]   PT Start Time 1455   PT Stop Time 1536   PT Time Calculation (min) 41 min   Activity Tolerance Patient limited by fatigue   Behavior During Therapy [facility] for tasks assessed/performed      Past Medical History  Diagnosis Date  . Diabetes mellitus   . Hypertension 05/07/2012    Normal 2D Echo; Renal Arterial Doppler 02/02/2007 - less than 60% diameter reduction, left proximal renal artery  . Asthma   . COPD (chronic obstructive pulmonary disease) (HCC)   . Multiple sclerosis (HCC)   . Goiter   . Hypertensive hypertrophic cardiomyopathy, by echo 05/07/12 05/08/2012  . Dyspnea 12/12/2005    CHF and Unspecified Chest pain - Myocardial Perfusion - post stress EF-72, negative for ischemia  . Hx of cardiac catheterization     normal cardiac cath  . Chronic diastolic HF (heart failure) (HCC) 10/17/2012  . H/O cardiac catheterization 10/17/2012  . Hypothyroidism   . Depression   . Dyslipidemia   . Chronic low back pain   . Peripheral edema   . Neurogenic bladder   . Obesity   . Gait disorder   . GERD (gastroesophageal reflux disease)   . Arthritis   . Complication of anesthesia     HAS ASTHMA FLARES AFTER MOST SURGERIES  . Pneumonia DEC 2013    Past Surgical History  Procedure Laterality Date  . Cardiac catheterization Left 05/06/2012    Moderate caliber vessel from mid LAD  . Cardiac catheterization Right 04/02/2007    Left ventricular hypertrophy, no coronary disease, no renal artery stenosis  . Cesarean  section       3x -[id number] AND 1985  . Cholecystectomy      1973  . Breast surgery      2 tumors removed -right breast  . Appendectomy      1973  . Arthoscopic rotaor cuff repair Right 1995  . Joint replacement       right shoulder 1985   . Thyroidectomy N/A 11/01/2013    Procedure: THYROIDECTOMY;  Surgeon: Adolph Pollack, MD;  Location: [facility];  Service: General;  Laterality: N/A;  . Left heart catheterization with coronary angiogram N/A 05/07/2012    Procedure: LEFT HEART CATHETERIZATION WITH CORONARY ANGIOGRAM;  Surgeon: Marykay Lex, MD;  Location: [facility] CATH LAB;  Service: Cardiovascular;  Laterality: N/A;    Filed Vitals:   09/21/15 1502 09/21/15 1516 09/21/15 1530  BP: 167/108 150/94 150/98  Pulse: 80 74   SpO2: 99% 94%         Subjective Assessment - 09/21/15 1501    Subjective PCP believes that pt is having an MS flare up (has increased swelling in bil LEs) and Pt has an appt with Neurologist tomorrow. Reports not sleeping at night due to burning pain in back and LEs.   Currently in Pain? Yes   Pain Score 7    Pain Location Back   Pain Orientation Right;Left   Pain Descriptors / Indicators Burning  Pain Type Chronic pain   Pain Radiating Towards down bil LEs and feet   Pain Onset More than a month ago   Pain Frequency Constant                         [facility] Adult PT Treatment/Exercise - 09/21/15 0001    Knee/Hip Exercises: Aerobic   Other Aerobic Scifit L1.0 5 minx2                PT Education - 09/21/15 1541    Education provided Yes   Education Details Benefit of cardiovascular exercise.   Person(s) Educated Patient   Methods Explanation   Comprehension Verbalized understanding;Need further instruction          PT Short Term Goals - 08/26/15 1446    PT SHORT TERM GOAL #1   Title Pt will verbalize fall prevention strategies to decrease falls risk. Target date: 09/23/15   Status On-going   PT SHORT TERM GOAL #2   Title Pt  will improve gait speed to >/=1.4ft/sec with LRAD to decrease falls risk. Target date: 07/31/15   Status Achieved   PT SHORT TERM GOAL #3   Title Perform BERG and write goal if appropriate. Target date: 07/31/15   Status Achieved   PT SHORT TERM GOAL #4   Title Pt will amb. 300' with LRAD over even terrain at MOD I level to improve functional mobillty. Target date: 07/31/15   Status Achieved   PT SHORT TERM GOAL #5   Title Pt will improve BERG score to 40/56 to decr. falls risk. Target date: 09/23/15   Status On-going           PT Long Term Goals - 08/26/15 1447    PT LONG TERM GOAL #1   Title Pt will improve gait speed to >/=1.14ft/sec with LRAD to reduce falls risk. Target date: 09/23/15   Status Revised   PT LONG TERM GOAL #2   Title Pt will amb. 500' over even/paved surfaces with LRAD at MOD I level to improve functional mobility. Target date: 09/23/15   Status Revised   PT LONG TERM GOAL #3   Title Pt will improve TUG time with LRAD to </=20 seconds to reduce falls risk.Target date: 09/23/15   Status Revised   PT LONG TERM GOAL #4   Title Pt will report zero falls over the last 2 weeks to improve safety during functional mobilty. Target date: 08/28/15   Status Achieved   PT LONG TERM GOAL #5   Title Pt will ascend/descend 4 steps with HHA in order to safely traverse steps at home. Target date: 09/23/15   Status Revised   PT LONG TERM GOAL #6   Title Pt will improve BERG score to >/=44/56 to decr. falls risk. Target date:09/23/15   Status Revised               Plan - 09/21/15 1521    Clinical Impression Statement Trialled Scit fit for cardiovascular exercise.  Pt tolerated well at low speed and with rest breaks.  BP actually went down with exercise; notified supervising PT who stated that it can be a normal response for pt's condition.   Clinical Impairments Affecting Rehab Potential Uncontrolled HTN, COPD, CAD, CHF, MS (per pt report), asthma, LBP, arthritis, hypothyroidism    PT Duration 4 weeks   PT Treatment/Interventions ADLs/Self Care Home Management;Biofeedback;Manual techniques;Therapeutic exercise;Balance training;Therapeutic activities;Functional mobility training;Stair training;Gait training;DME Instruction;Neuromuscular re-education;Patient/family education;Orthotic Fit/Training  PT Next Visit Plan check BP; continue toward STGs, Walking Program, cardio exercise.   PT Home Exercise Plan has HEP for balance and strengtheniing   Consulted and Agree with Plan of Care Patient;Family member/caregiver      Patient will benefit from skilled therapeutic intervention in order to improve the following deficits and impairments:  Abnormal gait, Decreased endurance, Cardiopulmonary status limiting activity, Obesity, Pain, Postural dysfunction, Decreased coordination, Decreased mobility, Decreased balance, Decreased strength, Impaired sensation  Visit Diagnosis: Other abnormalities of gait and mobility     Problem List Patient Active Problem List   Diagnosis Date Noted  . Cold feet 05/07/2014  . Cough 02/28/2014  . 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 Chronic diastolic HF (heart failure) (HCC) 10/17/2012  . HTN (hypertension) 10/17/2012  . H/O cardiac catheterization, 05/2012 with normal coronary arteries 10/17/2012  . Dysphagia 05/13/2012  . Pneumonia 05/11/2012  . Leukocytosis 05/10/2012  . NSTEMI, Type 2- Troponin 1.4 05/09/2012  . Goiter, chronic 05/09/2012  . Hypertensive hypertrophic cardiomyopathy, by echo 05/07/12 05/08/2012  . Acute respiratory failure with hypoxia (HCC) 05/06/2012  . Acute diastolic CHF (congestive heart failure) (HCC) 05/06/2012  . Morbid obesity (HCC) 05/06/2012  . Normal coronary arteries by cath 2008 & 12/ 2013; Normal EF;  LVH on Echo   05/06/2012  . Acute on chronic diastolic HF (heart failure) (HCC) 05/06/2012  . HTN (hypertension), malignant 05/06/2012  . Community acquired pneumonia 10/20/2011  . Uncontrolled  hypertension 10/20/2011  . Diabetes mellitus type 2, uncontrolled, with complications (HCC) 10/20/2011  . Hyperlipidemia 10/20/2011  . Multiple sclerosis (HCC) 10/20/2011  . Abnormality of gait 04/21/2011  . Pain in limb 04/21/2011  . Encounter for therapeutic drug monitoring 04/21/2011   Hortencia Conradi, PTA  09/21/2015, 3:48 PM [facility] [address] [place], [place], [address] Phone: [phone]   Fax:  [phone]  Name: [name] Fry MRN: [id number] Date of Birth: [birth year]

## 2015-09-22 ENCOUNTER — Encounter: Payer: Self-pay | Admitting: Neurology

## 2015-09-22 ENCOUNTER — Ambulatory Visit (INDEPENDENT_AMBULATORY_CARE_PROVIDER_SITE_OTHER): Payer: BLUE CROSS/BLUE SHIELD | Admitting: Neurology

## 2015-09-22 VITALS — BP 122/86 | HR 84 | Ht 61.0 in | Wt 228.0 lb

## 2015-09-22 DIAGNOSIS — G35 Multiple sclerosis: Secondary | ICD-10-CM

## 2015-09-22 DIAGNOSIS — R269 Unspecified abnormalities of gait and mobility: Secondary | ICD-10-CM | POA: Diagnosis not present

## 2015-09-22 DIAGNOSIS — Z5181 Encounter for therapeutic drug level monitoring: Secondary | ICD-10-CM

## 2015-09-22 NOTE — Progress Notes (Signed)
Reason for visit: Multiple sclerosis  [name] Fry is an 65 y.o. female  History of present illness:  [name] Fry is a 65 year old right-handed black female with a history of multiple sclerosis associated with a gait disorder. The patient indicates that she has had worsening of her walking that began suddenly in the summer 2016, but has gradually worsened since that time. The patient has had 6 falls since last seen in January 2017. She indicates that she will have a tendency to go backwards. She denies any change in bowel or bladder control. The patient has had some blurring of vision. She reports and tingling sensations in her hands. She uses a walker for ambulation. She is in physical therapy currently, she has one or 2 sessions left. The patient is on Avonex, she in general tolerates the treatments. She has had a recent MRI of the brain has not shown any progression of her white matter changes of the brain. She returns for further evaluation. The patient reports chronic fatigue, but then indicates that she has chronic insomnia issues.  Past Medical History  Diagnosis Date  . Diabetes mellitus   . Hypertension 05/07/2012    Normal 2D Echo; Renal Arterial Doppler 02/02/2007 - less than 60% diameter reduction, left proximal renal artery  . Asthma   . COPD (chronic obstructive pulmonary disease) (HCC)   . Multiple sclerosis (HCC)   . Goiter   . Hypertensive hypertrophic cardiomyopathy, by echo 05/07/12 05/08/2012  . Dyspnea 12/12/2005    CHF and Unspecified Chest pain - Myocardial Perfusion - post stress EF-72, negative for ischemia  . Hx of cardiac catheterization     normal cardiac cath  . Chronic diastolic HF (heart failure) (HCC) 10/17/2012  . H/O cardiac catheterization 10/17/2012  . Hypothyroidism   . Depression   . Dyslipidemia   . Chronic low back pain   . Peripheral edema   . Neurogenic bladder   . Obesity   . Gait disorder   . GERD (gastroesophageal reflux disease)   .  Arthritis   . Complication of anesthesia     HAS ASTHMA FLARES AFTER MOST SURGERIES  . Pneumonia DEC 2013    Past Surgical History  Procedure Laterality Date  . Cardiac catheterization Left 05/06/2012    Moderate caliber vessel from mid LAD  . Cardiac catheterization Right 04/02/2007    Left ventricular hypertrophy, no coronary disease, no renal artery stenosis  . Cesarean section       3x -[id number] AND 1985  . Cholecystectomy      1973  . Breast surgery      2 tumors removed -right breast  . Appendectomy      1973  . Arthoscopic rotaor cuff repair Right 1995  . Joint replacement       right shoulder 1985   . Thyroidectomy N/A 11/01/2013    Procedure: THYROIDECTOMY;  Surgeon: Adolph Pollack, MD;  Location: [facility];  Service: General;  Laterality: N/A;  . Left heart catheterization with coronary angiogram N/A 05/07/2012    Procedure: LEFT HEART CATHETERIZATION WITH CORONARY ANGIOGRAM;  Surgeon: Marykay Lex, MD;  Location: [facility] CATH LAB;  Service: Cardiovascular;  Laterality: N/A;    Family History  Problem Relation Age of Onset  . Adopted: Yes  . Coronary artery disease Father 69    MI  . Diabetes Father   . Heart disease Mother   . Diabetes Mother     Social history:  reports that she quit  smoking about 11 years ago. Her smoking use included Cigarettes. She has a 20 pack-year smoking history. She has never used smokeless tobacco. She reports that she does not drink alcohol or use illicit drugs.    Allergies  Allergen Reactions  . Codeine Other (See Comments)    PASS OUT  . Other     Poppy seeds.- EYES SWELL AND HIVES  . Penicillins Other (See Comments)    "PASS OUT, MESSES WITH MY HEART RATE"  . Tape     Pink tape-RASH AND ITCHING    Medications:  Prior to Admission medications   Medication Sig Start Date End Date Taking? Authorizing Provider  amLODipine (NORVASC) 10 MG tablet Take 1 tablet by mouth  daily 05/04/15  Yes Runell Gess, MD  aspirin EC 81  MG tablet Take 81 mg by mouth every morning.   Yes Historical Provider, MD  atorvastatin (LIPITOR) 20 MG tablet Take 1 tablet by mouth at  bedtime 05/04/15  Yes Runell Gess, MD  AVONEX PREFILLED 30 MCG/0.5ML PSKT injection Inject intramuscularly  every week. 05/20/14  Yes York Spaniel, MD  cloNIDine (CATAPRES) 0.2 MG tablet Take 1 tablet by mouth twice daily and extra daily dose for BP >170/100 07/09/15  Yes Runell Gess, MD  EDARBI 80 MG TABS Take 1 tablet by mouth  daily 05/04/15  Yes Runell Gess, MD  ergocalciferol (VITAMIN D2) 50000 UNITS capsule Take 50,000 Units by mouth 2 (two) times a week.   Yes Historical Provider, MD  fluticasone (FLONASE) 50 MCG/ACT nasal spray Place 2 sprays into the nose daily. 10/23/11  Yes Sosan Forrestine Him, MD  fluticasone (FLOVENT HFA) 220 MCG/ACT inhaler Inhale 2 puffs into the lungs 2 (two) times daily.   Yes Historical Provider, MD  furosemide (LASIX) 40 MG tablet Take 1 tablet by mouth  daily 04/20/15  Yes Runell Gess, MD  hydrALAZINE (APRESOLINE) 100 MG tablet Take 1 tablet by mouth 3  times daily 04/20/15  Yes Runell Gess, MD  insulin lispro protamine-lispro (HUMALOG 75/25 MIX) (75-25) 100 UNIT/ML SUSP injection Inject 60 Units into the skin every morning.   Yes Historical Provider, MD  interferon beta-1a (AVONEX) 30 MCG/0.5ML injection Inject 30 mcg into the muscle every Friday. TAKES QFRIDAY AT 800 PM   Yes Historical Provider, MD  liothyronine (CYTOMEL) 25 MCG tablet Take 50 mcg by mouth 2 (two) times daily.   Yes Historical Provider, MD  metoprolol (LOPRESSOR) 100 MG tablet Take 1 tablet by mouth two  times daily 05/04/15  Yes Runell Gess, MD  pantoprazole (PROTONIX) 40 MG tablet Take 1 tablet by mouth  daily 06/24/15  Yes Runell Gess, MD  spironolactone (ALDACTONE) 25 MG tablet Take 2 tablets (50 mg total) by mouth daily. 07/30/15  Yes Runell Gess, MD  traZODone (DESYREL) 100 MG tablet Take 2 tablets by mouth at   bedtime 07/07/15  Yes York Spaniel, MD  pregabalin (LYRICA) 75 MG capsule Take 75 mg by mouth at bedtime. Reported on 09/22/2015    Historical Provider, MD    ROS:  Out of a complete 14 system review of symptoms, the patient complains only of the following symptoms, and all other reviewed systems are negative.  Decreased activity, fatigue Hearing loss, difficulty swallowing Loss of vision Cold intolerance, heat intolerance, excessive thirst Anemia Chronic insomnia  Blood pressure 122/86, pulse 84, height 5\' 1"  (1.549 m), weight 228 lb (103.42 kg).  Physical Exam  General:  The patient is alert and cooperative at the time of the examination. The patient is markedly obese.  Ears: Tympanic membranes are clear.  Skin: No significant peripheral edema is noted.   Neurologic Exam  Mental status: The patient is alert and oriented x 3 at the time of the examination. The patient has apparent normal recent and remote memory, with an apparently normal attention span and concentration ability.   Cranial nerves: Facial symmetry is present. Speech is normal, no aphasia or dysarthria is noted. Extraocular movements are full. With primary gaze, there is exotropia of the right eye. Visual fields are full. Pupils are equal, round, and reactive to light. Discs are flat bilaterally.  Motor: The patient has good strength in the upper extremities. With the lower extremities, the patient has giveaway type weakness with adduction and abduction of the legs, and give way weakness with flexion at the hip bilaterally. Distally, she has relatively good strength.  Sensory examination: Soft touch sensation is symmetric on the face, arms, and legs, but the patient reports that she is numb on all 4 extremities.  Coordination: The patient has good heel-to-shin bilaterally. The patient demonstrates dysmetria with finger-nose-finger bilaterally, but at other times, no ataxia seen.  Gait and station: The patient  is able to ambulate with a walker, the gait is wide-based. Tandem gait was not attempted. Romberg is negative.  Reflexes: Deep tendon reflexes are symmetric, but are depressed.   MRI brain 07/09/15:  IMPRESSION:  Abnormal MRI brain (without) demonstrating: 1. Mild periventricular and subcortical and juxtacortical chronic multiple sclerosis plaques. 2. No acute findings. IV contrast not ordered for this study, and therefore detection of acute demyelinating plaques is limited.  3. Overall no change from MRI on 06/26/14.     Assessment/Plan:  1. Multiple sclerosis  2. Gait disorder  The patient reports gradual worsening of her clinical deficits but the examination today suggests a fair amount of give way weakness, and poor motor effort. The patient will be taken off of Avonex, and switched to Tecfidera. Blood work will be done today. The patient will follow-up in 5 months, sooner if needed. She will complete her physical therapy.  Marlan Palau MD 09/22/2015 7:18 PM  [facility] [address] [place], [place] [address]  Phone [phone] Fax [phone]

## 2015-09-23 ENCOUNTER — Telehealth: Payer: Self-pay | Admitting: Neurology

## 2015-09-23 DIAGNOSIS — G35 Multiple sclerosis: Secondary | ICD-10-CM

## 2015-09-23 DIAGNOSIS — R269 Unspecified abnormalities of gait and mobility: Secondary | ICD-10-CM

## 2015-09-23 LAB — COMPREHENSIVE METABOLIC PANEL
ALK PHOS: 62 IU/L (ref 39–117)
ALT: 20 IU/L (ref 0–32)
AST: 26 IU/L (ref 0–40)
Albumin/Globulin Ratio: 1.4 (ref 1.2–2.2)
Albumin: 4.2 g/dL (ref 3.6–4.8)
BUN/Creatinine Ratio: 14 (ref 12–28)
BUN: 15 mg/dL (ref 8–27)
Bilirubin Total: 1.3 mg/dL — ABNORMAL HIGH (ref 0.0–1.2)
CALCIUM: 9.4 mg/dL (ref 8.7–10.3)
CO2: 28 mmol/L (ref 18–29)
CREATININE: 1.07 mg/dL — AB (ref 0.57–1.00)
Chloride: 94 mmol/L — ABNORMAL LOW (ref 96–106)
GFR calc Af Amer: 64 mL/min/{1.73_m2} (ref >59)
GFR, EST NON AFRICAN AMERICAN: 55 mL/min/{1.73_m2} — AB (ref >59)
GLOBULIN, TOTAL: 3 g/dL (ref 1.5–4.5)
GLUCOSE: 297 mg/dL — AB (ref 65–99)
Potassium: 4.2 mmol/L (ref 3.5–5.2)
SODIUM: 141 mmol/L (ref 134–144)
Total Protein: 7.2 g/dL (ref 6.0–8.5)

## 2015-09-23 LAB — CBC WITH DIFFERENTIAL/PLATELET
BASOS: 0 %
Basophils Absolute: 0 10*3/uL (ref 0.0–0.2)
EOS (ABSOLUTE): 0.2 10*3/uL (ref 0.0–0.4)
EOS: 3 %
HEMOGLOBIN: 13.1 g/dL (ref 11.1–15.9)
Hematocrit: 39.4 % (ref 34.0–46.6)
IMMATURE GRANS (ABS): 0 10*3/uL (ref 0.0–0.1)
IMMATURE GRANULOCYTES: 0 %
LYMPHS: 30 %
Lymphocytes Absolute: 2.5 10*3/uL (ref 0.7–3.1)
MCH: 32.3 pg (ref 26.6–33.0)
MCHC: 33.2 g/dL (ref 31.5–35.7)
MCV: 97 fL (ref 79–97)
MONOS ABS: 0.4 10*3/uL (ref 0.1–0.9)
Monocytes: 5 %
NEUTROS PCT: 62 %
Neutrophils Absolute: 5.1 10*3/uL (ref 1.4–7.0)
Platelets: 103 10*3/uL — ABNORMAL LOW (ref 150–379)
RBC: 4.05 x10E6/uL (ref 3.77–5.28)
RDW: 15.6 % — AB (ref 12.3–15.4)
WBC: 8.3 10*3/uL (ref 3.4–10.8)

## 2015-09-23 NOTE — Telephone Encounter (Signed)
Patient is calling because she needs to get a written Rx to [employer] to get a ramp at the entrance to her house. Please call the patient when it is ready to pick up at [phone].

## 2015-09-24 ENCOUNTER — Ambulatory Visit: Payer: BLUE CROSS/BLUE SHIELD

## 2015-09-24 ENCOUNTER — Telehealth: Payer: Self-pay

## 2015-09-24 VITALS — BP 124/85 | HR 88

## 2015-09-24 DIAGNOSIS — R2681 Unsteadiness on feet: Secondary | ICD-10-CM

## 2015-09-24 DIAGNOSIS — M6281 Muscle weakness (generalized): Secondary | ICD-10-CM

## 2015-09-24 DIAGNOSIS — I159 Secondary hypertension, unspecified: Secondary | ICD-10-CM

## 2015-09-24 DIAGNOSIS — R2689 Other abnormalities of gait and mobility: Secondary | ICD-10-CM

## 2015-09-24 LAB — QUANTIFERON IN TUBE
QFT TB AG MINUS NIL VALUE: 0.01 [IU]/mL
QUANTIFERON MITOGEN VALUE: 8.36 [IU]/mL
QUANTIFERON NIL VALUE: 0.02 [IU]/mL
QUANTIFERON TB AG VALUE: 0.03 [IU]/mL
QUANTIFERON TB GOLD: NEGATIVE

## 2015-09-24 LAB — QUANTIFERON TB GOLD ASSAY (BLOOD)

## 2015-09-24 NOTE — Telephone Encounter (Signed)
A prescription was written for a ramp.

## 2015-09-24 NOTE — Telephone Encounter (Signed)
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  Do see occasional readings WNL, but mostly 160-100 systolic.   Her last renal doppler was in 2008.  Could we repeat that now to see if this might be part of her problem?  Belenda Cruise

## 2015-09-24 NOTE — Therapy (Signed)
[facility] [address] [place], [place], [address] Phone: [phone]   Fax:  [phone]  Physical Therapy Treatment  Patient Details  Name: [name] Fry MRN: [id number] Date of Birth: 1950/12/12 Referring Provider: Ward Givens, NP  Encounter Date: 09/24/2015      PT End of Session - 09/24/15 1557    Visit Number 12   Number of Visits 17   Date for PT Re-Evaluation 09/23/15   Authorization Type BCBS   PT Start Time 9480   PT Stop Time 1445   PT Time Calculation (min) 41 min   Equipment Utilized During Treatment Gait belt   Activity Tolerance Patient limited by fatigue   Behavior During Therapy [facility] for tasks assessed/performed      Past Medical History  Diagnosis Date  . Diabetes mellitus   . Hypertension 05/07/2012    Normal 2D Echo; Renal Arterial Doppler 02/02/2007 - less than 60% diameter reduction, left proximal renal artery  . Asthma   . COPD (chronic obstructive pulmonary disease) ([place])   . Multiple sclerosis ([place])   . Goiter   . Hypertensive hypertrophic cardiomyopathy, by echo 05/07/12 05/08/2012  . Dyspnea 12/12/2005    CHF and Unspecified Chest pain - Myocardial Perfusion - post stress EF-72, negative for ischemia  . Hx of cardiac catheterization     normal cardiac cath  . Chronic diastolic HF (heart failure) ([place]) 10/17/2012  . H/O cardiac catheterization 10/17/2012  . Hypothyroidism   . Depression   . Dyslipidemia   . Chronic low back pain   . Peripheral edema   . Neurogenic bladder   . Obesity   . Gait disorder   . GERD (gastroesophageal reflux disease)   . Arthritis   . Complication of anesthesia     HAS ASTHMA FLARES AFTER MOST SURGERIES  . Pneumonia DEC 2013    Past Surgical History  Procedure Laterality Date  . Cardiac catheterization Left 05/06/2012    Moderate caliber vessel from mid LAD  . Cardiac catheterization Right 04/02/2007    Left ventricular hypertrophy, no  coronary disease, no renal artery stenosis  . Cesarean section       3x -[id number] AND 1985  . Cholecystectomy      1973  . Breast surgery      2 tumors removed -right breast  . Appendectomy      1973  . Arthoscopic rotaor cuff repair Right 1995  . Joint replacement       right shoulder 1985   . Thyroidectomy N/A 11/01/2013    Procedure: THYROIDECTOMY;  Surgeon: Odis Hollingshead, MD;  Location: [facility] ORS;  Service: General;  Laterality: N/A;  . Left heart catheterization with coronary angiogram N/A 05/07/2012    Procedure: LEFT HEART CATHETERIZATION WITH CORONARY ANGIOGRAM;  Surgeon: Leonie Man, MD;  Location: [facility] CATH LAB;  Service: Cardiovascular;  Laterality: N/A;    Filed Vitals:   09/24/15 1409 09/24/15 1441  BP: 143/94 124/85  Pulse: 84 88        Subjective Assessment - 09/24/15 1409    Subjective Pt denied falls since last visit. Pt reported she saw Dr. Jannifer Franklin yesterday and he changed her MS medication but she's not sure what the name is.  Pt reported she does have any new lesions. Pt reported BP was 222/125 yesterday evening, took BP meds and BP decr. to 125/98 (after 30 minutes) 143/98 and HR 77bpm this morning.    Patient is accompained by: Family member  spouse in lobby   Pertinent History Uncontrolled HTN- monitor BP (md orders- pt may continue therapy as long as BP < 200/110   Patient Stated Goals Walk without blood pressure getting too high   Currently in Pain? Yes   Pain Score 9    Pain Location Back   Pain Orientation Right;Lower   Pain Descriptors / Indicators Aching;Sharp;Stabbing   Pain Type Chronic pain   Pain Radiating Towards LEs and feet   Pain Onset More than a month ago   Pain Frequency Constant   Aggravating Factors  putting clothes on   Pain Relieving Factors "mash on it" (apply pressure)                         [facility] Adult PT Treatment/Exercise - 09/24/15 1417    Ambulation/Gait   Ambulation/Gait Yes   Ambulation/Gait  Assistance 5: Supervision   Ambulation/Gait Assistance Details Pt demonstrated improved stride length during first 100' of amb but then decr. 2/2 to fatigue, but speed is still decr. Pt required seated rest breaks 2/2 fatigue.    Ambulation Distance (Feet) 75 Feet  125', 10'x2   Assistive device Rollator   Gait Pattern Step-through pattern;Decreased stride length   Ambulation Surface Level;Indoor   Gait velocity 1.30ft/sec.   Standardized Balance Assessment   Standardized Balance Assessment Berg Balance Test;Timed Up and Go Test   Berg Balance Test   Sit to Stand Able to stand without using hands and stabilize independently   Standing Unsupported Able to stand safely 2 minutes   Sitting with Back Unsupported but Feet Supported on Floor or Stool Able to sit safely and securely 2 minutes   Stand to Sit Sits safely with minimal use of hands   Transfers Able to transfer safely, minor use of hands   Standing Unsupported with Eyes Closed Able to stand 10 seconds safely   Standing Ubsupported with Feet Together Able to place feet together independently and stand for 1 minute with supervision   From Standing, Reach Forward with Outstretched Arm Can reach confidently >25 cm (10")   From Standing Position, Pick up Object from Floor Able to pick up shoe, needs supervision   From Standing Position, Turn to Look Behind Over each Shoulder Looks behind one side only/other side shows less weight shift   Turn 360 Degrees Able to turn 360 degrees safely but slowly   Standing Unsupported, Alternately Place Feet on Step/Stool Able to complete >2 steps/needs minimal assist   Standing Unsupported, One Foot in Front Able to take small step independently and hold 30 seconds   Standing on One Leg Tries to lift leg/unable to hold 3 seconds but remains standing independently   Total Score 43   Timed Up and Go Test   TUG Normal TUG   Normal TUG (seconds) 24  and 21.0sec. with rollator      Pt required frequent  seated rest breaks during BERG 2/2 fatigue and lightheadedness. Pt reported she hadn't eaten lunch yet, PT offered pt crackers but pt declined. Pt did accept cup of water. Vitals WNL during session.          PT Education - 09/24/15 1555    Education provided Yes   Education Details PT discussed goal progress and outcome measure results. PT discussed d/c from PT at this time, due to maximizing potential functional gains, pt agreeable. PT educated the importance of continuing HEP and walking. PT reiterated the importance of continuing to monitor  BP and HR and to go to ED if >200/100 with sx's.    Person(s) Educated Patient;Spouse   Methods Explanation   Comprehension Verbalized understanding          PT Short Term Goals - 09/24/15 1600    PT SHORT TERM GOAL #1   Title Pt will verbalize fall prevention strategies to decrease falls risk. Target date: 09/23/15   Status On-going   PT SHORT TERM GOAL #2   Title Pt will improve gait speed to >/=1.45f/sec with LRAD to decrease falls risk. Target date: 07/31/15   Status Achieved   PT SHORT TERM GOAL #3   Title Perform BERG and write goal if appropriate. Target date: 07/31/15   Status Achieved   PT SHORT TERM GOAL #4   Title Pt will amb. 300' with LRAD over even terrain at MOD I level to improve functional mobillty. Target date: 07/31/15   Status Achieved   PT SHORT TERM GOAL #5   Title Pt will improve BERG score to 40/56 to decr. falls risk. Target date: 09/23/15   Status Achieved           PT Long Term Goals - 09/24/15 1600    PT LONG TERM GOAL #1   Title Pt will improve gait speed to >/=1.850fsec with LRAD to reduce falls risk. Target date: 09/23/15   Status Partially Met   PT LONG TERM GOAL #2   Title Pt will amb. 500' over even/paved surfaces with LRAD at MOD I level to improve functional mobility. Target date: 09/23/15   Status Not Met   PT LONG TERM GOAL #3   Title Pt will improve TUG time with LRAD to </=20 seconds to reduce  falls risk.Target date: 09/23/15   Status Partially Met   PT LONG TERM GOAL #4   Title Pt will report zero falls over the last 2 weeks to improve safety during functional mobilty. Target date: 08/28/15   Status Achieved   PT LONG TERM GOAL #5   Title Pt will ascend/descend 4 steps with HHA in order to safely traverse steps at home. Target date: 09/23/15   Status Deferred  per pt request   PT LONG TERM GOAL #6   Title Pt will improve BERG score to >/=44/56 to decr. falls risk. Target date:09/23/15   Status Partially Met               Plan - 09/24/15 1558    Clinical Impression Statement Pt demonstrated progress as she met LTG 4, pt partially met LTGs 1, 3, and 6. Pt did not meet LTG 2 and declined to perform LTG 5 (steps) as she reports she's having a ramp put in at home and was too tired to try steps today. Pt pleased with current functional level and PT feels pt has maximized functional gains in PT, please see d/c summary for details.      Patient will benefit from skilled therapeutic intervention in order to improve the following deficits and impairments:     Visit Diagnosis: Other abnormalities of gait and mobility  Unsteadiness on feet  Muscle weakness (generalized)     Problem List Patient Active Problem List   Diagnosis Date Noted  . Cold feet 05/07/2014  . Cough 02/28/2014  . 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 Chronic diastolic HF (heart failure) ([place]05/14/2014  . HTN (hypertension) 10/17/2012  . H/O cardiac catheterization, 05/2012 with normal coronary arteries 10/17/2012  . Dysphagia 05/13/2012  . Pneumonia 05/11/2012  .  Leukocytosis 05/10/2012  . NSTEMI, Type 2- Troponin 1.4 05/09/2012  . Goiter, chronic 05/09/2012  . Hypertensive hypertrophic cardiomyopathy, by echo 05/07/12 05/08/2012  . Acute respiratory failure with hypoxia ([facility]) 05/06/2012  . Acute diastolic CHF (congestive heart failure) ([facility]) 05/06/2012  . Morbid obesity ([facility]) 05/06/2012   . Normal coronary arteries by cath 2008 & 12/ 2013; Normal EF;  LVH on Echo   05/06/2012  . Acute on chronic diastolic HF (heart failure) ([facility]) 05/06/2012  . HTN (hypertension), malignant 05/06/2012  . Community acquired pneumonia 10/20/2011  . Uncontrolled hypertension 10/20/2011  . Diabetes mellitus type 2, uncontrolled, with complications ([facility]) 10/20/2011  . Hyperlipidemia 10/20/2011  . Multiple sclerosis ([facility]) 10/20/2011  . Abnormality of gait 04/21/2011  . Pain in limb 04/21/2011  . Encounter for therapeutic drug monitoring 04/21/2011    Lakynn Halvorsen L 09/24/2015, 4:01 PM  [facility] [address] [place], [place], [address] Phone: [phone]   Fax:  [phone]  Name: [name] Fry MRN: [id number] Date of Birth: 1950-10-07  PHYSICAL THERAPY DISCHARGE SUMMARY  Visits from Start of Care: 12  Current functional level related to goals / functional outcomes:     PT Short Term Goals - 09/24/15 1600    PT SHORT TERM GOAL #1   Title Pt will verbalize fall prevention strategies to decrease falls risk. Target date: 09/23/15   Status On-going   PT SHORT TERM GOAL #2   Title Pt will improve gait speed to >/=1.66ft/sec with LRAD to decrease falls risk. Target date: 07/31/15   Status Achieved   PT SHORT TERM GOAL #3   Title Perform BERG and write goal if appropriate. Target date: 07/31/15   Status Achieved   PT SHORT TERM GOAL #4   Title Pt will amb. 300' with LRAD over even terrain at MOD I level to improve functional mobillty. Target date: 07/31/15   Status Achieved   PT SHORT TERM GOAL #5   Title Pt will improve BERG score to 40/56 to decr. falls risk. Target date: 09/23/15   Status Achieved         PT Long Term Goals - 09/24/15 1600    PT LONG TERM GOAL #1   Title Pt will improve gait speed to >/=1.43ft/sec with LRAD to reduce falls risk. Target date: 09/23/15   Status Partially Met   PT LONG TERM GOAL #2    Title Pt will amb. 500' over even/paved surfaces with LRAD at MOD I level to improve functional mobility. Target date: 09/23/15   Status Not Met   PT LONG TERM GOAL #3   Title Pt will improve TUG time with LRAD to </=20 seconds to reduce falls risk.Target date: 09/23/15   Status Partially Met   PT LONG TERM GOAL #4   Title Pt will report zero falls over the last 2 weeks to improve safety during functional mobilty. Target date: 08/28/15   Status Achieved   PT LONG TERM GOAL #5   Title Pt will ascend/descend 4 steps with HHA in order to safely traverse steps at home. Target date: 09/23/15   Status Deferred  per pt request   PT LONG TERM GOAL #6   Title Pt will improve BERG score to >/=44/56 to decr. falls risk. Target date:09/23/15   Status Partially Met        Remaining deficits: Decreased endurance, strength, gait deviations when fatigued.   Education / Equipment: HEP  Plan: Patient agrees to discharge.  Patient goals were  partially met. Patient is being discharged due to being pleased with the current functional level.  ?????       Geoffry Paradise, PT,DPT 09/24/2015 4:01 PM Phone: [phone] Fax: [phone]

## 2015-09-24 NOTE — Telephone Encounter (Signed)
Rx printed, signed, up front for pick-up. Called to notify pt. She also asked about new rx: Tecfidera. Start form faxed to [phone].

## 2015-09-30 ENCOUNTER — Telehealth: Payer: Self-pay | Admitting: Surgery

## 2015-09-30 DIAGNOSIS — I5032 Chronic diastolic (congestive) heart failure: Secondary | ICD-10-CM

## 2015-09-30 NOTE — Telephone Encounter (Signed)
Pt calling c/o swelling in ankles, legs cold x  1 week wants appt today-pls advise

## 2015-09-30 NOTE — Telephone Encounter (Signed)
Returned call to pt. She stated that her legs have increased swelling for the past 2 weeks. She describes them as being the "size of a donut and a half and wrinkly." She says her husband's fingers leave an indention in the edema. She's had a cough for about 1 week. Pain in her chest when she tries to lay down, SOB with walking, standing or using the restroom. States her urine output has decreased. BP last hs 197/115  Hr 82, today BP 232/122, HR 85.  Pt does not take daily weights.  These findings taken to Dr Sw[place], DOD, for review. Dr [place] advised to increase furosemide to  by mouth bid for 3 days, then take 80 mg by mouth daily. Have pt get a BMET in one week and f/u in the office in 1 week. Pt appt made for 10/07/15. Pt made aware of orders and verbalized understanding.  Pt verbalized understanding to call the office if her symptoms do not improve. Pt verbalized understanding to call 911 or go to the emergency room if she develops any new or worsening symptoms.

## 2015-10-06 ENCOUNTER — Telehealth: Payer: Self-pay

## 2015-10-06 ENCOUNTER — Ambulatory Visit (HOSPITAL_COMMUNITY)
Admission: RE | Admit: 2015-10-06 | Discharge: 2015-10-06 | Disposition: A | Discharge status: Home / Self Care | Payer: BLUE CROSS/BLUE SHIELD | Source: Ambulatory Visit | Attending: Cardiology | Admitting: Cardiology

## 2015-10-06 DIAGNOSIS — I701 Atherosclerosis of renal artery: Secondary | ICD-10-CM | POA: Insufficient documentation

## 2015-10-06 DIAGNOSIS — I159 Secondary hypertension, unspecified: Secondary | ICD-10-CM | POA: Diagnosis present

## 2015-10-06 DIAGNOSIS — E119 Type 2 diabetes mellitus without complications: Secondary | ICD-10-CM | POA: Insufficient documentation

## 2015-10-06 DIAGNOSIS — I10 Essential (primary) hypertension: Secondary | ICD-10-CM | POA: Diagnosis not present

## 2015-10-06 DIAGNOSIS — I110 Hypertensive heart disease with heart failure: Secondary | ICD-10-CM | POA: Diagnosis not present

## 2015-10-06 DIAGNOSIS — K219 Gastro-esophageal reflux disease without esophagitis: Secondary | ICD-10-CM | POA: Diagnosis not present

## 2015-10-06 DIAGNOSIS — I5032 Chronic diastolic (congestive) heart failure: Secondary | ICD-10-CM | POA: Diagnosis not present

## 2015-10-06 DIAGNOSIS — E785 Hyperlipidemia, unspecified: Secondary | ICD-10-CM | POA: Insufficient documentation

## 2015-10-06 NOTE — Telephone Encounter (Signed)
-----   Message from Runell Gess, MD sent at 10/06/2015  1:14 PM EDT ----- Slight progression of RRAS. Repeat 12 months

## 2015-10-07 ENCOUNTER — Encounter: Payer: Self-pay | Admitting: Cardiology

## 2015-10-07 ENCOUNTER — Telehealth: Payer: Self-pay | Admitting: Neurology

## 2015-10-07 ENCOUNTER — Ambulatory Visit (INDEPENDENT_AMBULATORY_CARE_PROVIDER_SITE_OTHER): Payer: BLUE CROSS/BLUE SHIELD | Admitting: Cardiology

## 2015-10-07 VITALS — BP 142/88 | HR 82 | Ht 61.0 in | Wt 221.0 lb

## 2015-10-07 DIAGNOSIS — Z0389 Encounter for observation for other suspected diseases and conditions ruled out: Secondary | ICD-10-CM

## 2015-10-07 DIAGNOSIS — I10 Essential (primary) hypertension: Secondary | ICD-10-CM | POA: Diagnosis not present

## 2015-10-07 DIAGNOSIS — Z87448 Personal history of other diseases of urinary system: Secondary | ICD-10-CM

## 2015-10-07 DIAGNOSIS — I5032 Chronic diastolic (congestive) heart failure: Secondary | ICD-10-CM

## 2015-10-07 DIAGNOSIS — I5033 Acute on chronic diastolic (congestive) heart failure: Secondary | ICD-10-CM | POA: Diagnosis not present

## 2015-10-07 DIAGNOSIS — IMO0001 Reserved for inherently not codable concepts without codable children: Secondary | ICD-10-CM

## 2015-10-07 DIAGNOSIS — Z8679 Personal history of other diseases of the circulatory system: Secondary | ICD-10-CM

## 2015-10-07 NOTE — Assessment & Plan Note (Signed)
She has had difficult to control HTN and is on multiple medications. Currently satble

## 2015-10-07 NOTE — Progress Notes (Signed)
10/07/2015 BEDA DULA   1950/07/22  [id number]  Primary Physician Foye Spurling, MD Primary Cardiologist: Dr Gwenlyn Found  HPI:  65 year old obese, married, African American female, followed by Dr Gwenlyn Found.  She has a history of normal coronary arteries by catheterization back in 2008 as well as most recently May 07, 2012, by Dr. Ellyn Hack. She has severe hypertension, diabetes, and hyperlipidemia. She has moderate RAS and had dopplers yesterday, plan is continued medical Rx. She is in the office today for follow up after she recently had increased LE edema and HTN. Her Lasix was increased for a few days with good result. Her B/P is also better today though I don't believe her anti hypertensives were adjusted. She has several other somatic complaints- tingling in her hands, localized chest discomfort, and back pain.    Current Outpatient Prescriptions  Medication Sig Dispense Refill  . amLODipine (NORVASC) 10 MG tablet Take 1 tablet by mouth  daily 90 tablet 2  . aspirin EC 81 MG tablet Take 81 mg by mouth every morning.    Marland Kitchen atorvastatin (LIPITOR) 20 MG tablet Take 1 tablet by mouth at  bedtime 90 tablet 2  . AVONEX PREFILLED 30 MCG/0.5ML PSKT injection Inject intramuscularly  49mg every week. 1 kit 3  . cloNIDine (CATAPRES) 0.2 MG tablet Take 1 tablet by mouth twice daily and extra daily dose for BP >170/100 225 tablet 1  . EDARBI 80 MG TABS Take 1 tablet by mouth  daily 90 tablet 2  . ergocalciferol (VITAMIN D2) 50000 UNITS capsule Take 50,000 Units by mouth 2 (two) times a week.    . fluticasone (FLONASE) 50 MCG/ACT nasal spray Place 2 sprays into the nose daily. 1 g 0  . fluticasone (FLOVENT HFA) 220 MCG/ACT inhaler Inhale 2 puffs into the lungs 2 (two) times daily.    . furosemide (LASIX) 40 MG tablet Take 1 tablet by mouth  daily 90 tablet 3  . hydrALAZINE (APRESOLINE) 100 MG tablet Take 1 tablet by mouth 3  times daily 270 tablet 3  . insulin lispro protamine-lispro (HUMALOG 75/25  MIX) (75-25) 100 UNIT/ML SUSP injection Inject 60 Units into the skin every morning.    .Marland Kitchenliothyronine (CYTOMEL) 25 MCG tablet Take 50 mcg by mouth 2 (two) times daily.    . metoprolol (LOPRESSOR) 100 MG tablet Take 1 tablet by mouth two  times daily 180 tablet 2  . pantoprazole (PROTONIX) 40 MG tablet Take 1 tablet by mouth  daily 90 tablet 2  . pregabalin (LYRICA) 75 MG capsule Take 75 mg by mouth at bedtime. Reported on 09/22/2015    . spironolactone (ALDACTONE) 25 MG tablet Take 2 tablets (50 mg total) by mouth daily. 60 tablet 3  . traZODone (DESYREL) 100 MG tablet Take 2 tablets by mouth at  bedtime 180 tablet 0   No current facility-administered medications for this visit.    Allergies  Allergen Reactions  . Codeine Other (See Comments)    PASS OUT  . Other     Poppy seeds.- EYES SWELL AND HIVES  . Penicillins Other (See Comments)    "PASS OUT, MESSES WITH MY HEART RATE"  . Tape     Pink tape-RASH AND ITCHING    Social History   Social History  . Marital Status: Married    Spouse Name: JCharlotte Crumb . Number of Children: 3  . Years of Education: 14   Occupational History  . Disabled    Social History Main Topics  .  Smoking status: Former Smoker -- 1.00 packs/day for 20 years    Types: Cigarettes    Quit date: 10/24/2003  . Smokeless tobacco: Never Used  . Alcohol Use: No  . Drug Use: No  . Sexual Activity: Not on file   Other Topics Concern  . Not on file   Social History Narrative   Patient is married Biomedical engineer) and lives with her husband and her sister-in-law.   Patient has three children.   Patient is disabled.   Patient has a college education.   Patient is right-handed.   Patient does not drink any caffeine.     Review of Systems: General: negative for chills, fever, night sweats or weight changes.  Cardiovascular: negative for chest pain, dyspnea on exertion, edema, orthopnea, palpitations, paroxysmal nocturnal dyspnea or shortness of  breath Dermatological: negative for rash Respiratory: negative for cough or wheezing Urologic: negative for hematuria Abdominal: negative for nausea, vomiting, diarrhea, bright red blood per rectum, melena, or hematemesis Neurologic: negative for visual changes, syncope, or dizziness All other systems reviewed and are otherwise negative except as noted above.    Blood pressure 142/88, pulse 82, height '5\' 1"'$  (1.549 m), weight 221 lb (100.245 kg).  General appearance: alert, cooperative, no distress and morbidly obese Lungs: clear to auscultation bilaterally Heart: regular rate and rhythm Extremities: trace edema Neurologic: Grossly normal  EKG NSR with TWI similar to previous EKGs  ASSESSMENT AND PLAN:   Acute on chronic diastolic HF (heart failure) Pt recently had increased LE edema and required extra Lasix for a few days.  She tells me she did with a HHRN following her for CHF but this was stopped in Jan 2017 when her insurance changed  Normal coronary arteries by cath 2008 & 12/ 2013; Normal EF;  LVH on Echo   Diastolic CHF  Morbid obesity BMI 41  HTN (hypertension), malignant She has had difficult to control HTN and is on multiple medications. Currently satble   PLAN  She asked if she could be enrolled in the [facility] CHF program and I think this would be a good idea, otherwise same Rx for now. F/U Dr Gwenlyn Found 3 months.   Kerin Ransom K PA-C 10/07/2015 3:27 PM

## 2015-10-07 NOTE — Telephone Encounter (Signed)
[facility] [phone] calling inquiring if PA forms for tecfidera have been faxed to insurance company for processing?

## 2015-10-07 NOTE — Assessment & Plan Note (Signed)
Diastolic CHF

## 2015-10-07 NOTE — Assessment & Plan Note (Signed)
Pt recently had increased LE edema and required extra Lasix for a few days.  She tells me she did with a HHRN following her for CHF but this was stopped in Jan 2017 when her insurance changed

## 2015-10-07 NOTE — Patient Instructions (Signed)
Medication Instructions:  Your physician recommends that you continue on your current medications as directed. Please refer to the Current Medication list given to you today.  Labwork: none  Testing/Procedures: none  Follow-Up: Your physician recommends that you schedule a follow-up appointment in: Dr Allyson Sabal in 3 months   Any Other Special Instructions Will Be Listed Below (If Applicable). CALL YOUR INSURANCE AND SEE IF THEY OFFER AN OUTPATIENT HEART FAILURE PROGRAM WHERE THEY WILL SUPPLY YOU WITH SCALES AND HELP MONITOR YOU. IF NOT CALL THE OFFICE BACK AND LET us KNOW.   If you need a refill on your cardiac medications before your next appointment, please call your pharmacy.

## 2015-10-07 NOTE — Assessment & Plan Note (Signed)
BMI 41 

## 2015-10-08 ENCOUNTER — Other Ambulatory Visit: Payer: Self-pay

## 2015-10-08 NOTE — Telephone Encounter (Signed)
PA submitted through covermymeds, awaiting approval/denial.

## 2015-10-09 ENCOUNTER — Telehealth: Payer: Self-pay | Admitting: *Deleted

## 2015-10-09 NOTE — Telephone Encounter (Signed)
Patient was wanting to get enrolled in an outpatient CHF program like what her [facility] insurance plan used to do for her Did reach out to HF clinic and they were unaware of any programs She will have to just get scales and weigh herself daily as discussed at ov Left message for patient to call back

## 2015-10-16 NOTE — Telephone Encounter (Signed)
PA approved effective from 10/08/2015 through 06/05/2038

## 2015-10-16 NOTE — Addendum Note (Signed)
Addended by: Donnelly Angelica on: 10/16/2015 01:31 PM   Modules accepted: Medications

## 2015-10-16 NOTE — Telephone Encounter (Signed)
Patient is calling to advise she did receive her Tecfidera in the mail. She would like to know when she should start taking this medication. Please call and advise.

## 2015-10-16 NOTE — Addendum Note (Signed)
Addended by: Donnelly Angelica on: 10/16/2015 12:14 PM   Modules accepted: Orders, Medications

## 2015-10-16 NOTE — Telephone Encounter (Addendum)
Returned pt TC. Says that she took her last Avonex injection a week ago today, should she "start Tecfidera today or tomorrow?" Told her that she could start 120 mg this evening and twice a day for a week before going up to 240 mg. She verbalized understanding and appreciation for call.

## 2015-10-20 NOTE — Telephone Encounter (Signed)
Advised patient she would need to get her own scales Patient does continue to have swelling in her feet although they are a little better Does have shortness of breath on exertion Takes Celebrex twice a day for pain in her legs and feet She continues to have problems with her blood pressure being elevated.  Yesterday 184/107 HR 91, 197/113 HR 91, and today 183/110 Patients medications reviewed with her and states she has had her blood pressure medications today and an extra Clonidine  Discussed with Penni Bombard D who she has been seeing for her blood pressure and will continue current regimen and keep appointment 10/22/15 Advised patient   Will forward to Dr Allyson Sabal regarding the swelling she continues to have Again advised patient would need to get scales to weigh herself daily

## 2015-10-20 NOTE — Telephone Encounter (Signed)
Follow up ° ° ° ° °Returning a call to the nurse from days ago. °

## 2015-10-21 NOTE — Telephone Encounter (Signed)
Arrange for pt to see MLP for swelling

## 2015-10-22 ENCOUNTER — Ambulatory Visit (INDEPENDENT_AMBULATORY_CARE_PROVIDER_SITE_OTHER): Payer: BLUE CROSS/BLUE SHIELD | Admitting: Pharmacist Clinician (PhC)/ Clinical Pharmacy Specialist

## 2015-10-22 ENCOUNTER — Encounter: Payer: Self-pay | Admitting: Pharmacist Clinician (PhC)/ Clinical Pharmacy Specialist

## 2015-10-22 VITALS — BP 134/90 | HR 80 | Ht 61.0 in | Wt 217.4 lb

## 2015-10-22 DIAGNOSIS — I10 Essential (primary) hypertension: Secondary | ICD-10-CM | POA: Diagnosis not present

## 2015-10-22 NOTE — Progress Notes (Signed)
10/22/2015 [name] Fry 1951-03-11 [id number]   HPI:  [name] Fry is a 65 y.o. female patient of Dr Allyson Sabal, with a PMH below who presents today for return hypertension clinic visit.  I usually see her about once each quarter, as her BP tends to fluctuate regularly.  Her history is significant for hypertension, hyperlipidemia and diabetes. She also has MS and recently has been having more muscle weakness, falling 3 times in a 2 week period at the end of February.  Her MS medication has recently been switched and she reports no recent falls or worsening of weakness.   Repeated cardiac catheterizations in 2008 and in 2013 showed normal coronary arteries.  A renal artery scan in 2008 was read as normal.  She was treated in December 2013 at [facility] for hypertensive emergency and pneumonia.   She has very labile blood pressures and we've had her on maximum therapy for some time.  She continues with the edarby 80 mg daily, amlodipine 10 mg daily, hydralazine 100 mg bid, clonidine 0.2 mg bid, metoprolol 100 mg bid, and spironolactone 50 mg qd.  She states compliance with all medications.  Her sleep habits are poor, probably related to the MS.  She sleeps in her "computer chair" from 7 pm until about 7 am, not sleeping much, just dozing.  Around 7 am she climbs into bed and will sleep (soundly per her husband) for about 2-3 hours.  Then she is up again.  She also admits to napping during the day.    She quit smoking in 2005 and does not drink any alcohol or caffeine.  She has been aware of the need for salt restriction for some time and believes she is doing well with this, using Ms Sharilyn Sites and avoiding high sodium foods.  She has been encouraged to walk more, but has not done much recently due to increased weakness and recent falls.  Overall she has dropped 27 pounds in the past year and she continues to work at decreasing her weight.  Her diabetes is not well controlled, although no A1c is available, her  glucose tends to run in the 200s quite regularly.    She is unaware of any family history of cardiac disease, as she was adopted.  She has 3 children, all in their 30s, and none have cardiac concerns at this point.    She has a morning exercise routine which involves walking thru the house.   At times she has walked laps around the [facility], but has not done so recently.  Her husband tries to push her into doing more, but she gets weepy and states she is doing all she can.  Home readings continue to  vary from 120-200/80-120. Only 13/45 readings in the past 6 weeks were < 150 systolic at home.       Current Outpatient Prescriptions  Medication Sig Dispense Refill  . amLODipine (NORVASC) 10 MG tablet Take 1 tablet by mouth  daily 90 tablet 2  . aspirin EC 81 MG tablet Take 81 mg by mouth every morning.    Marland Kitchen atorvastatin (LIPITOR) 20 MG tablet Take 1 tablet by mouth at  bedtime 90 tablet 2  . Celecoxib (CELEBREX PO) Take by mouth daily.    . cloNIDine (CATAPRES) 0.2 MG tablet Take 1 tablet by mouth twice daily and extra daily dose for BP >170/100 225 tablet 1  . Dimethyl Fumarate (TECFIDERA) 120 & 240 MG MISC Take by mouth.    Marland Kitchen  EDARBI 80 MG TABS Take 1 tablet by mouth  daily 90 tablet 2  . ergocalciferol (VITAMIN D2) 50000 UNITS capsule Take 50,000 Units by mouth 2 (two) times a week.    . fluticasone (FLONASE) 50 MCG/ACT nasal spray Place 2 sprays into the nose daily. 1 g 0  . fluticasone (FLOVENT HFA) 220 MCG/ACT inhaler Inhale 2 puffs into the lungs 2 (two) times daily.    . furosemide (LASIX) 40 MG tablet Take 1 tablet by mouth  daily 90 tablet 3  . hydrALAZINE (APRESOLINE) 100 MG tablet Take 1 tablet by mouth 3  times daily 270 tablet 3  . insulin lispro protamine-lispro (HUMALOG 75/25 MIX) (75-25) 100 UNIT/ML SUSP injection Inject 60 Units into the skin every morning.    Marland Kitchen liothyronine (CYTOMEL) 25 MCG tablet Take 50 mcg by mouth 2 (two) times daily.    . metoprolol (LOPRESSOR) 100  MG tablet Take 1 tablet by mouth two  times daily 180 tablet 2  . pantoprazole (PROTONIX) 40 MG tablet Take 1 tablet by mouth  daily 90 tablet 2  . pregabalin (LYRICA) 75 MG capsule Take 75 mg by mouth at bedtime. Reported on 09/22/2015    . spironolactone (ALDACTONE) 25 MG tablet Take 2 tablets (50 mg total) by mouth daily. 60 tablet 3  . traZODone (DESYREL) 100 MG tablet Take 2 tablets by mouth at  bedtime 180 tablet 0   No current facility-administered medications for this visit.    Allergies  Allergen Reactions  . Codeine Other (See Comments)    PASS OUT  . Other     Poppy seeds.- EYES SWELL AND HIVES  . Penicillins Other (See Comments)    "PASS OUT, MESSES WITH MY HEART RATE"  . Tape     Pink tape-RASH AND ITCHING    Past Medical History  Diagnosis Date  . Diabetes mellitus   . Hypertension 05/07/2012    Normal 2D Echo; Renal Arterial Doppler 02/02/2007 - less than 60% diameter reduction, left proximal renal artery  . Asthma   . COPD (chronic obstructive pulmonary disease) (HCC)   . Multiple sclerosis (HCC)   . Goiter   . Hypertensive hypertrophic cardiomyopathy, by echo 05/07/12 05/08/2012  . Dyspnea 12/12/2005    CHF and Unspecified Chest pain - Myocardial Perfusion - post stress EF-72, negative for ischemia  . Hx of cardiac catheterization     normal cardiac cath  . Chronic diastolic HF (heart failure) (HCC) 10/17/2012  . H/O cardiac catheterization 10/17/2012  . Hypothyroidism   . Depression   . Dyslipidemia   . Chronic low back pain   . Peripheral edema   . Neurogenic bladder   . Obesity   . Gait disorder   . GERD (gastroesophageal reflux disease)   . Arthritis   . Complication of anesthesia     HAS ASTHMA FLARES AFTER MOST SURGERIES  . Pneumonia DEC 2013    Blood pressure 134/90, pulse 80, height 5\' 1"  (1.549 m), weight 217 lb 6.4 oz (98.612 kg).     Phillips Hay PharmD CPP [facility]

## 2015-10-22 NOTE — Patient Instructions (Addendum)
Return for a a follow up appointment in 3 months  Take 40 mg furosemide twice daily for 2 days, then continue with once daily  Your blood pressure today is 134/90  Check your blood pressure at home daily and keep record of the readings.  Take your BP meds as follows: increase clonidine to three times daily, continue all other medications   Bring all of your meds, your BP cuff and your record of home blood pressures to your next appointment.  Exercise as you're able, try to walk approximately 30 minutes per day.  Keep salt intake to a minimum, especially watch canned and prepared boxed foods.  Eat more fresh fruits and vegetables and fewer canned items.  Avoid eating in fast food restaurants.    HOW TO TAKE YOUR BLOOD PRESSURE: . Rest 5 minutes before taking your blood pressure. .  Don't smoke or drink caffeinated beverages for at least 30 minutes before. . Take your blood pressure before (not after) you eat. . Sit comfortably with your back supported and both feet on the floor (don't cross your legs). . Elevate your arm to heart level on a table or a desk. . Use the proper sized cuff. It should fit smoothly and snugly around your bare upper arm. There should be enough room to slip a fingertip under the cuff. The bottom edge of the cuff should be 1 inch above the crease of the elbow. . Ideally, take 3 measurements at one sitting and record the average.

## 2015-10-22 NOTE — Assessment & Plan Note (Addendum)
Today her BP in the office looks good at 134/90.  When she saw Corine Shelter earlier this month she was at 142/88.  I last verified the accuracy of her home BP cuff about 18 months ago, and at that time it read 20 points higher than the office readings.  With that in mind, if I subtracted 20 points from all her home readings, half of the 65 would be WNL.  Today the only change I will make is to have her take the clonidine 0.2 mg three times daily every day, instead of twice daily with extra dose for elevated pressure - she was taking most days of the week anyway.  Will see her back in 3 months for follow up.  She also complains today of LEE.  Her ankles looked somewhat swollen, and she admits that they are not as bad as they have been, but they are uncomfortable.  Will have her increase her furosemide to 40 mg bid x 2 days, then resume with 40 mg once daily.  She has a scale at home and she was encouraged to get back to daily weights.

## 2015-10-23 NOTE — Telephone Encounter (Signed)
Called pt back again and she is aware of her appt.

## 2015-10-23 NOTE — Telephone Encounter (Signed)
No answer. Left detailed messaged stating we have an appointment lined up for next Wednesday, 10/28/15 at 9:30. Left message to call us back and confirm.

## 2015-10-28 ENCOUNTER — Ambulatory Visit (INDEPENDENT_AMBULATORY_CARE_PROVIDER_SITE_OTHER): Payer: BLUE CROSS/BLUE SHIELD | Admitting: Cardiology

## 2015-10-28 ENCOUNTER — Encounter: Payer: Self-pay | Admitting: Cardiology

## 2015-10-28 ENCOUNTER — Telehealth (HOSPITAL_COMMUNITY): Payer: Self-pay | Admitting: *Deleted

## 2015-10-28 VITALS — BP 160/80 | HR 74 | Ht 61.0 in | Wt 220.8 lb

## 2015-10-28 DIAGNOSIS — R9431 Abnormal electrocardiogram [ECG] [EKG]: Secondary | ICD-10-CM | POA: Diagnosis not present

## 2015-10-28 DIAGNOSIS — R079 Chest pain, unspecified: Secondary | ICD-10-CM

## 2015-10-28 DIAGNOSIS — M7989 Other specified soft tissue disorders: Secondary | ICD-10-CM

## 2015-10-28 LAB — BASIC METABOLIC PANEL
BUN: 13 mg/dL (ref 7–25)
CALCIUM: 9.5 mg/dL (ref 8.6–10.4)
CO2: 29 mmol/L (ref 20–31)
CREATININE: 1.2 mg/dL — AB (ref 0.50–0.99)
Chloride: 102 mmol/L (ref 98–110)
Glucose, Bld: 226 mg/dL — ABNORMAL HIGH (ref 65–99)
Potassium: 4.6 mmol/L (ref 3.5–5.3)
SODIUM: 142 mmol/L (ref 135–146)

## 2015-10-28 LAB — BRAIN NATRIURETIC PEPTIDE: BRAIN NATRIURETIC PEPTIDE: 69.2 pg/mL (ref <100)

## 2015-10-28 NOTE — Telephone Encounter (Signed)
Patient given detailed instructions per Myocardial Perfusion Study Information Sheet for the test on 11/03/15. Patient notified to arrive 15 minutes early and that it is imperative to arrive on time for appointment to keep from having the test rescheduled.  If you need to cancel or reschedule your appointment, please call the office within 24 hours of your appointment. Failure to do so may result in a cancellation of your appointment, and a $50 no show fee. Patient verbalized understanding. Nyeli Holtmeyer J Kyro Joswick, RN  

## 2015-10-28 NOTE — Progress Notes (Signed)
10/28/2015 [name] Fry   1951-05-20  [id number]  Primary Physician [name], MD Primary Cardiologist: [name]  Reason for Visit/CC: Multiple Complaints: mid scapular pain, epigastric pain, bilateral leg pain and bilateral LEE. ? Somatic Symptom Disorder    HPI:  The patient is a 65 year old female who appears older than stated age and is followed by [name], who presents to clinic with multiple complaints. She has a history of hypertension and chronic diastolic heart failure. She has also been evaluated in the past for CAD and workup has been negative. She has a history of normal coronary arteries by cardiac catheterization  In 2008 and again in December 2013. Her echocardiograms have also shown normal left ventricular systolic function. She also has a history of multiple sclerosis as well as diabetes with diabetic nerve pain. She was previously on Lyrica but this was discontinued by her PCP.  She presents to clinic today with complaints of upper back pain between her shoulder blades. She also has occasional chest tightness/ mid epigastric pain that occurs at rest. She notes mild dyspnea on exertion. She also complains of bilateral lower extremity edema. She reports full compliance with Lasix but has noticed little improvement.  She also reports bilateral leg pain originating in her low back and radiating down her thighs/ legs and into her toes. She describes sharp stabbing/prickling pain with occasional numbness and tingling bilaterally which seems more consistent with diabetic nerve pain. In 2015, she underwent bilateral lower arterial Dopplers which were negative for PVD. During physical exam today when I reached down to lighlty touch her legs to check for pitting edema, she screamed out in pain.   She was seen in clinic by Corine Shelter on 10/07/2015 for routine follow-up. An EKG was obtained at that time. This has been reviewed. It shows inferior and anterolateral T wave  abnormality/T-wave inversions which appear to be new compared to prior EKG in November 2016. She is currently chest pain-free in clinic today.   Current Outpatient Prescriptions  Medication Sig Dispense Refill  . amLODipine (NORVASC) 10 MG tablet Take 1 tablet by mouth  daily 90 tablet 2  . aspirin EC 81 MG tablet Take 81 mg by mouth every morning.    Marland Kitchen atorvastatin (LIPITOR) 20 MG tablet Take 1 tablet by mouth at  bedtime 90 tablet 2  . Celecoxib (CELEBREX PO) Take 200 mg by mouth 2 (two) times daily.     . cloNIDine (CATAPRES) 0.2 MG tablet Take 1 tablet by mouth twice daily and extra daily dose for BP >170/100 225 tablet 1  . Dimethyl Fumarate (TECFIDERA) 120 & 240 MG MISC Take by mouth.    . EDARBI 80 MG TABS Take 1 tablet by mouth  daily 90 tablet 2  . fluocinonide cream (LIDEX) 0.05 % Apply 1 application topically 2 (two) times daily.    . fluticasone (FLONASE) 50 MCG/ACT nasal spray Place 2 sprays into the nose daily. 1 g 0  . fluticasone (FLOVENT HFA) 220 MCG/ACT inhaler Inhale 2 puffs into the lungs 2 (two) times daily.    . furosemide (LASIX) 40 MG tablet Take 1 tablet by mouth  daily 90 tablet 3  . hydrALAZINE (APRESOLINE) 100 MG tablet Take 1 tablet by mouth 3  times daily 270 tablet 3  . insulin lispro protamine-lispro (HUMALOG 75/25 MIX) (75-25) 100 UNIT/ML SUSP injection Inject 60 Units into the skin every morning.    Marland Kitchen levothyroxine (SYNTHROID, LEVOTHROID) 175 MCG tablet Take 175  mcg by mouth daily before breakfast.    . liothyronine (CYTOMEL) 25 MCG tablet Take 50 mcg by mouth 2 (two) times daily.    . metoprolol (LOPRESSOR) 100 MG tablet Take 1 tablet by mouth two  times daily 180 tablet 2  . pantoprazole (PROTONIX) 40 MG tablet Take 1 tablet by mouth  daily 90 tablet 2  . spironolactone (ALDACTONE) 25 MG tablet Take 25 mg by mouth daily.    . traZODone (DESYREL) 100 MG tablet Take 2 tablets by mouth at  bedtime 180 tablet 0   No current facility-administered medications  for this visit.    Allergies  Allergen Reactions  . Codeine Other (See Comments)    PASS OUT  . Other     Poppy seeds.- EYES SWELL AND HIVES  . Penicillins Other (See Comments)    "PASS OUT, MESSES WITH MY HEART RATE"  . Tape     Pink tape-RASH AND ITCHING    Social History   Social History  . Marital Status: Married    Spouse Name: Bethann Berkshire  . Number of Children: 3  . Years of Education: 14   Occupational History  . Disabled    Social History Main Topics  . Smoking status: Former Smoker -- 1.00 packs/day for 20 years    Types: Cigarettes    Quit date: 10/24/2003  . Smokeless tobacco: Never Used  . Alcohol Use: No  . Drug Use: No  . Sexual Activity: Not on file   Other Topics Concern  . Not on file   Social History Narrative   Patient is married Company secretary) and lives with her husband and her sister-in-law.   Patient has three children.   Patient is disabled.   Patient has a college education.   Patient is right-handed.   Patient does not drink any caffeine.     Review of Systems: General: negative for chills, fever, night sweats or weight changes.  Cardiovascular: negative for chest pain, dyspnea on exertion, edema, orthopnea, palpitations, paroxysmal nocturnal dyspnea or shortness of breath Dermatological: negative for rash Respiratory: negative for cough or wheezing Urologic: negative for hematuria Abdominal: negative for nausea, vomiting, diarrhea, bright red blood per rectum, melena, or hematemesis Neurologic: negative for visual changes, syncope, or dizziness All other systems reviewed and are otherwise negative except as noted above.    Blood pressure 160/80, pulse 74, height 5\' 1"  (1.549 m), weight 220 lb 12.8 oz (100.154 kg), SpO2 98 %.  General appearance: alert, cooperative, appears older than stated age and dentition in poor repair Neck: no carotid bruit and no JVD Lungs: clear to auscultation bilaterally Heart: regular rate and rhythm, S1, S2  normal, no murmur, click, rub or gallop Extremities: dry skin coverning lower exteremities, mild bilateral pedial edema, nonpitting Pulses: 2+ and symmetric Skin: warm and dry Neurologic: Grossly normal  EKG not performed.  ASSESSMENT AND PLAN:   1. Mid Scapular Pain with Abnormal EKG: She describes recent history of mid scapular pain that is somewhat atypical. Sharp in nature however she does have a recent EKG which is abnormal from her baseline. This was performed on 10/07/2015 and showed new inferior/anterior lateral T-wave inversions/ST abnormalities concerning for ischemia. She also has multiple risk factors including diabetes and hypertension. She does have a history of normal coronary arteries by evaluation of left heart catheterization in 2008 and 2013. It has been 4 years since her last ischemic evaluation. Given her symptoms, abnormal EKG and risk factors, we will risk stratify with a  Lexiscan nuclear stress test to rule out coronary ischemia.  2. Bilateral LEE: She has a history of chronic diastolic heart failure. She does have mild bilateral pedal edema however nonpitting. The remainder of her physical exam is unremarkable. We will check a BNP  today to assess whether or not her edema is secondary to volume overload from acute on chronic diastolic heart failure. Patient was encouraged to elevate her legs while at home.   3. 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 This is followed by her PCP and she was previously treated with Lyrica which was discontinued, for reasons unknown. She also has reported history of multiple sclerosis.  I do not believe her symptoms are related to a vascular issue. She was evaluated with bilateral arterial Dopplers in 2015 which were normal without evidence of peripheral vascular disease. She has 2+ distal pulses. Low suspicion for DVT. No further cardiovascular workup indicated at  this time. Patient requested prescription for pain medication. She was instructed to follow-up with her PCP. ? Somatic Symptom Disorder vs pain seeking.   PLAN  We will f/u on results of stress test and BNP and will notify patient of results and will notify if need to schedule f/u. If normal results, she can plan to continue routine f/u with [name].   Robbie Lis PA-C 10/28/2015 11:56 AM

## 2015-10-28 NOTE — Patient Instructions (Signed)
Medication Instructions:  Your physician recommends that you continue on your current medications as directed. Please refer to the Current Medication list given to you today.   Labwork: Bmet, Bnp today  Testing/Procedures: Your physician has requested that you have a lexiscan myoview. For further information please visit [email or web address] Please follow instruction sheet, as given.    Follow-Up: Your physician recommends that you schedule a follow-up appointment pending test results   Any Other Special Instructions Will Be Listed Below (If Applicable).     If you need a refill on your cardiac medications before your next appointment, please call your pharmacy.

## 2015-10-29 ENCOUNTER — Ambulatory Visit: Payer: Commercial Managed Care - HMO | Admitting: Neurology

## 2015-11-03 ENCOUNTER — Ambulatory Visit (HOSPITAL_COMMUNITY): Payer: BLUE CROSS/BLUE SHIELD | Attending: Cardiovascular Disease

## 2015-11-03 DIAGNOSIS — E119 Type 2 diabetes mellitus without complications: Secondary | ICD-10-CM | POA: Diagnosis not present

## 2015-11-03 DIAGNOSIS — R9439 Abnormal result of other cardiovascular function study: Secondary | ICD-10-CM | POA: Insufficient documentation

## 2015-11-03 DIAGNOSIS — R0602 Shortness of breath: Secondary | ICD-10-CM | POA: Diagnosis not present

## 2015-11-03 DIAGNOSIS — R9431 Abnormal electrocardiogram [ECG] [EKG]: Secondary | ICD-10-CM

## 2015-11-03 DIAGNOSIS — R079 Chest pain, unspecified: Secondary | ICD-10-CM

## 2015-11-03 DIAGNOSIS — I10 Essential (primary) hypertension: Secondary | ICD-10-CM | POA: Insufficient documentation

## 2015-11-03 DIAGNOSIS — R002 Palpitations: Secondary | ICD-10-CM | POA: Insufficient documentation

## 2015-11-03 LAB — MYOCARDIAL PERFUSION IMAGING
CHL CUP RESTING HR STRESS: 58 {beats}/min
CSEPPHR: 95 {beats}/min
LV dias vol: 108 mL (ref 46–106)
LVSYSVOL: 57 mL
NUC STRESS TID: 0.97
RATE: 0.33
SDS: 2
SRS: 4
SSS: 6

## 2015-11-03 MED ORDER — TECHNETIUM TC 99M TETROFOSMIN IV KIT
32.9000 | PACK | Freq: Once | INTRAVENOUS | Status: AC | PRN
Start: 2015-11-03 — End: 2015-11-03
  Administered 2015-11-03: 32.9 via INTRAVENOUS
  Filled 2015-11-03: qty 33

## 2015-11-03 MED ORDER — REGADENOSON 0.4 MG/5ML IV SOLN
0.4000 mg | Freq: Once | INTRAVENOUS | Status: AC
  Administered 2015-11-03: 0.4 mg via INTRAVENOUS

## 2015-11-03 MED ORDER — TECHNETIUM TC 99M TETROFOSMIN IV KIT
11.0000 | PACK | Freq: Once | INTRAVENOUS | Status: AC | PRN
  Administered 2015-11-03: 11 via INTRAVENOUS
  Filled 2015-11-03: qty 11

## 2015-11-04 ENCOUNTER — Telehealth: Payer: Self-pay | Admitting: *Deleted

## 2015-11-04 DIAGNOSIS — I519 Heart disease, unspecified: Secondary | ICD-10-CM

## 2015-11-04 DIAGNOSIS — R079 Chest pain, unspecified: Secondary | ICD-10-CM

## 2015-11-04 NOTE — Telephone Encounter (Signed)
Reviewed stress test results with pt. Advised pt that I will place order for echo and scheduler will call. Pt verbalized understanding and was in agreement with this plan.

## 2015-11-04 NOTE — Telephone Encounter (Signed)
-----   Message from Allayne Butcher, [place] sent at 11/04/2015  4:54 PM EDT ----- No ischemia. No indication for cath at this point. However nuclear study EF is mildly reduce. Check 2D echo to better assess LVF.

## 2015-11-11 ENCOUNTER — Telehealth: Payer: Self-pay | Admitting: Cardiology

## 2015-11-11 NOTE — Telephone Encounter (Signed)
Patient saw [place] PA on 5/24 and states she recommended support stockings She is wanting to know if she needs moderate or firm support stockings, ordering Dr [employer] catalog  Will forward to [place] S PA for recommendations secondary to patients leg pain noted at [facility]

## 2015-11-11 NOTE — Telephone Encounter (Signed)
New message   Pt calling to speak with RN about purchasing the correct compression stockings. Please call back to advise

## 2015-11-24 ENCOUNTER — Ambulatory Visit (HOSPITAL_COMMUNITY): Payer: BLUE CROSS/BLUE SHIELD | Attending: Cardiology

## 2015-11-24 ENCOUNTER — Other Ambulatory Visit: Payer: Self-pay

## 2015-11-24 DIAGNOSIS — Z87891 Personal history of nicotine dependence: Secondary | ICD-10-CM | POA: Diagnosis not present

## 2015-11-24 DIAGNOSIS — I519 Heart disease, unspecified: Secondary | ICD-10-CM | POA: Diagnosis not present

## 2015-11-24 DIAGNOSIS — I509 Heart failure, unspecified: Secondary | ICD-10-CM | POA: Insufficient documentation

## 2015-11-24 DIAGNOSIS — I313 Pericardial effusion (noninflammatory): Secondary | ICD-10-CM | POA: Diagnosis not present

## 2015-11-24 DIAGNOSIS — I2510 Atherosclerotic heart disease of native coronary artery without angina pectoris: Secondary | ICD-10-CM | POA: Diagnosis not present

## 2015-11-24 DIAGNOSIS — E119 Type 2 diabetes mellitus without complications: Secondary | ICD-10-CM | POA: Insufficient documentation

## 2015-11-24 DIAGNOSIS — I110 Hypertensive heart disease with heart failure: Secondary | ICD-10-CM | POA: Diagnosis not present

## 2015-11-24 DIAGNOSIS — I340 Nonrheumatic mitral (valve) insufficiency: Secondary | ICD-10-CM | POA: Diagnosis not present

## 2015-11-24 DIAGNOSIS — E785 Hyperlipidemia, unspecified: Secondary | ICD-10-CM | POA: Diagnosis not present

## 2015-11-24 DIAGNOSIS — R079 Chest pain, unspecified: Secondary | ICD-10-CM | POA: Diagnosis not present

## 2015-11-25 ENCOUNTER — Emergency Department (HOSPITAL_COMMUNITY)
Admission: EM | Admit: 2015-11-25 | Discharge: 2015-12-05 | Disposition: E | Payer: BLUE CROSS/BLUE SHIELD | Attending: Emergency Medicine | Admitting: Emergency Medicine

## 2015-11-25 DIAGNOSIS — Z794 Long term (current) use of insulin: Secondary | ICD-10-CM | POA: Diagnosis not present

## 2015-11-25 DIAGNOSIS — E119 Type 2 diabetes mellitus without complications: Secondary | ICD-10-CM | POA: Diagnosis not present

## 2015-11-25 DIAGNOSIS — Z7982 Long term (current) use of aspirin: Secondary | ICD-10-CM | POA: Insufficient documentation

## 2015-11-25 DIAGNOSIS — Z87891 Personal history of nicotine dependence: Secondary | ICD-10-CM | POA: Insufficient documentation

## 2015-11-25 DIAGNOSIS — J449 Chronic obstructive pulmonary disease, unspecified: Secondary | ICD-10-CM | POA: Diagnosis not present

## 2015-11-25 DIAGNOSIS — I469 Cardiac arrest, cause unspecified: Secondary | ICD-10-CM | POA: Insufficient documentation

## 2015-11-25 DIAGNOSIS — F329 Major depressive disorder, single episode, unspecified: Secondary | ICD-10-CM | POA: Insufficient documentation

## 2015-11-25 DIAGNOSIS — Z96611 Presence of right artificial shoulder joint: Secondary | ICD-10-CM | POA: Diagnosis not present

## 2015-11-25 DIAGNOSIS — I10 Essential (primary) hypertension: Secondary | ICD-10-CM | POA: Diagnosis not present

## 2015-11-25 DIAGNOSIS — Z79899 Other long term (current) drug therapy: Secondary | ICD-10-CM | POA: Diagnosis not present

## 2015-11-25 MED ORDER — EPINEPHRINE HCL 0.1 MG/ML IJ SOSY
PREFILLED_SYRINGE | INTRAMUSCULAR | Status: DC | PRN
  Administered 2015-11-25: 1 mg via INTRAVENOUS

## 2015-11-25 MED ORDER — CALCIUM CHLORIDE 10 % IV SOLN
INTRAVENOUS | Status: DC | PRN
  Administered 2015-11-25: 1 g via INTRAVENOUS

## 2015-11-25 MED ORDER — SODIUM BICARBONATE 8.4 % IV SOLN
INTRAVENOUS | Status: DC | PRN
  Administered 2015-11-25: 50 meq via INTRAVENOUS

## 2015-11-26 ENCOUNTER — Ambulatory Visit: Payer: Self-pay | Admitting: Neurology

## 2015-11-26 MED FILL — Medication: Qty: 1 | Status: AC

## 2015-12-05 NOTE — ED Notes (Signed)
Original call for EMS was CPR in progress.  Family witnessed and started CPR (approx 1905).  Fire arrived within 3-4 mins and continued CPR.  Originally in asystole then PEA  Administered 12 epi's, 2mg  glucagon.  Pupils have been fixed and dilated.   EMS reported she was in PEA, when Samuel Bouche turned off patient without pulse.

## 2015-12-05 NOTE — ED Notes (Signed)
Pulse check - no pulse resumed CPR with [name] Fry

## 2015-12-05 NOTE — ED Provider Notes (Signed)
CSN: [id number]     Arrival date & time 15-Dec-2015  2006 History   First MD Initiated Contact with Patient 12-15-2015 2016     Chief Complaint  Patient presents with  . Cardiac Arrest    Level V caveat: Unresponsive  HPI It is reported by EMS that the patient vomited at home and then had a witnessed arrest.  On EMS arrival the patient was initially found in asystole and after 3-4 minutes of CPR the patient began having PEA.  Patient was given a total of 12 epinephrines and 2 mg of glucagon.  Total CPR time at this time approximately 1 hour.  Noted to have a blood sugar 400 for EMS.  No family available for additional information   Past Medical History  Diagnosis Date  . Diabetes mellitus   . Hypertension 05/07/2012    Normal 2D Echo; Renal Arterial Doppler 02/02/2007 - less than 60% diameter reduction, left proximal renal artery  . Asthma   . COPD (chronic obstructive pulmonary disease) (HCC)   . Multiple sclerosis (HCC)   . Goiter   . Hypertensive hypertrophic cardiomyopathy, by echo 05/07/12 05/08/2012  . Dyspnea 12/12/2005    CHF and Unspecified Chest pain - Myocardial Perfusion - post stress EF-72, negative for ischemia  . Hx of cardiac catheterization     normal cardiac cath  . Chronic diastolic HF (heart failure) (HCC) 10/17/2012  . H/O cardiac catheterization 10/17/2012  . Hypothyroidism   . Depression   . Dyslipidemia   . Chronic low back pain   . Peripheral edema   . Neurogenic bladder   . Obesity   . Gait disorder   . GERD (gastroesophageal reflux disease)   . Arthritis   . Complication of anesthesia     HAS ASTHMA FLARES AFTER MOST SURGERIES  . Pneumonia DEC 2013   Past Surgical History  Procedure Laterality Date  . Cardiac catheterization Left 05/06/2012    Moderate caliber vessel from mid LAD  . Cardiac catheterization Right 04/02/2007    Left ventricular hypertrophy, no coronary disease, no renal artery stenosis  . Cesarean section       3x -[id number] AND 1985  .  Cholecystectomy      1973  . Breast surgery      2 tumors removed -right breast  . Appendectomy      1973  . Arthoscopic rotaor cuff repair Right 1995  . Joint replacement       right shoulder 1985   . Thyroidectomy N/A 11/01/2013    Procedure: THYROIDECTOMY;  Surgeon: Adolph Pollack, MD;  Location: [facility];  Service: General;  Laterality: N/A;  . Left heart catheterization with coronary angiogram N/A 05/07/2012    Procedure: LEFT HEART CATHETERIZATION WITH CORONARY ANGIOGRAM;  Surgeon: Marykay Lex, MD;  Location: [facility] CATH LAB;  Service: Cardiovascular;  Laterality: N/A;   Family History  Problem Relation Age of Onset  . Adopted: Yes  . Coronary artery disease Father 42    MI  . Diabetes Father   . Heart disease Mother   . Diabetes Mother    Social History  Substance Use Topics  . Smoking status: Former Smoker -- 1.00 packs/day for 20 years    Types: Cigarettes    Quit date: 10/24/2003  . Smokeless tobacco: Never Used  . Alcohol Use: No   OB History    No data available     Review of Systems  Unable to perform ROS: Patient unresponsive  Allergies  Codeine; Other; Penicillins; and Tape  Home Medications   Prior to Admission medications   Medication Sig Start Date End Date Taking? Authorizing Provider  amLODipine (NORVASC) 10 MG tablet Take 1 tablet by mouth  daily 05/04/15   Runell Gess, MD  aspirin EC 81 MG tablet Take 81 mg by mouth every morning.    Historical Provider, MD  atorvastatin (LIPITOR) 20 MG tablet Take 1 tablet by mouth at  bedtime 05/04/15   Runell Gess, MD  Celecoxib (CELEBREX PO) Take 200 mg by mouth 2 (two) times daily.     Historical Provider, MD  cloNIDine (CATAPRES) 0.2 MG tablet Take 1 tablet by mouth twice daily and extra daily dose for BP >170/100 07/09/15   Runell Gess, MD  Dimethyl Fumarate (TECFIDERA) 120 & 240 MG MISC Take by mouth.    Historical Provider, MD  EDARBI 80 MG TABS Take 1 tablet by mouth  daily 05/04/15    Runell Gess, MD  fluocinonide cream (LIDEX) 0.05 % Apply 1 application topically 2 (two) times daily.    Historical Provider, MD  fluticasone (FLONASE) 50 MCG/ACT nasal spray Place 2 sprays into the nose daily. 10/23/11   Antonieta Pert, MD  fluticasone (FLOVENT HFA) 220 MCG/ACT inhaler Inhale 2 puffs into the lungs 2 (two) times daily.    Historical Provider, MD  furosemide (LASIX) 40 MG tablet Take 1 tablet by mouth  daily 04/20/15   Runell Gess, MD  hydrALAZINE (APRESOLINE) 100 MG tablet Take 1 tablet by mouth 3  times daily 04/20/15   Runell Gess, MD  insulin lispro protamine-lispro (HUMALOG 75/25 MIX) (75-25) 100 UNIT/ML SUSP injection Inject 60 Units into the skin every morning.    Historical Provider, MD  levothyroxine (SYNTHROID, LEVOTHROID) 175 MCG tablet Take 175 mcg by mouth daily before breakfast.    Historical Provider, MD  liothyronine (CYTOMEL) 25 MCG tablet Take 50 mcg by mouth 2 (two) times daily.    Historical Provider, MD  metoprolol (LOPRESSOR) 100 MG tablet Take 1 tablet by mouth two  times daily 05/04/15   Runell Gess, MD  pantoprazole (PROTONIX) 40 MG tablet Take 1 tablet by mouth  daily 06/24/15   Runell Gess, MD  spironolactone (ALDACTONE) 25 MG tablet Take 25 mg by mouth daily.    Historical Provider, MD  traZODone (DESYREL) 100 MG tablet Take 2 tablets by mouth at  bedtime 07/07/15   York Spaniel, MD   BP  Physical Exam  Constitutional: She appears well-developed and well-nourished.  HENT:  Head: Atraumatic.  Eyes:  Fixed and dilated  Neck: Neck supple.  No crepitus  Cardiovascular:  Pulses present with CPR.  Absent cardiac sounds  Pulmonary/Chest:  Bilateral breath sounds present with bag tube ventilation  Abdominal: She exhibits no distension.  Genitourinary:  Normal external genitalia  Musculoskeletal:  No deformity of extremities  Neurological:  GCS 3  Skin: Skin is warm. No rash noted.  Psychiatric:  Unable to test     ED Course  Procedures (including critical care time)   ++++++++++++++++++++++++++++++++++++++++++++++++  Cardiopulmonary Resuscitation (CPR) Procedure Note Directed/Performed by: Lyanne Co I personally directed ancillary staff and/or performed CPR in an effort to regain return of spontaneous circulation and to maintain cardiac, neuro and systemic perfusion.       EMERGENCY DEPARTMENT [place] CARDIAC EXAM "Study: Limited Ultrasound of the heart and pericardium"  INDICATIONS:Cardiac arrest Multiple views of the heart and pericardium are obtained with  a multi-frequency probe.  PERFORMED ZO:XWRUEA  IMAGES ARCHIVED?: Yes  FINDINGS: No pericardial effusion and No cardiac activity  LIMITATIONS:  Body habitus  VIEWS USED: Subcostal 4 chamber  INTERPRETATION: Cardiac activity absent and Pericardial effusioin absent  COMMENT:  No cardiac activity   +++++++++++++++++++++++++++++++++++++++++++++++++++++++   Labs Review Labs Reviewed - No data to display  Imaging Review No results found. I have personally reviewed and evaluated these images and lab results as part of my medical decision-making.   EKG Interpretation None      MDM   Final diagnoses:  None  Despite standard ACLS by EMS as well as epi, bicarbonate, calcium and CPR on arrival of emergency department no return of spontaneous circulation was able to be obtained.  She has had prolonged CPR this time a 1 hour and was felt as though her likelihood of reasonable outcome was very low and thus the code was called at 2010 PM.  Bedside ultrasound with no cardiac activity    Azalia Bilis, MD 14-Dec-2015 2033

## 2015-12-05 NOTE — ED Notes (Signed)
Eye care done prior to taking to the morgue

## 2015-12-05 NOTE — Progress Notes (Signed)
Chaplain responded to page from ED to support pt's husband. I found pt's husband in ED waiting area, accompanied him to consult B, and offered supportive conversation. I brought Dr. Patria Mane to consult B and he informed pt's husband that pt had died. Both Dr. Patria Mane and I were supportive of pt's husband as he assimilated this news. When RN was ready, I escorted pt's husband to Trauma A to spend time with pt. I verified his contact info and gave to RN. Also gave him the phone number for Patient Placement. He said he would call tomorrow after he decides on which funeral home to use. When he was ready to go, chaplain accompanied him out of the dept.

## 2015-12-05 NOTE — ED Notes (Signed)
Pulse check - no pulse Time of death 2008/10/08 per Dr Patria Mane  Patient is not a ME case

## 2015-12-05 DEATH — deceased

## 2016-01-20 ENCOUNTER — Ambulatory Visit: Payer: BLUE CROSS/BLUE SHIELD | Admitting: Cardiovascular Disease

## 2016-03-28 ENCOUNTER — Ambulatory Visit: Payer: BLUE CROSS/BLUE SHIELD | Admitting: Neurology
